# Patient Record
Sex: Female | Born: 1945 | Race: White | Hispanic: No | Smoking: Never smoker
Health system: Southern US, Community
[De-identification: ages and names within clinical notes are randomized; demographics above are authoritative.]

## PROBLEM LIST (undated history)

## (undated) DIAGNOSIS — N200 Calculus of kidney: Secondary | ICD-10-CM

## (undated) DIAGNOSIS — Z87442 Personal history of urinary calculi: Secondary | ICD-10-CM

## (undated) DIAGNOSIS — I471 Supraventricular tachycardia: Principal | ICD-10-CM

## (undated) DIAGNOSIS — I499 Cardiac arrhythmia, unspecified: Secondary | ICD-10-CM

## (undated) DIAGNOSIS — R413 Other amnesia: Secondary | ICD-10-CM

## (undated) DIAGNOSIS — M1990 Unspecified osteoarthritis, unspecified site: Secondary | ICD-10-CM

## (undated) DIAGNOSIS — R87610 Atypical squamous cells of undetermined significance on cytologic smear of cervix (ASC-US): Secondary | ICD-10-CM

## (undated) DIAGNOSIS — Z860100 Personal history of colon polyps, unspecified: Secondary | ICD-10-CM

## (undated) DIAGNOSIS — E039 Hypothyroidism, unspecified: Secondary | ICD-10-CM

## (undated) DIAGNOSIS — K7689 Other specified diseases of liver: Secondary | ICD-10-CM

## (undated) HISTORY — DX: Other amnesia: R41.3

## (undated) HISTORY — DX: Personal history of colonic polyps: Z86.010

## (undated) HISTORY — PX: SMALL INTESTINE SURGERY: SHX150

## (undated) HISTORY — DX: Hypothyroidism, unspecified: E03.9

## (undated) HISTORY — DX: Personal history of colon polyps, unspecified: Z86.0100

## (undated) HISTORY — PX: MM BREAST STEREO BIOPSY LEFT (ARMC HX): HXRAD1824

## (undated) HISTORY — PX: BACK SURGERY: SHX140

## (undated) HISTORY — DX: Supraventricular tachycardia: I47.1

## (undated) HISTORY — DX: Calculus of kidney: N20.0

## (undated) HISTORY — DX: Atypical squamous cells of undetermined significance on cytologic smear of cervix (ASC-US): R87.610

## (undated) HISTORY — DX: Other specified diseases of liver: K76.89

## (undated) HISTORY — DX: Unspecified osteoarthritis, unspecified site: M19.90

## (undated) HISTORY — PX: ABLATION: SHX5711

---

## 1998-06-20 ENCOUNTER — Other Ambulatory Visit: Admission: RE | Admit: 1998-06-20 | Discharge: 1998-06-20 | Payer: Self-pay | Admitting: *Deleted

## 1998-12-31 ENCOUNTER — Other Ambulatory Visit: Admission: RE | Admit: 1998-12-31 | Discharge: 1998-12-31 | Payer: Self-pay | Admitting: *Deleted

## 1999-01-02 ENCOUNTER — Other Ambulatory Visit: Admission: RE | Admit: 1999-01-02 | Discharge: 1999-01-02 | Payer: Self-pay | Admitting: Obstetrics and Gynecology

## 1999-06-26 ENCOUNTER — Other Ambulatory Visit: Admission: RE | Admit: 1999-06-26 | Discharge: 1999-06-26 | Payer: Self-pay | Admitting: *Deleted

## 2000-01-29 ENCOUNTER — Other Ambulatory Visit: Admission: RE | Admit: 2000-01-29 | Discharge: 2000-01-29 | Payer: Self-pay | Admitting: *Deleted

## 2000-09-09 ENCOUNTER — Other Ambulatory Visit: Admission: RE | Admit: 2000-09-09 | Discharge: 2000-09-09 | Payer: Self-pay | Admitting: *Deleted

## 2000-12-29 ENCOUNTER — Other Ambulatory Visit: Admission: RE | Admit: 2000-12-29 | Discharge: 2000-12-29 | Payer: Self-pay | Admitting: Radiology

## 2000-12-29 ENCOUNTER — Encounter (INDEPENDENT_AMBULATORY_CARE_PROVIDER_SITE_OTHER): Payer: Self-pay

## 2001-04-01 ENCOUNTER — Other Ambulatory Visit: Admission: RE | Admit: 2001-04-01 | Discharge: 2001-04-01 | Payer: Self-pay | Admitting: *Deleted

## 2001-10-03 ENCOUNTER — Other Ambulatory Visit: Admission: RE | Admit: 2001-10-03 | Discharge: 2001-10-03 | Payer: Self-pay | Admitting: *Deleted

## 2001-12-06 ENCOUNTER — Encounter: Payer: Self-pay | Admitting: Neurosurgery

## 2001-12-08 ENCOUNTER — Inpatient Hospital Stay (HOSPITAL_COMMUNITY): Admission: RE | Admit: 2001-12-08 | Discharge: 2001-12-11 | Payer: Self-pay | Admitting: Neurosurgery

## 2001-12-08 ENCOUNTER — Encounter: Payer: Self-pay | Admitting: Neurosurgery

## 2002-03-01 ENCOUNTER — Other Ambulatory Visit: Admission: RE | Admit: 2002-03-01 | Discharge: 2002-03-01 | Payer: Self-pay | Admitting: *Deleted

## 2002-03-21 ENCOUNTER — Encounter: Admission: RE | Admit: 2002-03-21 | Discharge: 2002-04-06 | Payer: Self-pay | Admitting: Neurosurgery

## 2002-10-06 ENCOUNTER — Ambulatory Visit (HOSPITAL_COMMUNITY): Admission: RE | Admit: 2002-10-06 | Discharge: 2002-10-06 | Payer: Self-pay | Admitting: Gastroenterology

## 2002-10-06 ENCOUNTER — Encounter (INDEPENDENT_AMBULATORY_CARE_PROVIDER_SITE_OTHER): Payer: Self-pay | Admitting: Specialist

## 2002-11-07 ENCOUNTER — Inpatient Hospital Stay (HOSPITAL_COMMUNITY): Admission: AD | Admit: 2002-11-07 | Discharge: 2002-11-11 | Payer: Self-pay | Admitting: Internal Medicine

## 2002-11-07 ENCOUNTER — Encounter: Payer: Self-pay | Admitting: Internal Medicine

## 2003-06-25 ENCOUNTER — Encounter: Admission: RE | Admit: 2003-06-25 | Discharge: 2003-06-25 | Payer: Self-pay | Admitting: Gastroenterology

## 2003-07-14 HISTORY — PX: PARTIAL COLECTOMY: SHX5273

## 2003-07-23 ENCOUNTER — Encounter (INDEPENDENT_AMBULATORY_CARE_PROVIDER_SITE_OTHER): Payer: Self-pay | Admitting: *Deleted

## 2003-07-23 ENCOUNTER — Inpatient Hospital Stay (HOSPITAL_COMMUNITY): Admission: RE | Admit: 2003-07-23 | Discharge: 2003-07-27 | Payer: Self-pay | Admitting: General Surgery

## 2004-06-10 ENCOUNTER — Emergency Department (HOSPITAL_COMMUNITY): Admission: EM | Admit: 2004-06-10 | Discharge: 2004-06-10 | Payer: Self-pay | Admitting: Family Medicine

## 2006-03-01 ENCOUNTER — Encounter: Admission: RE | Admit: 2006-03-01 | Discharge: 2006-03-01 | Payer: Self-pay | Admitting: Internal Medicine

## 2006-05-17 ENCOUNTER — Encounter: Admission: RE | Admit: 2006-05-17 | Discharge: 2006-05-17 | Payer: Self-pay | Admitting: Internal Medicine

## 2006-05-31 ENCOUNTER — Encounter (INDEPENDENT_AMBULATORY_CARE_PROVIDER_SITE_OTHER): Payer: Self-pay | Admitting: *Deleted

## 2006-05-31 ENCOUNTER — Other Ambulatory Visit: Admission: RE | Admit: 2006-05-31 | Discharge: 2006-05-31 | Payer: Self-pay | Admitting: Interventional Radiology

## 2006-05-31 ENCOUNTER — Encounter: Admission: RE | Admit: 2006-05-31 | Discharge: 2006-05-31 | Payer: Self-pay | Admitting: Internal Medicine

## 2006-06-21 ENCOUNTER — Encounter: Admission: RE | Admit: 2006-06-21 | Discharge: 2006-06-21 | Payer: Self-pay | Admitting: Internal Medicine

## 2006-07-08 ENCOUNTER — Encounter (INDEPENDENT_AMBULATORY_CARE_PROVIDER_SITE_OTHER): Payer: Self-pay | Admitting: *Deleted

## 2006-07-08 ENCOUNTER — Ambulatory Visit (HOSPITAL_COMMUNITY): Admission: RE | Admit: 2006-07-08 | Discharge: 2006-07-08 | Payer: Self-pay | Admitting: *Deleted

## 2006-07-13 HISTORY — PX: THYROIDECTOMY: SHX17

## 2006-08-26 ENCOUNTER — Ambulatory Visit (HOSPITAL_COMMUNITY): Admission: RE | Admit: 2006-08-26 | Discharge: 2006-08-27 | Payer: Self-pay | Admitting: Surgery

## 2006-08-26 ENCOUNTER — Encounter (INDEPENDENT_AMBULATORY_CARE_PROVIDER_SITE_OTHER): Payer: Self-pay | Admitting: Specialist

## 2007-09-11 LAB — CONVERTED CEMR LAB: Pap Smear: NORMAL

## 2008-06-25 ENCOUNTER — Encounter (INDEPENDENT_AMBULATORY_CARE_PROVIDER_SITE_OTHER): Payer: Self-pay | Admitting: *Deleted

## 2008-07-02 ENCOUNTER — Ambulatory Visit: Payer: Self-pay | Admitting: Family Medicine

## 2008-07-02 DIAGNOSIS — E039 Hypothyroidism, unspecified: Secondary | ICD-10-CM | POA: Insufficient documentation

## 2008-07-02 DIAGNOSIS — M1990 Unspecified osteoarthritis, unspecified site: Secondary | ICD-10-CM | POA: Insufficient documentation

## 2008-07-02 DIAGNOSIS — Z87442 Personal history of urinary calculi: Secondary | ICD-10-CM | POA: Insufficient documentation

## 2008-07-05 ENCOUNTER — Ambulatory Visit: Payer: Self-pay | Admitting: Family Medicine

## 2008-07-05 LAB — CONVERTED CEMR LAB: LDL Cholesterol: 148 mg/dL

## 2008-07-09 LAB — CONVERTED CEMR LAB
ALT: 22 units/L (ref 0–35)
AST: 19 units/L (ref 0–37)
Albumin: 4 g/dL (ref 3.5–5.2)
Alkaline Phosphatase: 56 units/L (ref 39–117)
BUN: 22 mg/dL (ref 6–23)
Bilirubin, Direct: 0.1 mg/dL (ref 0.0–0.3)
CO2: 26 meq/L (ref 19–32)
Calcium: 9.4 mg/dL (ref 8.4–10.5)
Chloride: 106 meq/L (ref 96–112)
Cholesterol, target level: 200 mg/dL
Cholesterol: 227 mg/dL (ref 0–200)
Creatinine, Ser: 0.8 mg/dL (ref 0.4–1.2)
Direct LDL: 148 mg/dL
Free T4: 1.3 ng/dL (ref 0.6–1.6)
GFR calc Af Amer: 93 mL/min
GFR calc non Af Amer: 77 mL/min
Glucose, Bld: 110 mg/dL — ABNORMAL HIGH (ref 70–99)
HDL goal, serum: 40 mg/dL
HDL: 54.2 mg/dL (ref >39.0)
LDL Goal: 160 mg/dL
Potassium: 4.5 meq/L (ref 3.5–5.1)
Sodium: 141 meq/L (ref 135–145)
T4, Total: 8.6 ug/dL (ref 5.0–12.5)
TSH: 0.63 microintl units/mL (ref 0.35–5.50)
Total Bilirubin: 0.8 mg/dL (ref 0.3–1.2)
Total CHOL/HDL Ratio: 4.2
Total Protein: 6.4 g/dL (ref 6.0–8.3)
Triglycerides: 70 mg/dL (ref 0–149)
VLDL: 14 mg/dL (ref 0–40)

## 2008-09-10 LAB — CONVERTED CEMR LAB: Pap Smear: NORMAL

## 2008-12-19 ENCOUNTER — Ambulatory Visit: Payer: Self-pay | Admitting: Family Medicine

## 2008-12-19 DIAGNOSIS — E785 Hyperlipidemia, unspecified: Secondary | ICD-10-CM | POA: Insufficient documentation

## 2008-12-20 LAB — CONVERTED CEMR LAB
Cholesterol: 200 mg/dL (ref 0–200)
HDL: 55.1 mg/dL (ref >39.00)
LDL Cholesterol: 131 mg/dL — ABNORMAL HIGH (ref 0–99)
Total CHOL/HDL Ratio: 4
Triglycerides: 71 mg/dL (ref 0.0–149.0)
VLDL: 14.2 mg/dL (ref 0.0–40.0)

## 2009-06-13 ENCOUNTER — Ambulatory Visit: Payer: Self-pay | Admitting: Family Medicine

## 2009-06-14 LAB — CONVERTED CEMR LAB
BUN: 21 mg/dL (ref 6–23)
CO2: 29 meq/L (ref 19–32)
Calcium: 9.1 mg/dL (ref 8.4–10.5)
Chloride: 107 meq/L (ref 96–112)
Cholesterol: 217 mg/dL — ABNORMAL HIGH (ref 0–200)
Creatinine, Ser: 0.9 mg/dL (ref 0.4–1.2)
Direct LDL: 151.7 mg/dL
Free T4: 1.1 ng/dL (ref 0.6–1.6)
GFR calc non Af Amer: 66.68 mL/min (ref >60)
Glucose, Bld: 110 mg/dL — ABNORMAL HIGH (ref 70–99)
HDL: 53.6 mg/dL (ref >39.00)
Potassium: 4.6 meq/L (ref 3.5–5.1)
Sodium: 142 meq/L (ref 135–145)
T4, Total: 7.2 ug/dL (ref 5.0–12.5)
TSH: 0.17 microintl units/mL — ABNORMAL LOW (ref 0.35–5.50)
Total CHOL/HDL Ratio: 4
Triglycerides: 84 mg/dL (ref 0.0–149.0)
VLDL: 16.8 mg/dL (ref 0.0–40.0)

## 2009-06-19 ENCOUNTER — Ambulatory Visit: Payer: Self-pay | Admitting: Family Medicine

## 2009-08-16 ENCOUNTER — Telehealth (INDEPENDENT_AMBULATORY_CARE_PROVIDER_SITE_OTHER): Payer: Self-pay

## 2009-08-23 ENCOUNTER — Ambulatory Visit: Payer: Self-pay | Admitting: Family Medicine

## 2009-08-26 LAB — CONVERTED CEMR LAB
Free T4: 1.1 ng/dL (ref 0.6–1.6)
TSH: 0.61 microintl units/mL (ref 0.35–5.50)

## 2009-08-29 ENCOUNTER — Ambulatory Visit: Payer: Self-pay | Admitting: Family Medicine

## 2009-08-29 DIAGNOSIS — G4700 Insomnia, unspecified: Secondary | ICD-10-CM | POA: Insufficient documentation

## 2009-09-05 ENCOUNTER — Ambulatory Visit: Payer: Self-pay | Admitting: Family Medicine

## 2009-11-08 ENCOUNTER — Ambulatory Visit: Payer: Self-pay | Admitting: Family Medicine

## 2010-04-18 ENCOUNTER — Ambulatory Visit: Payer: Self-pay | Admitting: Internal Medicine

## 2010-04-21 ENCOUNTER — Ambulatory Visit: Payer: Self-pay | Admitting: Family Medicine

## 2010-04-25 ENCOUNTER — Ambulatory Visit: Payer: Self-pay | Admitting: Family Medicine

## 2010-05-13 LAB — CONVERTED CEMR LAB: Pap Smear: NORMAL

## 2010-05-13 LAB — HM PAP SMEAR

## 2010-05-19 ENCOUNTER — Ambulatory Visit: Payer: Self-pay | Admitting: Family Medicine

## 2010-05-19 LAB — CONVERTED CEMR LAB
ALT: 26 units/L (ref 0–35)
AST: 21 units/L (ref 0–37)
Albumin: 4 g/dL (ref 3.5–5.2)
Alkaline Phosphatase: 59 units/L (ref 39–117)
BUN: 21 mg/dL (ref 6–23)
Basophils Absolute: 0 10*3/uL (ref 0.0–0.1)
Basophils Relative: 0.6 % (ref 0.0–3.0)
Bilirubin, Direct: 0.1 mg/dL (ref 0.0–0.3)
CO2: 29 meq/L (ref 19–32)
Calcium: 9.6 mg/dL (ref 8.4–10.5)
Chloride: 107 meq/L (ref 96–112)
Cholesterol: 224 mg/dL — ABNORMAL HIGH (ref 0–200)
Creatinine, Ser: 0.9 mg/dL (ref 0.4–1.2)
Direct LDL: 161.5 mg/dL
Eosinophils Absolute: 0.1 10*3/uL (ref 0.0–0.7)
Eosinophils Relative: 2.3 % (ref 0.0–5.0)
Free T4: 1.02 ng/dL (ref 0.60–1.60)
GFR calc non Af Amer: 68.23 mL/min (ref >60)
Glucose, Bld: 115 mg/dL — ABNORMAL HIGH (ref 70–99)
HCT: 44.2 % (ref 36.0–46.0)
HDL: 54.7 mg/dL (ref >39.00)
Hemoglobin: 15.2 g/dL — ABNORMAL HIGH (ref 12.0–15.0)
Lymphocytes Relative: 18.7 % (ref 12.0–46.0)
Lymphs Abs: 0.9 10*3/uL (ref 0.7–4.0)
MCHC: 34.5 g/dL (ref 30.0–36.0)
MCV: 95.8 fL (ref 78.0–100.0)
Monocytes Absolute: 0.4 10*3/uL (ref 0.1–1.0)
Monocytes Relative: 8.1 % (ref 3.0–12.0)
Neutro Abs: 3.5 10*3/uL (ref 1.4–7.7)
Neutrophils Relative %: 70.3 % (ref 43.0–77.0)
Platelets: 210 10*3/uL (ref 150.0–400.0)
Potassium: 5.2 meq/L — ABNORMAL HIGH (ref 3.5–5.1)
RBC: 4.61 M/uL (ref 3.87–5.11)
RDW: 12.7 % (ref 11.5–14.6)
Sodium: 143 meq/L (ref 135–145)
TSH: 0.95 microintl units/mL (ref 0.35–5.50)
Total Bilirubin: 0.6 mg/dL (ref 0.3–1.2)
Total CHOL/HDL Ratio: 4
Total Protein: 6.2 g/dL (ref 6.0–8.3)
Triglycerides: 75 mg/dL (ref 0.0–149.0)
VLDL: 15 mg/dL (ref 0.0–40.0)
WBC: 4.9 10*3/uL (ref 4.5–10.5)

## 2010-05-20 LAB — CONVERTED CEMR LAB: T4, Total: 7.4 ug/dL (ref 5.0–12.5)

## 2010-05-22 ENCOUNTER — Ambulatory Visit: Payer: Self-pay | Admitting: Family Medicine

## 2010-07-24 ENCOUNTER — Encounter: Payer: Self-pay | Admitting: Family Medicine

## 2010-07-24 ENCOUNTER — Ambulatory Visit
Admission: RE | Admit: 2010-07-24 | Discharge: 2010-07-24 | Payer: Self-pay | Source: Home / Self Care | Attending: Family Medicine | Admitting: Family Medicine

## 2010-07-24 DIAGNOSIS — F3342 Major depressive disorder, recurrent, in full remission: Secondary | ICD-10-CM

## 2010-07-24 DIAGNOSIS — M810 Age-related osteoporosis without current pathological fracture: Secondary | ICD-10-CM | POA: Insufficient documentation

## 2010-07-24 HISTORY — DX: Major depressive disorder, recurrent, in full remission: F33.42

## 2010-08-14 NOTE — Assessment & Plan Note (Signed)
Summary: Trouble sleeping/hmw   Vital Signs:  Patient profile:   65 year old female Height:      65 inches Weight:      164.2 pounds BMI:     27.42 Temp:     98.2 degrees F oral Pulse rate:   72 / minute Pulse rhythm:   regular BP sitting:   110 / 68  (left arm) Cuff size:   regular  Vitals Entered By: Benny Lennert CMA Duncan Dull) (August 29, 2009 10:25 AM)  History of Present Illness: Chief complaint trouble sleeping  65 year old female:  L 3rd finger with some rash - put some Vicks vaporub on it. this is been present for approximately 3 weeks. She has no known new exposures. This is not itching. It is approximately the size of a dime. There is some scale present.  Good night will sleep 6 hours, a and a bad night will be four Tried some Tylenol PM but will wake up drwosy in the AM. patient does watch TV in bed. She has taken some Benadryl or Tylenol PM approximately 1/2 tablets at nighttime, this does leave her drowsy in the morning.  Allergies (verified): No Known Drug Allergies  Past History:  Past medical, surgical, family and social histories (including risk factors) reviewed, and no changes noted (except as noted below).  Past Medical History: Reviewed history from 07/02/2008 and no changes required. Hypothyroidism Nephrolithiasis, hx of Osteoarthritis h/o Colon polyps  OB/GYN - Noland Fordyce, [facility] OB Urology - Larey Dresser, [facility]  Past Surgical History: Reviewed history from 07/02/2008 and no changes required. 3 back surgeries, 1982, 1987, 2003 Partial colectomy, 2005 Thyroidectomy, 2008  Family History: Reviewed history from 07/02/2008 and no changes required. M, d/c Alz, CVA F, d/c 89 natural causes  OA CAD, others Colon CA  Social History: Reviewed history from 07/02/2008 and no changes required. Divorced 40 years 1 daughter, no grandchildren Retired [employer]. Never  Smoked Alcohol use-no Drug use-no Regular exercise-yes  Review of Systems       ROS: GEN: No acute illnesses, no fevers, chills, sweats, fatigue, weight loss, or URI sx. GI: No n/v/d Pulm: No SOB, cough, wheezing Interactive and getting along well at home.  Otherwise, ROS is as per the HPI.   Physical Exam  General:  GEN: Well-developed,well-nourished,in no acute distress; alert,appropriate and cooperative throughout examination HEENT: Normocephalic and atraumatic without obvious abnormalities. No apparent alopecia or balding. Ears, externally no deformities PULM: Breathing comfortably in no respiratory distress EXT: No clubbing, cyanosis, or edema PSYCH: Normally interactive. Cooperative during the interview. Pleasant. Friendly and conversant. Not anxious or depressed appearing. Normal, full affect.  Skin:  left third finger, dorsum, with an area approximately the size of a dime with some scaling present. There is no evidence for significant excoriation. Cervical Nodes:  No lymphadenopathy noted   Impression & Recommendations:  Problem # 1:  INSOMNIA (ICD-780.52) Assessment New  Discussed sleep hygiene.   trazodone p.o. p.r.n.  Problem # 2:  RASH-NONVESICULAR (ICD-782.1) Assessment: New scaly rash, nonpruritic. Looks most consistent with a fungal rash, topical clotrimazole, and if no improvement three out of four weeks, it is reasonable to try topical steroids  Complete Medication List: 1)  Levothyroxine Sodium 88 Mcg Tabs (Levothyroxine sodium) .[name].. 1 by mouth daily 2)  Trazodone Hcl 100 Mg Tabs (Trazodone hcl) .[name].. 1 by mouth at bedtime as needed insomnia  Patient Instructions: 1)  Melatonin 3-5 mg by mouth before  bed, 1 hour before 2)  Over the counter, CLOTRIMAZOLE twice a day Prescriptions: TRAZODONE HCL 100 MG TABS (TRAZODONE HCL) 1 by mouth at bedtime as needed insomnia  #30 x 3   Entered and Authorized by:   Hannah Beat MD   Signed by:   Hannah Beat  MD on 08/29/2009   Method used:   Electronically to        [facility]* (retail)       [address]       [place], [place]  [address]       Ph: [phone]       Fax: [phone]   RxID:   [id number]   Current Allergies (reviewed today): No known allergies

## 2010-08-14 NOTE — Assessment & Plan Note (Signed)
Summary: FOLLOWUP  STITCHES... CYD   Allergies: 1)  ! Asa   Impression & Recommendations:  Problem # 1:  WOUND, OPEN, ARM, WITHOUT COMPLICATION (ICD-884.0) Assessment Improved  Suture repair, some opening proximally with good granulation tissue. no evidence of warmth, redness or infection. reassured pt. f/u Friday with Dr. Reece Agar for suture removal (Dr. Salena Saner on vacation)  discussed indications for eval over weekend (infection), o/w f/u Monday.  Last tetanus 2003 - she declines Tdap for pertussis  Orders: No Charge Patient Arrived (NCPA0) (NCPA0)  Complete Medication List: 1)  Levothyroxine Sodium 88 Mcg Tabs (Levothyroxine sodium) .[name].. 1 by mouth daily 2)  Glucosamine-chondroitin 500-400 Mg Caps (Glucosamine-chondroitin) .... One daily 3)  Calcium + D 600-200 Mg-unit Tabs (Calcium carbonate-vitamin d) .... Two a day 4)  Fish Oil 1000 Mg Caps (Omega-3 fatty acids) .... Two a day 5)  Keflex 500 Mg Caps (Cephalexin) .... Take one two times a day x 10 days

## 2010-08-14 NOTE — Assessment & Plan Note (Signed)
Summary: CHECK KNOT ON BACK OF NECK/CLE   Vital Signs:  Patient profile:   65 year old female Height:      65 inches Weight:      164.2 pounds BMI:     27.42 Temp:     97.9 degrees F oral Pulse rate:   72 / minute Pulse rhythm:   regular BP sitting:   130 / 80  (left arm) Cuff size:   regular  Vitals Entered By: Benny Lennert CMA Duncan Dull) (September 05, 2009 3:36 PM) 2  History of Present Illness: Chief complaint check know on back of neck  Boil, posterior neck in hairline  the last few days, the patient has had a very painful area in the posterior  neck, just caudal to the hairline. This is tender, is approximately the size of a dime, it is red. There's not been any pus that is come from this lesion.  She has not had any fever, chills, sweats or any systemic  symptoms.   ros: otherwise, the patient generally feels okay. She has no cough, rhinorrhea,  URI symptoms, flu symptoms, nausea, vomiting, diarrhea.  She feels generally a cane is breathing normally.     Allergies (verified): No Known Drug Allergies  Past History:  Past medical, surgical, family and social histories (including risk factors) reviewed, and no changes noted (except as noted below).  Past Medical History: Reviewed history from 07/02/2008 and no changes required. Hypothyroidism Nephrolithiasis, hx of Osteoarthritis h/o Colon polyps  OB/GYN - Noland Fordyce, [facility] OB Urology - Larey Dresser, [facility]  Past Surgical History: Reviewed history from 07/02/2008 and no changes required. 3 back surgeries, 1982, 1987, 2003 Partial colectomy, 2005 Thyroidectomy, 2008  Family History: Reviewed history from 07/02/2008 and no changes required. M, d/c Alz, CVA F, d/c 89 natural causes  OA CAD, others Colon CA  Social History: Reviewed history from 07/02/2008 and no changes required. Divorced 40 years 1 daughter, no grandchildren Retired [employer]. Never Smoked Alcohol use-no Drug use-no Regular exercise-yes  Physical Exam  General:  Well-developed,well-nourished,in no acute distress; alert,appropriate and cooperative throughout examination Head:  normocephalic and atraumatic.   Eyes:  vision grossly intact.   Ears:  no external deformities.   Nose:  no external deformity.   Lungs:  normal respiratory effort.   Extremities:  no edema Neurologic:  alert & oriented X3 and gait normal.   Skin:  posterior, medial aspect of the neck, approximately 1 cm above the hairline in the back there is an elevated  indurated lesion  that is red.. No fluctuance is appreciated. Cervical Nodes:  No lymphadenopathy noted Psych:  Cognition and judgment appear intact. Alert and cooperative with normal attention span and concentration. No apparent delusions, illusions, hallucinations   Impression & Recommendations:  Problem # 1:  CELLULITIS AND ABSCESS OF NECK (ICD-682.1) Assessment New  High risk location in nape of neck I and D and ABX with MRSA coverage  Procedure, Incision and drainage: the patient was prepped with  alcohol x5, subsequently to  2 cc of  lidocaine 2% was used to anesthetize the area in a wheal  fashion. good anesthesia was achieved.  Subsequently the area was reprepped, and an 18-gauge needle and a 3 cc syringe was used to aspirate the area..  Some small degree of purulent material was aspirated.  Subsequently, a #11 blade was used to enter the  abscess, open abcess, and express slightly more purulent material. Overall, less  than 1 cc was expressed.  Good hemostasis with pressure.  f/u if symptoms return or worsen.  Her updated medication list for this problem includes:    Septra Ds 800-160 Mg Tabs (Sulfamethoxazole-trimethoprim) .[name]... 2 by mouth two times a day  Orders: I&D Abscess, Simple / Single (10060)  Complete Medication List: 1)  Levothyroxine Sodium 88 Mcg Tabs (Levothyroxine sodium) .[name].. 1 by mouth  daily 2)  Trazodone Hcl 100 Mg Tabs (Trazodone hcl) .[name].. 1 by mouth at bedtime as needed insomnia 3)  Septra Ds 800-160 Mg Tabs (Sulfamethoxazole-trimethoprim) .... 2 by mouth two times a day Prescriptions: SEPTRA DS 800-160 MG TABS (SULFAMETHOXAZOLE-TRIMETHOPRIM) 2 by mouth two times a day  #28 x 0   Entered and Authorized by:   Hannah Beat MD   Signed by:   Hannah Beat MD on 09/05/2009   Method used:   Electronically to        [facility]* (retail)       [address]       [place], [place]  [address]       Ph: [phone]       Fax: [phone]   RxID:   [id number]   Current Allergies (reviewed today): No known allergies

## 2010-08-14 NOTE — Assessment & Plan Note (Signed)
Summary: REMOVE STICHES PER DR.COPLAND/JRR   Vital Signs:  Patient profile:   65 year old female Weight:      166.75 pounds Temp:     98.2 degrees F oral Pulse rate:   60 / minute Pulse rhythm:   regular BP sitting:   132 / 80  (left arm) Cuff size:   large  Vitals Entered By: Selena Batten Dance CMA Duncan Dull) (April 25, 2010 11:49 AM) CC: Remove stiches   Allergies: 1)  ! Asa  Physical Exam  General:  Well-developed,well-nourished,in no acute distress; alert,appropriate and cooperative throughout examination   Impression & Recommendations:  Problem # 1:  WOUND, OPEN, ARM, WITHOUT COMPLICATION (ICD-884.0) Sutures removed x 5.  pt tolerated well.  red flags for infection discussed.  good granulation tissue laterally where abrasion was.  dog ear medial flap proximally looking intact, distally dark, may not take.  return as needed.  advised to keep covered, clean and dry.  Orders: No Charge Patient Arrived (NCPA0) (NCPA0)  Complete Medication List: 1)  Levothyroxine Sodium 88 Mcg Tabs (Levothyroxine sodium) .[name].. 1 by mouth daily 2)  Glucosamine-chondroitin 500-400 Mg Caps (Glucosamine-chondroitin) .... One daily 3)  Calcium + D 600-200 Mg-unit Tabs (Calcium carbonate-vitamin d) .... Two a day 4)  Fish Oil 1000 Mg Caps (Omega-3 fatty acids) .... Two a day 5)  Keflex 500 Mg Caps (Cephalexin) .... Take one two times a day x 10 days  Patient Instructions: 1)  stitches removed.  keep area clean and dry.  good to see you today. 2)  call us if redness worsening or if starts draining pus.  Current Allergies (reviewed today): ! ASA

## 2010-08-14 NOTE — Assessment & Plan Note (Signed)
Summary: DEPRESSION,CONGESTION/CLE   Vital Signs:  Patient profile:   65 year old female Height:      65 inches Weight:      165.50 pounds BMI:     27.64 Temp:     98.2 degrees F oral Pulse rate:   64 / minute Pulse rhythm:   regular BP sitting:   120 / 78  (left arm) Cuff size:   regular  Vitals Entered By: Benny Lennert CMA Duncan Dull) (July 24, 2010 4:00 PM)  History of Present Illness: Chief complaint depression and congestion  65 year old female:  Feels depressed and anxious.  family problems will drop off to sleep, on and off, not sleeping good at all, some days feels exhausted.  normally very active person, decreaseed interest and energy right now compared to baseline.  Yesterday did not want to  do anything.   50/50 - wanting to do things No guilt  no hosp in the past  antidepressants, took some prozac, took for about six months. then happened for about three years later.  About four months. Took something. not sure what.    Acute Visit History:      The patient complains of cough, headache, nasal discharge, and sinus problems.  These symptoms began 1 week ago.  She complains of sinus pressure, teeth aching, ears being blocked, nasal congestion, and frontal headache.        Urine output has been normal.  She is tolerating clear liquids.        Allergies: 1)  ! Asa  Past History:  Past medical, surgical, family and social histories (including risk factors) reviewed, and no changes noted (except as noted below).  Past Medical History: Hypothyroidism Nephrolithiasis, hx of Osteoarthritis h/o Colon polyps Osteoporosis  Past Surgical History: Reviewed history from 07/02/2008 and no changes required. 3 back surgeries, 1982, 1987, 2003 Partial colectomy, 2005 Thyroidectomy, 2008  Past History:  Care Management: Gynecology: Dr. Ernestina Penna, [facility] Urology: Dr. Vonita Moss  Family History: Reviewed history from 07/02/2008 and  no changes required. M, d/c Alz, CVA F, d/c 89 natural causes  OA CAD, others Colon CA  Social History: Reviewed history from 07/02/2008 and no changes required. Divorced 40 years 1 daughter, no grandchildren Retired [employer]. Never Smoked Alcohol use-no Drug use-no Regular exercise-yes  Review of Systems       REVIEW OF SYSTEMS GEN: Acute illness details above. CV: No chest pain or SOB GI: No noted N or V Otherwise, pertinent positives and negatives are noted in the HPI.   Physical Exam  General:  Well-developed,well-nourished,in no acute distress; alert,appropriate and cooperative throughout examination Head:  Normocephalic and atraumatic without obvious abnormalities. No apparent alopecia or balding. mild ttp frontal sinuses. Ears:  External ear exam shows no significant lesions or deformities.  Otoscopic examination reveals clear canals, tympanic membranes are intact bilaterally without bulging, retraction, inflammation or discharge. Hearing is grossly normal bilaterally. Mouth:  Oral mucosa and oropharynx without lesions or exudates.  Teeth in good repair. Neck:  No deformities, masses, or tenderness noted. Lungs:  Normal respiratory effort, chest expands symmetrically. Lungs are clear to auscultation, no crackles or wheezes. Heart:  Normal rate and regular rhythm. S1 and S2 normal without gallop, murmur, click, rub or other extra sounds. Extremities:  No clubbing, cyanosis, edema, or deformity noted with normal full range of motion of all joints.   Neurologic:  alert & oriented X3 and gait normal.   Psych:  Cognition  and judgment appear intact. Alert and cooperative with normal attention span and concentration. No apparent delusions, illusions, hallucinations   Impression & Recommendations:  Problem # 1:  SINUSITIS - ACUTE-NOS (ICD-461.9) Assessment New acute sinusitis with ongoing chronic allergies  The following medications were removed from  the medication list:    Amoxicillin 500 Mg Tabs (Amoxicillin) .Marland KitchenMarland KitchenMarland KitchenMarland Kitchen 3 tabs by mouth two times a day (high dose sinusitis) Her updated medication list for this problem includes:    Fluticasone Propionate 50 Mcg/act Susp (Fluticasone propionate) .[name]... 2 sprays each nostril once daily    Amoxicillin 875 Mg Tabs (Amoxicillin) .[name]... 1 by mouth two times a day  Problem # 2:  DEPRESSIVE DISORDER (ICD-311) Assessment: New patient clearly different from her baseline interactions with me. flat - not open for much discussion but no SI or HI.  not open to counselling at this point.  start ssri with good f/u  Her updated medication list for this problem includes:    Fluoxetine Hcl 20 Mg Caps (Fluoxetine hcl) .[name]... 1 by mouth daily  Complete Medication List: 1)  Levothyroxine Sodium 88 Mcg Tabs (Levothyroxine sodium) .[name].. 1 by mouth daily 2)  Glucosamine-chondroitin 500-400 Mg Caps (Glucosamine-chondroitin) .... One daily 3)  Calcium + D 600-200 Mg-unit Tabs (Calcium carbonate-vitamin d) .... Two a day 4)  Fish Oil 1000 Mg Caps (Omega-3 fatty acids) .... Two a day 5)  Fluoxetine Hcl 20 Mg Caps (Fluoxetine hcl) .[name].. 1 by mouth daily 6)  Fluticasone Propionate 50 Mcg/act Susp (Fluticasone propionate) .... 2 sprays each nostril once daily 7)  Amoxicillin 875 Mg Tabs (Amoxicillin) .[name].. 1 by mouth two times a day  Patient Instructions: 1)  ANTIHISTAMINES: 2)  CLARITIN, ZYRTEC (GENERIC) 3)  ALLEGRA 4)  AT NIGHT TIME - TAKE A BENADRYL TABLET (WILL MAKE YOU SLEEPY) 5)  recheck in 1 month Prescriptions: AMOXICILLIN 875 MG TABS (AMOXICILLIN) 1 by mouth two times a day  #20 x 0   Entered and Authorized by:   Hannah Beat MD   Signed by:   Hannah Beat MD on 07/24/2010   Method used:   Electronically to        [facility]* (retail)       [address]       [place], [place]  [address]       Ph: [phone]       Fax: [phone]   RxID:   [id number] FLUTICASONE PROPIONATE 50 MCG/ACT   SUSP (FLUTICASONE PROPIONATE) 2 sprays each nostril once daily  #1 vial x 11   Entered and Authorized by:   Hannah Beat MD   Signed by:   Hannah Beat MD on 07/24/2010   Method used:   Electronically to        [facility]* (retail)       [address]       [place], [place]  [address]       Ph: [phone]       Fax: [phone]   RxID:   [id number] FLUOXETINE HCL 20 MG CAPS (FLUOXETINE HCL) 1 by mouth daily  #30 x 2   Entered and Authorized by:   Hannah Beat MD   Signed by:   Hannah Beat MD on 07/24/2010   Method used:   Electronically to        [facility]* (retail)       [address]       [place], [place]  [address]       Ph: [phone]  Fax: [phone]   RxID:   [id number]    Orders Added: 1)  Est. Patient Level IV [401[address]]    Current Allergies (reviewed today): ! ASA

## 2010-08-14 NOTE — Assessment & Plan Note (Signed)
Summary: LACERATIION ON RIGHT ARM   Vital Signs:  Patient profile:   65 year old female Weight:      167 pounds Temp:     98.3 degrees F oral Pulse rate:   74 / minute Pulse rhythm:   regular BP sitting:   110 / 70  (left arm) Cuff size:   large  Vitals Entered By: Selena Batten Dance CMA (AAMA) (April 18, 2010 10:40 AM) CC: Abrasion to left arm   History of Present Illness: CC: cut left arm  sawing piece of plywood around 10:15am, piece of plywood came backwards and hit arm.  + bleeding previously but seems stopped with pressure.  Doesn't think any pieces got stuck in arm.  not currently on blood thinners.  no h/o DM, immunocompromise.  Current Medications (verified): 1)  Levothyroxine Sodium 88 Mcg Tabs (Levothyroxine Sodium) .[name].. 1 By Mouth Daily 2)  Glucosamine-Chondroitin 500-400 Mg Caps (Glucosamine-Chondroitin) .... One Daily 3)  Calcium + D 600-200 Mg-Unit Tabs (Calcium Carbonate-Vitamin D) .... Two A Day 4)  Fish Oil 1000 Mg Caps (Omega-3 Fatty Acids) .... Two A Day  Allergies: 1)  ! Jonne Ply  Past History:  Past Medical History: Last updated: 07/02/2008 Hypothyroidism Nephrolithiasis, hx of Osteoarthritis h/o Colon polyps  OB/GYN - Noland Fordyce, [facility] OB Urology - Larey Dresser, [facility]  Social History: Last updated: 07/02/2008 Divorced 40 years 1 daughter, no grandchildren Retired [employer]. Never Smoked Alcohol use-no Drug use-no Regular exercise-yes PMH-FH-[id number] reviewed for relevance  Review of Systems       per HPI  Physical Exam  General:  Well-developed,well-nourished,in no acute distress; alert,appropriate and cooperative throughout examination Msk:  FROM at wrist and digits, strength of flexion intact Pulses:  2+ rad pulses Neurologic:  sensation intact Skin:  R anterior forearm with 6cm lesion - 3cm lateral abrasion turning into 3cm laceration medially with dog ear posteriorly,  subcutaneous tissue exposed, skin peeled off.   Additional Exam:  area cleaned initially with H2O2 then with betadine.  anesthesia achieved with  ~ 5 cc 1% lido w/ epi buffered with NaHCO3.  lesion explored deep into sq tissue, no tendons visualized.  irrigated with betadine then normal saline.  one subcutaneous stitch placed - 3.0 vicryl, then 5 3.0 ethilon stitches used to approximate medial dog ear lac edges.  irrigated again with NS.  antibiotic ointment and gauze used to dress wound.  minmial blood loss.  pt tolerated procedure well   Impression & Recommendations:  Problem # 1:  WOUND, OPEN, ARM, WITHOUT COMPLICATION (ICD-884.0) given deep lac, put on keflex.  RTC 3days for f/u, 7 days for suture removal.  discussed indications for eval over weekend (infection), o/w f/u Monday.  Last tetanus 2003, so could give Tdap given depth of lac.   Orders: Lacerat Intermd STE 2.6 - 7.5 cm ([address])  Complete Medication List: 1)  Levothyroxine Sodium 88 Mcg Tabs (Levothyroxine sodium) .[name].. 1 by mouth daily 2)  Glucosamine-chondroitin 500-400 Mg Caps (Glucosamine-chondroitin) .... One daily 3)  Calcium + D 600-200 Mg-unit Tabs (Calcium carbonate-vitamin d) .... Two a day 4)  Fish Oil 1000 Mg Caps (Omega-3 fatty acids) .... Two a day 5)  Keflex 500 Mg Caps (Cephalexin) .... Take one two times a day x 10 days  Patient Instructions: 1)  5 stitches placed today. 2)  Return monday for recheck 3)  Start antibiotic - Keflex twice daily for 10 days 4)  Reasons to go seek urgent medical  care - spreading redness or swelling worse, pain not controlled with ibuprofen, fevers or draining pus. 5)  Call us iwth questions.  pleasure to see you today. Prescriptions: KEFLEX 500 MG CAPS (CEPHALEXIN) take one two times a day x 10 days  #20 x 0   Entered and Authorized by:   Eustaquio Boyden  MD   Signed by:   Eustaquio Boyden  MD on 04/18/2010   Method used:   Electronically to        [facility]* (retail)        [address]       [place], [place]  [address]       Ph: [phone]       Fax: [phone]   RxID:   [id number]   Current Allergies (reviewed today): ! ASA    Prevention & Chronic Care Immunizations   Influenza vaccine: Historical  (04/10/2009)    Tetanus booster: 07/13/2001: given   Tetanus booster due: 07/14/2011    Pneumococcal vaccine: Not documented    H. zoster vaccine: 07/13/2001: given  Colorectal Screening   Hemoccult: Not documented    Colonoscopy: normal  (07/13/2005)   Colonoscopy due: 07/13/2010  Other Screening   Pap smear: normal  (09/10/2008)   Pap smear due: 09/10/2009    Mammogram: normal  (04/12/2008)   Mammogram due: 04/12/2009    DXA bone density scan: Not documented   Smoking status: never  (07/02/2008)  Lipids   Total Cholesterol: 217  (06/13/2009)   LDL: 131  (12/19/2008)   LDL Direct: 151.7  (06/13/2009)   HDL: 53.60  (06/13/2009)   Triglycerides: 84.0  (06/13/2009)    SGOT (AST): 19  (07/05/2008)   SGPT (ALT): 22  (07/05/2008)   Alkaline phosphatase: 56  (07/05/2008)   Total bilirubin: 0.8  (07/05/2008)  Self-Management Support :    Lipid self-management support: Not documented

## 2010-08-14 NOTE — Progress Notes (Signed)
Summary: sinus  Phone Note Call from Patient Call back at [phone]   Caller: Patient Call For: Hannah Beat MD Summary of Call: Pt has had cold symptoms for 2 weeks. Today problem can't stop blowing nose and having alot of sinus drainage. No SOB, no cough and occasional 99 fever on and off. Pt has tried Advil congested relief OTC this morning and that seems to help. Pt also trying cool air humidifier. Pt advised if condition changes or worsens can call back. Also gave option if in AM need be seen can go to [facility]. Pt was assured and seemed comfortable wilth plan. Initial call taken by: Lewanda Rife LPN,  August 16, 2009 12:11 PM

## 2010-08-14 NOTE — Assessment & Plan Note (Signed)
Summary: MED REFILL/RBH   Vital Signs:  Patient profile:   65 year old female Height:      65 inches Weight:      165.0 pounds BMI:     27.56 Temp:     98.0 degrees F oral Pulse rate:   60 / minute Pulse rhythm:   regular BP sitting:   110 / 80  (left arm) Cuff size:   large  Vitals Entered By: Benny Lennert CMA Duncan Dull) (May 22, 2010 11:50 AM)  History of Present Illness: Chief complaint med refill and ? sinus infection  Sinus infection  Hypothyroid, stable, on current meds  all labs reviewed  hyperlipidemia, FHx of CAD, multiple brothers. LDL > 160, chol > 220, not ready to take medication.  Acute Visit History:      The patient complains of headache, nasal discharge, and sinus problems.  These symptoms began 3 weeks ago.  She complains of sinus pressure, teeth aching, ears being blocked, nasal congestion, purulent drainage, and frontal headache.  The patient has had a past history of sinusitis.        Urine output has been normal.  She is tolerating clear liquids.        Clinical Review Panels:  Prevention   Last Mammogram:  normal (05/13/2010)   Last Pap Smear:  normal (05/13/2010)   Last Colonoscopy:  normal (07/13/2005)  Immunizations   Last Tetanus Booster:  given (07/13/2001)   Last Flu Vaccine:  given (03/13/2010)   Last Pneumovax:  given (05/22/2010)   Last Zoster Vaccine:  given (07/13/2001)  Lipid Management   Cholesterol:  224 (05/19/2010)   LDL (bad choesterol):  131 (12/19/2008)   HDL (good cholesterol):  54.70 (05/19/2010)  Diabetes Management   Creatinine:  0.9 (05/19/2010)   Last Flu Vaccine:  given (03/13/2010)   Last Pneumovax:  given (05/22/2010)  CBC   WBC:  4.9 (05/19/2010)   RBC:  4.61 (05/19/2010)   Hgb:  15.2 (05/19/2010)   Hct:  44.2 (05/19/2010)   Platelets:  210.0 (05/19/2010)   MCV  95.8 (05/19/2010)   MCHC  34.5 (05/19/2010)   RDW  12.7 (05/19/2010)   PMN:  70.3 (05/19/2010)   Lymphs:  18.7 (05/19/2010)   Monos:   8.1 (05/19/2010)   Eosinophils:  2.3 (05/19/2010)   Basophil:  0.6 (05/19/2010)  Complete Metabolic Panel   Glucose:  115 (05/19/2010)   Sodium:  143 (05/19/2010)   Potassium:  5.2 (05/19/2010)   Chloride:  107 (05/19/2010)   CO2:  29 (05/19/2010)   BUN:  21 (05/19/2010)   Creatinine:  0.9 (05/19/2010)   Albumin:  4.0 (05/19/2010)   Total Protein:  6.2 (05/19/2010)   Calcium:  9.6 (05/19/2010)   Total Bili:  0.6 (05/19/2010)   Alk Phos:  59 (05/19/2010)   SGPT (ALT):  26 (05/19/2010)   SGOT (AST):  21 (05/19/2010)   Allergies: 1)  ! Asa  Past History:  Past medical, surgical, family and social histories (including risk factors) reviewed, and no changes noted (except as noted below).  Past Medical History: Reviewed history from 07/02/2008 and no changes required. Hypothyroidism Nephrolithiasis, hx of Osteoarthritis h/o Colon polyps  OB/GYN - Noland Fordyce, [facility] OB Urology - Larey Dresser, [facility]  Past Surgical History: Reviewed history from 07/02/2008 and no changes required. 3 back surgeries, 1982, 1987, 2003 Partial colectomy, 2005 Thyroidectomy, 2008   Family History: Reviewed history from 07/02/2008 and no changes required. M, d/c Alz, CVA F, d/c  89 natural causes  OA CAD, others Colon CA  Social History: Reviewed history from 07/02/2008 and no changes required. Divorced 40 years 1 daughter, no grandchildren Retired [employer]. Never Smoked Alcohol use-no Drug use-no Regular exercise-yes  Review of Systems       REVIEW OF SYSTEMS GEN: Acute illness details above. CV: No chest pain or SOB GI: No noted N or V Otherwise, pertinent positives and negatives are noted in the HPI.   Physical Exam  Additional Exam:  Gen: WDWN, NAD; alert,appropriate and cooperative throughout exam  HEENT: Normocephalic and atraumatic. Throat clear, w/o exudate, no LAD, R TM clear, L TM - good landmarks,  No fluid present. rhinnorhea.  Left frontal and maxillary sinuses: Tender Right frontal and maxillary sinuses: Tender  Neck: No ant or post LAD  CV: RRR, No M/G/R  Pulm: Breathing comfortably in no resp distress. no w/c/r  Abd: S,NT,ND,+BS  Extr: no c/c/e  Psych: full affect, pleasant    Impression & Recommendations:  Problem # 1:  SINUSITIS - ACUTE-NOS (ICD-461.9) Assessment New  The following medications were removed from the medication list:    Keflex 500 Mg Caps (Cephalexin) .[name]... Take one two times a day x 10 days Her updated medication list for this problem includes:    Amoxicillin 500 Mg Tabs (Amoxicillin) .Marland KitchenMarland KitchenMarland KitchenMarland Kitchen 3 tabs by mouth two times a day (high dose sinusitis)  Instructed on treatment. Call if symptoms persist or worsen.   Orders: Prescription Created Electronically [phone])  Problem # 2:  HYPOTHYROIDISM (ICD-244.9) Assessment: Unchanged  Her updated medication list for this problem includes:    Levothyroxine Sodium 88 Mcg Tabs (Levothyroxine sodium) .[name]... 1 by mouth daily  Labs Reviewed: TSH: 0.95 (05/19/2010)   Total T4: 7.4 (05/19/2010)    Chol: 224 (05/19/2010)   HDL: 54.70 (05/19/2010)   LDL: 131 (12/19/2008)   TG: 75.0 (05/19/2010)  Problem # 3:  HYPERLIPIDEMIA (ICD-272.4) handout given detailed discussion, rec. statin, but pt. declined.  Labs Reviewed: SGOT: 21 (05/19/2010)   SGPT: 26 (05/19/2010)  Lipid Goals: Chol Goal: 200 (07/09/2008)   HDL Goal: 40 (07/09/2008)   LDL Goal: 160 (07/09/2008)   TG Goal: 150 (07/09/2008)  Prior 10 Yr Risk Heart Disease: 8 % (06/19/2009)   HDL:54.70 (05/19/2010), 53.60 (06/13/2009)  LDL:131 (12/19/2008), 148 (07/05/2008)  Chol:224 (05/19/2010), 217 (06/13/2009)  Trig:75.0 (05/19/2010), 84.0 (06/13/2009)  Complete Medication List: 1)  Levothyroxine Sodium 88 Mcg Tabs (Levothyroxine sodium) .[name].. 1 by mouth daily 2)  Glucosamine-chondroitin 500-400 Mg Caps (Glucosamine-chondroitin) .... One daily 3)  Calcium +  D 600-200 Mg-unit Tabs (Calcium carbonate-vitamin d) .... Two a day 4)  Fish Oil 1000 Mg Caps (Omega-3 fatty acids) .... Two a day 5)  Amoxicillin 500 Mg Tabs (Amoxicillin) .... 3 tabs by mouth two times a day (high dose sinusitis)  Contraindications/Deferment of Procedures/Staging:    Test/Procedure: Statin Declined    Reason for deferment: patient declined   Patient Instructions: 1)  HAVE A GREAT TIME IN [place]. Prescriptions: LEVOTHYROXINE SODIUM 88 MCG TABS (LEVOTHYROXINE SODIUM) 1 by mouth daily  #30 x 11   Entered and Authorized by:   Hannah Beat MD   Signed by:   Hannah Beat MD on 05/22/2010   Method used:   Electronically to        [facility]* (retail)       [address]       [place], [place]  [address]       Ph: [phone]  Fax: [phone]   RxID:   [id number] AMOXICILLIN 500 MG TABS (AMOXICILLIN) 3 tabs by mouth two times a day (high dose sinusitis)  #60 x 0   Entered and Authorized by:   Hannah Beat MD   Signed by:   Hannah Beat MD on 05/22/2010   Method used:   Electronically to        [facility]* (retail)       [address]       [place], [place]  [address]       Ph: [phone]       Fax: [phone]   RxID:   [id number]    Orders Added: 1)  Est. Patient Level IV [56433] 2)  Prescription Created Electronically [phone]    Current Allergies (reviewed today): ! ASA  Last Flu Vaccine:  Historical (04/10/2009 8:20:45 AM) Flu Vaccine Result Date:  03/13/2010 Flu Vaccine Result:  given Pneumovax Result Date:  05/22/2010 Pneumovax Result:  given Pneumovax Next Due:  5 yr Last PAP:  normal (09/10/2008 8:18:40 AM) PAP Result Date:  05/13/2010 PAP Result:  normal Last Mammogram:  normal (04/12/2008 2:29:20 PM) Mammogram Result Date:  05/13/2010 Mammogram Result:  normal      Appended Document: MED REFILL/RBH    Clinical Lists Changes  Orders: Added new Service order of Pneumococcal Vaccine (84166) -  Signed Added new Service order of Admin 1st Vaccine (06301) - Signed Observations: Added new observation of PNEUMOVAXVIS: 06/17/09 version given May 22, 2010. (05/22/2010 12:35) Added new observation of PNEUMOVAXLOT: 1258aa (05/22/2010 12:35) Added new observation of PNEUMOVAXEXP: 11/04/2011 (05/22/2010 12:35) Added new observation of PNEUMOVAXBY: Heather Woodard CMA (AAMA) (05/22/2010 12:35) Added new observation of PNEUMOVAXRTE: IM (05/22/2010 12:35) Added new observation of PNEUMOVAXDOS: 0.5 ml (05/22/2010 12:35) Added new observation of PNEUMOVAXMFR: Merck (05/22/2010 12:35) Added new observation of PNEUMOVAXSIT: right deltoid (05/22/2010 12:35) Added new observation of PNEUMOVAX: Pneumovax (Medicare) (05/22/2010 12:35)       Immunizations Administered:  Pneumonia Vaccine:    Vaccine Type: Pneumovax (Medicare)    Site: right deltoid    Mfr: Merck    Dose: 0.5 ml    Route: IM    Given by: Benny Lennert CMA (AAMA)    Exp. Date: 11/04/2011    Lot #: [id number]    VIS given: 06/17/09 version given May 22, 2010.

## 2010-08-14 NOTE — Assessment & Plan Note (Signed)
Summary: RATTLE IN CHEST   Vital Signs:  Patient profile:   65 year old female Height:      65 inches Weight:      167 pounds BMI:     27.89 O2 Sat:      98 % on Room air Temp:     97.9 degrees F oral Pulse rate:   72 / minute Pulse rhythm:   regular Resp:     20 per minute BP sitting:   116 / 72  (left arm) Cuff size:   regular  Vitals Entered By: Lewanda Rife LPN (November 08, 2009 8:28 AM)  O2 Flow:  Room air CC: rattle in chest, productive cough with yellow mucus. No SOB    History of Present Illness: rattle in chest for 4-5 days  no hx of bronchitis or asthma - never smoked and no chem exposure  works outside a lot -- has some mild pollen allergies   a little cough -- brought up a little yellow phlegm  no cold symptoms  no nasal symptoms/ throat is fine and no hoarseness no fever     Allergies (verified): 1)  ! Jonne Ply  Past History:  Past Medical History: Last updated: 07/02/2008 Hypothyroidism Nephrolithiasis, hx of Osteoarthritis h/o Colon polyps  OB/GYN - Noland Fordyce, [facility] OB Urology - Larey Dresser, [facility]  Past Surgical History: Last updated: 07/02/2008 3 back surgeries, 1982, 1987, 2003 Partial colectomy, 2005 Thyroidectomy, 2008  Family History: Last updated: 07/02/2008 M, d/c Alz, CVA F, d/c 89 natural causes  OA CAD, others Colon CA  Social History: Last updated: 07/02/2008 Divorced 40 years 1 daughter, no grandchildren Retired [employer]. Never Smoked Alcohol use-no Drug use-no Regular exercise-yes  Risk Factors: Exercise: yes (07/02/2008)  Risk Factors: Smoking Status: never (07/02/2008)  Review of Systems General:  Denies chills, fatigue, fever, loss of appetite, and malaise. Eyes:  Denies discharge and eye irritation. ENT:  Denies nasal congestion, postnasal drainage, sinus pressure, and sore throat. CV:  Denies chest pain or discomfort and palpitations. Resp:   Complains of cough and sputum productive; denies pleuritic, shortness of breath, and wheezing. GI:  Denies diarrhea, nausea, and vomiting. Derm:  Denies rash.  Physical Exam  General:  Well-developed,well-nourished,in no acute distress; alert,appropriate and cooperative throughout examination Head:  normocephalic, atraumatic, and no abnormalities observed.  no sinus tenderness  Eyes:  vision grossly intact, pupils equal, pupils round, pupils reactive to light, and no injection.   Ears:  R ear normal and L ear normal.   Nose:  no nasal discharge.  - boggy Mouth:  pharynx pink and moist.   Neck:  No deformities, masses, or tenderness noted. Lungs:  CTa with no rales or rhonchi  scant wheeze on forced exp only harsh cough- not wet sounding  no exp phase  Heart:  Normal rate and regular rhythm. S1 and S2 normal without gallop, murmur, click, rub or other extra sounds. Skin:  Intact without suspicious lesions or rashes Cervical Nodes:  No lymphadenopathy noted Psych:  normal affect, talkative and pleasant    Impression & Recommendations:  Problem # 1:  BRONCHITIS- ACUTE (ICD-466.0) Assessment New very mild and viral with wheeze only on forced exp and mildly prod cough adv use of expectorant with lots of water given proventil as needed if wheeze or tight chest recommend sympt care- see pt instructions   pt advised to update me if symptoms worsen or do not improve - esp if sob  or fever  The following medications were removed from the medication list:    Septra Ds 800-160 Mg Tabs (Sulfamethoxazole-trimethoprim) .[name]... 2 by mouth two times a day Her updated medication list for this problem includes:    Proventil Hfa 108 (90 Base) Mcg/act Aers (Albuterol sulfate) .[name]... 2 puffs up to every 4 hours as needed wheeze/ tight chest  Complete Medication List: 1)  Levothyroxine Sodium 88 Mcg Tabs (Levothyroxine sodium) .[name].. 1 by mouth daily 2)  Trazodone Hcl 100 Mg Tabs (Trazodone hcl) .[name].. 1 by  mouth at bedtime as needed insomnia 3)  Proventil Hfa 108 (90 Base) Mcg/act Aers (Albuterol sulfate) .... 2 puffs up to every 4 hours as needed wheeze/ tight chest  Patient Instructions: 1)  I think you have a viral bronchitis/ chest cold 2)  use mucinex over the counter to loosen phelgm  3)  drink lots of water 4)  use proventil inhaler if you feel wheezy or tight  5)  update me if worse or fever or other symptoms  6)  update me if not improving in a week  Prescriptions: PROVENTIL HFA 108 (90 BASE) MCG/ACT AERS (ALBUTEROL SULFATE) 2 puffs up to every 4 hours as needed wheeze/ tight chest  #1 mdi x 0   Entered and Authorized by:   Judith Part MD   Signed by:   Judith Part MD on 11/08/2009   Method used:   Print then Give to Patient   RxID:   [id number]   Current Allergies (reviewed today): ! ASA

## 2010-08-20 ENCOUNTER — Encounter: Payer: Self-pay | Admitting: Family Medicine

## 2010-08-20 ENCOUNTER — Ambulatory Visit (INDEPENDENT_AMBULATORY_CARE_PROVIDER_SITE_OTHER): Payer: Medicare Other | Admitting: Family Medicine

## 2010-08-20 DIAGNOSIS — F329 Major depressive disorder, single episode, unspecified: Secondary | ICD-10-CM

## 2010-08-20 DIAGNOSIS — F3289 Other specified depressive episodes: Secondary | ICD-10-CM

## 2010-08-28 ENCOUNTER — Telehealth: Payer: Self-pay | Admitting: Family Medicine

## 2010-08-28 NOTE — Assessment & Plan Note (Signed)
Summary: F/U/CLE   Vital Signs:  Patient profile:   65 year old female Height:      65 inches Weight:      167.50 pounds BMI:     27.97 Temp:     98.1 degrees F oral Pulse rate:   64 / minute Pulse rhythm:   regular BP sitting:   120 / 60  (left arm) Cuff size:   regular  Vitals Entered By: Benny Lennert CMA Duncan Dull) (August 20, 2010 11:23 AM)  History of Present Illness: Chief complaint follow up mood  65 year old female:  f/u dep:  still having some mood irritability and anxiety. Can tell a difference that she is better. Sleeping a little better. Normally up at 5:30, doing a lot better.    osteoporosis:   Allergies: 1)  ! Asa   Impression & Recommendations:  Problem # 1:  DEPRESSIVE DISORDER (ICD-311) >15 minutes spent in face to face time with patient, >50% spent in counselling or coordination of care: doing mostly better, decreased dep, still there, less mood variability, working out problems with family. Sleeping better, no si or hi. overall better, but not there yet.  mild st, l ear with serous fluid. sudafed and afrin for now.  Her updated medication list for this problem includes:    Fluoxetine Hcl 40 Mg Caps (Fluoxetine hcl) .[name]... 1 by mouth daily  Complete Medication List: 1)  Levothyroxine Sodium 88 Mcg Tabs (Levothyroxine sodium) .[name].. 1 by mouth daily 2)  Glucosamine-chondroitin 500-400 Mg Caps (Glucosamine-chondroitin) .... One daily 3)  Calcium + D 600-200 Mg-unit Tabs (Calcium carbonate-vitamin d) .... Two a day 4)  Fish Oil 1000 Mg Caps (Omega-3 fatty acids) .... Two a day 5)  Fluoxetine Hcl 40 Mg Caps (Fluoxetine hcl) .[name].. 1 by mouth daily 6)  Fluticasone Propionate 50 Mcg/act Susp (Fluticasone propionate) .... 2 sprays each nostril once daily  Patient Instructions: 1)  f/u 4-6 weeks.  Prescriptions: FLUOXETINE HCL 40 MG CAPS (FLUOXETINE HCL) 1 by mouth daily  #30 x 5   Entered and Authorized by:   Hannah Beat MD   Signed by:   Hannah Beat MD on 08/20/2010   Method used:   Electronically to        [facility]* (retail)       [address]       [place], [place]  [address]       Ph: [phone]       Fax: [phone]   RxID:   [id number]    Orders Added: 1)  Est. Patient Level III [84132]    Current Allergies (reviewed today): ! ASA

## 2010-09-03 NOTE — Progress Notes (Signed)
Summary: diarrhea with prozac  Phone Note Call from Patient Call back at [phone]   Caller: Patient Summary of Call: Pt has had diarrhea since prozac dose was increased.  She says she cant continue to take this dose.  She doesnt know what to do at this point.  She said she could go back to the lower dose, but that was not working well for her so dose was increased.  Please advise, [place]. Initial call taken by: Lowella Petties CMA, AAMA,  August 28, 2010 8:33 AM  Follow-up for Phone Call        called and discussed with her. symptoms have improved over the last 2 days. initially was having BM's 5-6 times a day, yest only twice. feels better with less nausea, stomache rumbling.  plan: cont with same prozac dose, if SE, expect stabilization within next 7-10 days. Possibly resolving GI illness. If continues may have to go to 20 mg prozac and add another agent. Follow-up by: Hannah Beat MD,  August 29, 2010 9:54 AM

## 2010-11-24 ENCOUNTER — Other Ambulatory Visit: Payer: Self-pay | Admitting: Gastroenterology

## 2010-11-24 LAB — HM COLONOSCOPY

## 2010-11-28 NOTE — Discharge Summary (Signed)
NAME:  Monica Irwin, Monica Irwin                       ACCOUNT NO.:  [id number]   MEDICAL RECORD NO.:  [id number]                   PATIENT TYPE:  INP   LOCATION:  5705                                 FACILITY:  [facility]   PHYSICIAN:  Darius Bump, M.D.             DATE OF BIRTH:  02-24-1946   DATE OF ADMISSION:  11/07/2002  DATE OF DISCHARGE:  11/11/2002                                 DISCHARGE SUMMARY   ADMISSION DIAGNOSES:  1. Severe left lower quadrant pain, diverticulitis versus ?appendicitis.  2. Depression.  3. Symptomatic gastroesophageal reflux disease with remote history of GU.  4. Degenerative disk disease.   DISCHARGE DIAGNOSES:  1. Diverticulitis.  2. History of gastroesophageal reflux disease with GU in the past.  3. Depression.  4. Degenerative disk disease in her back.  5. Hypoglycemia probably due to stress, resolved.   CONDITION ON DISCHARGE:  Improved.   CONSULTATIONS:  Gabrielle Dare. Janee Morn, M.D.   HOSPITAL COURSE:  The patient is a pleasant 65 year old woman who was  admitted through the office with 12 hours of worsening left lower quadrant  pain.  On admission she was afebrile.  Vital signs were stable.  Heart and  lung examination was unremarkable.  Abdomen had significant tenderness in  the left lower quadrant with evidence of guarding.  Pelvic and rectal  examination were unremarkable.  She was admitted for a CT and she was  empirically placed on IV Cipro and Flagyl for possible diverticulitis.  A  surgery consultation was obtained.  CT showed sigmoid diverticulitis with  phlegmon and ?extraluminal air.  Surgery recommended bowel rest, IV  antibiotics, and follow closely.  She did improve with above therapy and by  Nov 11, 2002 was able to take p.o.'s and go home.   Problem 2 - She developed mild hypoglycemia over hospitalization with CBGs  of over 130-156.  She was placed on sliding scale insulin but with treatment  of her underlying problems this presumed  stress hypoglycemia, resolved.   FOLLOW UP:  She is to follow up with Darius Bump, M.D. one to two  weeks following discharge.  In addition, per Gabrielle Dare. Janee Morn, M.D. she was  to follow up with him one to two months following discharge for discussion  of possible resection of this part of her colon.   LABORATORIES:  On admission her white count was 14.9, 6.6 at time of  discharge.  Hemoglobin remained stable in the  13-14 range.  She had mild  hypokalemia on admission which was normal at the time of discharge.  Transaminases remained normal throughout hospitalization.  She had blood  cultures done at time of admission which remained negative.  Darius Bump, M.D.   MJM/MEDQ  D:  12/14/2002  T:  12/14/2002  Job:  [id number]

## 2010-11-28 NOTE — Discharge Summary (Signed)
NAMEMarland Kitchen  Monica Irwin, Monica Irwin                       ACCOUNT NO.:  [id number]   MEDICAL RECORD NO.:  [id number]                   PATIENT TYPE:  INP   LOCATION:  6705                                 FACILITY:  [facility]   PHYSICIAN:  Gabrielle Dare. Janee Morn, M.D.             DATE OF BIRTH:  1945-08-21   DATE OF ADMISSION:  07/23/2003  DATE OF DISCHARGE:  07/27/2003                                 DISCHARGE SUMMARY   DISCHARGE DIAGNOSES:  1. Sigmoid colon diverticulitis.  2. Status post sigmoid colectomy.   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  She now presents for  elective sigmoid resection.   HOSPITAL COURSE:  The patient was admitted and brought to the operating room  where she underwent an uneventful sigmoid colectomy.  There was noted to be  a good amount of residual inflammation in her sigmoid colon.  Primary  anastomosis was accomplished.  Postoperatively, she remained hemodynamically  stable.  The nasogastric tube was removed on postoperative day one.  PCA was  used for pain control and her postoperative ileus gradually resolved.  Foley  catheter was removed on postoperative day two.  Postoperative day three she  was started on clear liquids.  Pathology report demonstrated diverticulitis  with a small abscess and diverticulosis.  Hypokalemia was treated with  supplementation.  On postoperative day four she was passing large amounts of  flatus and tolerating her diet.  She was discharged to home.  She had  remained afebrile and hemodynamically stable.   DISCHARGE DIET:  Low residue.   DISCHARGE ACTIVITIES:  No lifting.   DISCHARGE MEDICATIONS:  Percocet 5/325 one to two p.o. q. 6 hours p.r.n.  pain.   FOLLOW UP:  Follow-up is with myself in three weeks.                                                Gabrielle Dare Janee Morn,  M.D.    BET/MEDQ  D:  08/16/2003  T:  08/17/2003  Job:  [id number]

## 2010-11-28 NOTE — Op Note (Signed)
NAME:  Monica Irwin, Monica Irwin                       ACCOUNT NO.:  [id number]   MEDICAL RECORD NO.:  [id number]                   PATIENT TYPE:  INP   LOCATION:  NA                                   FACILITY:  [facility]   PHYSICIAN:  Gabrielle Dare. Janee Morn, M.D.             DATE OF BIRTH:  05-Apr-1946   DATE OF PROCEDURE:  07/23/2003  DATE OF DISCHARGE:                                 OPERATIVE REPORT   PREOPERATIVE DIAGNOSIS:  Sigmoid diverticulosis.   POSTOPERATIVE DIAGNOSIS:  Sigmoid diverticulosis.   OPERATION PERFORMED:  Sigmoid colon resection.   SURGEON:  Gabrielle Dare. Janee Morn, M.D.   ASSISTANT:  Rose Phi. Maple Hudson, M.D.   ANESTHESIA:  General.   INDICATIONS FOR PROCEDURE:  The patient is a 65 year old female who had an  episode of sigmoid diverticulitis the end of April of 2004.  There was a  very small localized perforation and she was treated conservatively with  intravenous antibiotics and discharged home from the hospital.  She has been  feeling fairly well and has only had occasional very mild left lower  quadrant abdominal pain.  She went to see Petra Kuba, M.D. as she wanted  to change gastroenterologists and spoke with him in regard to having  surgery.  I evaluated her twice in the hospital and she was eager to proceed  with elective sigmoid resection to prevent further complications from her  sigmoid diverticulosis.   DESCRIPTION OF PROCEDURE:  Informed consent was obtained.  The patient  received intravenous antibiotics.  She was taken to the operating room and  general anesthesia was administered.  Her abdomen was prepped and draped in  sterile fashion.  A midline incision was made from above the umbilicus down  toward the pubis.  Subcutaneous tissue was dissected down.  The fascia was  divided anteriorly and the abdominal cavity was entered under direct vision  without difficulty.  The abdomen was then locally explored.  She had a short  segment of sigmoid with numerous  diverticula in addition to a pretty  hardened thickened area which was I am certain corresponds to her area of  localized perforation.  There was not an enormous amount of diverticulosis  of the remainder of her rectosigmoid and her left colon, so we proceeded  with resection of her sigmoid colon.  First the small bowel was packed out  of the way.  The sigmoid colon was freed up from its retroperitoneal  attachments by using Bovie cautery and blunt dissection.  During this we  were sure to stay well above the ureter and other deep pelvic vascular  structures.  The colon was divided proximally at a point around the proximal  sigmoid area where there were no significant diverticula.  This was divided  with a GIA-75 stapler.  Subsequently the mesentery was taken down  sequentially with clamps.  The mesentery was divided and then securely  ligated with 2-0 silk ties  or suture ligated with 2-0 silk sutures as  needed.  This mesenteric division continued down to the area we selected  previously which was below the affected portion of the sigmoid colon.  Once  we reached that area, the distal margin of resection was again accomplished  with the GIA-75 stapler and the specimen was sent to pathology.  At this  time one small area of bleeding near the proximal stump of colon was  controlled with suture ligatures.  The colon was significantly mobilized and  laid very nicely for a side-to-side stapled anastomosis.  We secured it in  place with two 2-0 silk sutures to keep things lined up nicely and the  anastomosis was created in side-to-side fashion with a GIA-75 stapler.  The  staple line was inspected on the inside of the colon while protecting the  surrounding area from colonic contamination.  No significant hemorrhage was  noted at this time.  The resulting enterotomy was closed with a TA-60  stapler.  Hemostasis was ensured at all staple lines.  The resulting  mesenteric rents were closed with a  series of interrupted 2-0 silk sutures.  We changed our gloves and the abdomen was copiously irrigated.  The  anastomosis was palpable and felt nicely patent.  One additional supportive  crotch stitch was placed at the crotch of the anastomosis.  This was a 2-0  silk suture as well.  Once that was accomplished, the anastomosis was  rechecked and it appeared viable with no active bleeding.  The abdomen was  then irrigated, again irrigant returned clear.  The omentum and remainder of  the abdominal contents were returned back to anatomic position.  Closure was  accomplished in the following fashion. After ensuring sponge, needle and  instrument counts were correct, the abdominal fascia was closed with running  #1 PDS suture with two lengths, one starting from each of the incision and  tied in the middle.  The subcutaneous tissue were copiously irrigated and  the skin was closed with staples.  The sponge, needle and instrument counts  were again correct.  Sterile dressing was applied.  The patient tolerated  the procedure without apparent complications and was taken to recovery room  in stable condition.                                               Gabrielle Dare Janee Morn, M.D.    BET/MEDQ  D:  07/23/2003  T:  07/23/2003  Job:  [id number]

## 2010-11-28 NOTE — Discharge Summary (Signed)
. [facility]  Patient:    Monica Irwin, Monica Irwin Visit Number: [id number] MRN: [id number]          Service Type: SUR Location: 3000 3014 [id number] Attending Physician:  Colon Branch Dictated by:   Cristi Loron, M.D. Admit Date:  12/08/2001 Discharge Date: 12/11/2001                             Discharge Summary  For full details of this admission please refer to typed history and physical.  BRIEF HISTORY:  The patient is a 65 year old woman who has had a few months history of right hip pain radiating down her leg toward her ankle.  She has had no left sided symptoms and minimal back pain worsened when she is sitting or too active.  Sometimes it continued to actually worsen despite anti-infammatory and steroid Dosepaks.  The exam showed some weakness in her right extensor hallices longus at 4+/5 strength, sensation decreased to pinprick in the right L5 distribution.  The patient is now brought in now for discectomy.  For further details of this admission, please refer to the typed history and physical.  HOSPITAL COURSE: The patient was admitted on Dec 08, 2001 with a diagnosis of recurrent herniated nucleus pulposus at L4-5 on the right.  On the day of admission, Dr. Phoebe Perch performed a right L4-5 redo semi hemilaminectomy and discectomy using microdissection.  During the surgery there was a small dural rent which was repaired primarily and then coated with fibrin glue (for further details of this operation please refer to typed operative note).  POSTOPERATIVE COURSE:  The patients postoperative course was more or less unremarkable.  She remained flat until postoperative day number two and then the head of her bed was elevated.  She was allowed to get up out of bed on Dec 10, 2001 and by December 11, 2001 the patient was afebrile, vital signs were stable and she was eating well.  She was ambulating well. Her wound was healing well without signs of  infection.  There was no discharge. She had no headache and she was requesting discharge to home.  She was therefore discharge home on December 11, 2001.  DISCHARGE INSTRUCTIONS:  The patient was given written discharge instructions and instructed to follow-up with Dr. Phoebe Perch.  DISCHARGE MEDICATIONS:  Percocet 5, #60, one to two p.o. q.4h p.r.n. for pain, limit eight per day and no refills.  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  PROCEDURE PERFORMED:  Redo right L4-5 microdiskectomy using microdissection. Dictated by:   Cristi Loron, M.D. Attending Physician:  Colon Branch DD:  12/11/01 TD:  12/12/01 Job: [id number]

## 2010-11-28 NOTE — H&P (Signed)
[place]. [facility]  Patient:    Monica Irwin, Monica Irwin Visit Number: [id number] MRN: [id number]          Service Type: SUR Location: 3000 3014 01 Attending Physician:  Colon Branch Dictated by:   Clydene Fake, M.D. Admit Date:  12/08/2001                           History and Physical  CHIEF COMPLAINT: Right hip and leg pain.  HISTORY OF PRESENT ILLNESS: The patient is a 65 year old right-handed woman, who for the last few months has had pain radiating down her right hip and middle leg toward the ankle.  No bowel or bladder complaints.  Denies any left-sided symptoms.  Minimal backache.  The pain is worse when she is sitting or is too active.  She has been on steroid dospaks and nonsteroidal anti-inflammatories which have not helped and symptoms have progressed.  No other changes.  PAST MEDICAL HISTORY: Otherwise negative.  MEDICATIONS: None.  ALLERGIES: No known drug allergies.  PAST SURGICAL HISTORY: Two lumbar laminectomies in 1982 and 1987.  SOCIAL HISTORY: She is single.  Employed as a Midwife.  Does not smoke or drink alcohol.  REVIEW OF SYSTEMS: Otherwise negative.  FAMILY HISTORY: Noncontributory.  PHYSICAL EXAMINATION:  HEENT: Unremarkable.  NECK: Supple.  LUNGS: Clear.  HEART: Regular rhythm.  ABDOMEN: Soft, nontender.  EXTREMITIES: Intact.  No edema.  BACK/NEUROLOGIC: Range of motion of the back full in all directions.  Right lateral bending brings on some right hip pain, negative on the left. Straight-leg-raise is negative on the left, positive on the right.  Motor strength shows 5/5 strength in all muscle groups except for the right extensor hallucis longus which is 4+/5.  Sensation is decreased to pinprick in the right L5 distribution.  Deep tendon reflexes are 1/4 and equal in knees and ankles.  Gait is normal.  LABORATORY DATA: MRI was done showing some mild degeneration at 4-5 and 5-1 with a large  recurrent disk herniation, free fragment at 4-5 on the right side.  ASSESSMENT: The patient has a large recurrent disk herniation.  PLAN: The patient is brought in for diskectomy. Dictated by:   Clydene Fake, M.D. Attending Physician:  Colon Branch DD:  12/08/01 TD:  12/09/01 Job: [id number]

## 2010-11-28 NOTE — H&P (Signed)
NAME:  Monica Irwin, RICKLEFS                       ACCOUNT NO.:  [id number]   MEDICAL RECORD NO.:  [id number]                   PATIENT TYPE:  EMS   LOCATION:  MAJO                                 FACILITY:  [facility]   PHYSICIAN:  Darius Bump, M.D.             DATE OF BIRTH:  [birth year]   DATE OF ADMISSION:  11/07/2002  DATE OF DISCHARGE:                                HISTORY & PHYSICAL   IDENTIFICATION:  A 65 year old woman with severe left lower quadrant pain.   HISTORY OF PRESENT ILLNESS:  The patient is a very pleasant 65 year old  woman who last evening started having acute onset of abdominal pain.  She  described initially that it was diffuse across her lower abdomen but then  was localized to her left lower quadrant.  It has become progressively worse  over the last 12 hours.  She has had mild nausea but not vomited.  She had a  bowel movement this morning that was slightly loose, no diarrhea, has had  anorexia for the last 18 hours, has had some questionable urinary frequency  without burning.  She does not feel that she has had fever, may have had  chills.  Describes pain as dull and throbbing and the worse pain she has  experienced.   PAST MEDICAL HISTORY:  1. Depression, recently started on fluoxetine.  2. Symptomatic GERD with a history of GU in the past.  3. Menopausal.  4. 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   PAST SURGICAL HISTORY:  Back surgery in 1981, 1987, and 2003 (lumbar  laminectomy in 2003).   CURRENT MEDICATIONS:  1. Calcium.  2. Fluoxetine 20.  3. Vitamin E.   ALLERGIES:  She has no known drug allergies.   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  No tobacco, no  alcohol, no IV drug use.   REVIEW OF SYSTEMS:  No shortness of breath or chest pain.  No dizziness, no  ill contacts.  Otherwise per HPI.  On review of systems, she did have a  colonoscopy early in 2004.  I do not have a record but apparently it did  show diverticulosis done by  Dr. Loreta Ave.   PHYSICAL EXAMINATION:  GENERAL:  An uncomfortable-appearing woman in no  acute distress.  VITAL SIGNS:  Blood pressure 122/70, temperature 97.1, weight 159 pounds,  heart rate 68.  HEENT:  Pupils equal, round, and reactive to light.  Extraocular muscles  intact.  Oropharynx is unremarkable.  TM's are unremarkable.  NECK:  Supple without lymphadenopathy.  LUNGS:  Clear.  HEART:  Regular rhythm and rate.  ABDOMEN:  Soft in the upper quadrants and in the left lower quadrant there  is significant tenderness with minimal palpation.  There is some evidence of  guarding, not able to assess rebound.  PELVIC:  Exam was without adnexal tenderness or mass.  RECTAL:  Unremarkable.  EXTREMITIES:  No edema,  2+ pulses.   LABORATORY DATA:  White count is 9.2, hemoglobin 14.9, platelet count 210  with 89% polys.  UA shows trace leukocyte and 2+ ketones.   ASSESSMENT:  A 65 year old woman significant onset severe left lower  quadrant pain, uncertain etiology.  Differential would include  diverticulitis versus an unusual location of her appendix.   PLAN:  The plan is to admit her and to send her to the emergency room for  observation.  I am going to start her on intravenous ciprofloxacin and  Flagyl for possible diverticulitis.  We are going to get a stat CT and put  her on Dilaudid for pain.  She is agreeable to this plan.                                               Darius Bump, M.D.    MJM/MEDQ  D:  11/07/2002  T:  11/07/2002  Job:  [id number]

## 2010-11-28 NOTE — Op Note (Signed)
NAMENAYA, ILAGAN             ACCOUNT NO.:  [id number]   MEDICAL RECORD NO.:  [id number]          PATIENT TYPE:  AMB   LOCATION:  DAY                          FACILITY:  [facility]   PHYSICIAN:  Velora Heckler, MD      DATE OF BIRTH:  [birth year]   DATE OF PROCEDURE:  08/26/2006  DATE OF DISCHARGE:                               OPERATIVE REPORT   PREOPERATIVE DIAGNOSIS:  Bilateral thyroid nodules, Hurthle cell change,  thyroid goiter.   POSTOPERATIVE DIAGNOSIS:  Bilateral thyroid nodules, Hurthle cell  change, thyroid goiter.   PROCEDURE:  Total thyroidectomy.   SURGEON:  Velora Heckler, MD, FACS   ASSISTANT:  Karie Soda, MD   ANESTHESIA:  General.   ESTIMATED BLOOD LOSS:  Minimal.   PREPARATION:  Betadine.   COMPLICATIONS:  None.   INDICATIONS:  The patient is a 65 year old white female from [place],  [place].  She is referred by Dr. Madison Hickman for thyroid  goiter with bilateral thyroid nodules.  The patient has a dominant  nodule in the left thyroid lobe measuring 4.4 cm in size.  Fine-needle  aspiration biopsy demonstrated Hurthle cell change and further tissue  studies were recommended for definitive diagnosis.  The patient now  comes to surgery for thyroidectomy.   BODY OF REPORT:  Procedure was done in OR #11 at the [facility].  The patient is brought to the operating room,  placed in supine position on the operating room table.  Following  administration of general anesthesia, the patient is positioned and then  prepped and draped in the usual strict aseptic fashion.  After  ascertaining that an adequate level of anesthesia been obtained, a  Kocher incision was made with a #15 blade.  Dissection was carried  through subcutaneous tissues and platysma.  Hemostasis was obtained with  the electrocautery.  Skin flaps were elevated cephalad and caudad from  the thyroid notch to the sternal notch.  A Mahorner self-retaining  retractor  is placed for exposure.  Strap muscles are incised in the  midline.  Dissection is begun on the left side.  Strap muscles are  reflected laterally and the left thyroid lobe was exposed using a Scientist, forensic.  There is a dominant nodule occupying large portion of the  left lobe.  Lobe is gently dissected out.  Venous tributaries were  divided between medium Ligaclips with Harmonic scalpel.  Superior pole  vessels were dissected out.  Superior pole vessels are ligated in  continuity with 2-0 silk ties and divided with the Harmonic scalpel.  Gland is rolled anteriorly.  Parathyroid tissue was identified and  preserved.  Branches of the inferior thyroid artery are divided with the  Harmonic scalpel.  Inferior venous tributaries were divided with the  Harmonic scalpel.  Recurrent nerve was identified and preserved.  Ligament of Allyson Sabal is transected with electrocautery.  Small branches of  the inferior thyroid artery are divided between small Ligaclips.  Gland  is mobilized up and onto the anterior trachea.  A small pyramidal lobe  was dissected off the thyroid cartilage with  electrocautery used for  hemostasis.   Next we turned our attention to the right thyroid lobe.  Right thyroid  lobe contained multiple small nodules.  Strap muscles were reflected  laterally.  Middle vein is divided between medium Ligaclips with the  Harmonic scalpel.  Superior pole was dissected out.  Superior pole  vessels are ligated in continuity with 2-0 silk ties and divided with  the Harmonic scalpel.  Gland is rolled anteriorly.  Parathyroid tissue  was identified and preserved.  Branches of the inferior thyroid artery  are divided between small Ligaclips.  Recurrent nerve was identified and  preserved along its course.  Inferior venous tributaries were divided  with the Harmonic scalpel.  Ligament of Allyson Sabal is transected with  electrocautery.  The gland is rolled up and onto the anterior trachea  from which it  is completely excised using the Harmonic scalpel.  Sutures  used to mark the left superior pole.  Entire gland was submitted to  pathology for review.  The neck is irrigated with warm saline.  This was  evacuated.  Good hemostasis was noted bilaterally.  Surgicel was placed  over the area of the recurrent nerve and parathyroid glands bilaterally.  Strap muscles were reapproximated midline with interrupted 3-0 Vicryl  sutures.  Platysma was closed with interrupted 3-0 Vicryl sutures.  Skin  was closed with running 4-0 Monocryl subcuticular suture.  Wound is  washed and dried and Benzoin Steri-Strips were applied.  Sterile  dressings were applied.  The patient is awakened from anesthesia and  brought to the recovery room in stable condition.  The patient tolerated  the procedure well.      Velora Heckler, MD  Electronically Signed     TMG/MEDQ  D:  08/26/2006  T:  08/26/2006  Job:  [id number]   cc:   Darius Bump, M.D.  Fax: [phone]

## 2010-11-28 NOTE — Op Note (Signed)
[place]. [facility]  Patient:    Monica Irwin, Monica Irwin Visit Number: [id number] MRN: [id number]          Service Type: SUR Location: 3000 3014 01 Attending Physician:  Colon Branch Dictated by:   Clydene Fake, M.D. Proc. Date: 12/08/01 Admit Date:  12/08/2001                             Operative Report  PREOPERATIVE DIAGNOSIS:  Recurrent herniated nucleus pulposus, right L4-5.  POSTOPERATIVE DIAGNOSIS:  Recurrent herniated nucleus pulposus, right L4-5.  PROCEDURE:  Right L4-5 redo semihemilaminectomy and diskectomy, microdissection with microscope.  SURGEON:  Clydene Fake, M.D.  ASSISTANT:  Stefani Dama, M.D.  ANESTHESIA:  General endotracheal tube anesthesia.  ESTIMATED BLOOD LOSS:  Minimal.  BLOOD GIVEN:  None.  DRAINS:  None.  COMPLICATIONS:  A small dural rent occurred dissecting through scar, was repaired primarily and then coated with fibrin glue.  REASON FOR PROCEDURE:  The patient is a 65 year old woman who has had a few months history of right hip pain radiating down her leg toward her ankle.  No left-sided symptoms.  Minimal back pain, worse when she is sitting or too active.  Sometimes it continued to actually worsen despite anti-inflammatories and steroid dosepaks.  Exam showed some weakness in the right extensor hallucis longus at 4+/5 strength, sensation decreased to pinprick in the right L5 distribution.  The patient brought in now for a diskectomy.  DESCRIPTION OF PROCEDURE:  The patient was brought in the operating room, and general anesthesia was induced.  The patient was placed in the prone position on the Wilson frame with all pressure points padded.  The patient was prepped and draped in a sterile fashion and the site of incision was injected with 10 cc of 1% lidocaine with epinephrine.  Incision was then made at the superior aspect of the previous scar in the midline of the lumbar spine. Incision taken down to  the fascia and the fascia was incised on the right side and a marker was placed, and this showed the 3-4 interspace.  We moved down one interspace and did a subperiosteal dissection over the spinous processes and laminae of L4 and L5 out to the facet.  We could see a scar from the prior surgery there.  A marker was placed and another x-ray obtained confirming our positioning.  The scar peeled off the 4 lamina nicely and we were dissecting that off the dura and as we were doing this, a dural rent occurred.  We performed a laminectomy over the superior aspect of 5 and on the edges of this.  The microscope was brought in during this dissection for the microdissection for the rest of the case.  The dural tear was then sutured with 6-0 Prolene in a running suture.  Valsalva after that showed we had good closure of the dural tear, and it was covered with Gelfoam for the rest of the case.  Dissection was then taken carefully down the medial facet, which was partly removed with the drill and Kerrison punches.  We dissected the dura from the scar laterally from the pedicle and worked up to the disk space and dissected the dura out away from the disk space.  We could feel the fragment up under the root but were unable to get that out.  The disk space was incised with a 15 blade and diskectomy performed with pituitary  rongeurs.  As we made room by performing the diskectomy, we were able to get a hook around the fragment and remove this large fragment.  This greatly decompressed the thecal sac and the nerve root, and we continued the diskectomy.  When we were finished, there was good decompression of the thecal sac and the 4 and 5 nerve roots.  The wound was irrigated with antibiotic solution and hemostasis obtained with bipolar cauterization and Gelfoam and thrombon.  The Gelfoam was irrigated out.  Surgicel was placed over the dural repair, and Tisseel tissue glue was placed over the dura.  Retractors  were removed, and the fascia was closed with 0 Vicryl interrupted sutures, the subcutaneous tissue was closed with 0, 2-0, and 3-0 Vicryl interrupted sutures.  We closed the skin with benzoin and Steri-Strips.  Dressing was placed.  The patient was placed back in the supine position, awoken from anesthesia, and transferred to the recovery room in stable condition. Dictated by:   Clydene Fake, M.D. Attending Physician:  Colon Branch DD:  12/08/01 TD:  12/09/01 Job: [id number]

## 2010-11-28 NOTE — Consult Note (Signed)
NAME:  Monica Irwin, ELSAYED                       ACCOUNT NO.:  [id number]   MEDICAL RECORD NO.:  [id number]                   PATIENT TYPE:  INP   LOCATION:  5705                                 FACILITY:  [facility]   PHYSICIAN:  Gabrielle Dare. Janee Morn, M.D.             DATE OF BIRTH:  [birth year]   DATE OF CONSULTATION:  DATE OF DISCHARGE:                                   CONSULTATION   REASON FOR CONSULTATION:  Left lower quadrant abdominal pain.   HISTORY OF PRESENT ILLNESS:  I was asked to see this very pleasant patient  by Dr. Darius Bump to evaluate her left lower quadrant abdominal pain.  She is also a patient known to [employer], who performed a colonoscopy  recently.  The patient complains of increasing left lower quadrant abdominal  pain since yesterday evening.  The pain worsened and she had some mild  nausea but did not throw up and has been moving her bowels.  She had some  subjective fevers at home but otherwise had been feeling pretty well.  She  denies any past history of this but reports that Dr. Loreta Ave had diagnosed her  with diverticulosis on this recent colonoscopy.  She has no other current  complaints at this time and claims that her pain is significantly better  since admission.   PAST MEDICAL HISTORY:  This includes gastroesophageal reflux disease,  depression, and diverticulosis.   PAST SURGICAL HISTORY:  This includes three lumbar laminectomies in the  past, two in the 1980s and one in 2003.   MEDICATIONS AT HOME:  1. Paxil for her depression.  2. Calcium supplementation.  3. Fluoxetine.  4. Vitamin E.   HABITS:  She does not smoke or use alcohol.   REVIEW OF SYMPTOMS:  GENERAL:  She feels mildly weak but definitely better  since admission.  CARDIOVASCULAR:  No chest pain.  RESPIRATORY:  No  complaints.  GI:  Please see the history of present illness.  MUSCULOSKELETAL:  No current complaints.   PHYSICAL EXAMINATION:  GENERAL APPEARANCE:  She is  resting comfortably in  no acute distress.  VITAL SIGNS:  Temperature 97.4, pulse 76, respiratory rate 20, blood  pressure 106/51.  HEENT:  Pupils are equal, round and reactive to light.  NECK:  Supple with no palpable masses.  CHEST:  Clear to auscultation bilaterally.  CARDIOVASCULAR:  Regular rate and rhythm.  ABDOMEN:  Her abdomen is minimally distended with some moderate tenderness  in her left lower quadrant.  She has occasional voluntary guarding and there  is some suggestion of fullness in her left lower quadrant.  EXTREMITIES:  Warm with no edema.  Pulses are 2+ bilaterally.  RECTAL:  Examination is deferred as this was done previously by Dr. Daphine Deutscher.   LABORATORY DATA:  Laboratory values were reviewed including white blood cell  count of 9.1, hemoglobin 14.9.   CAT scan of the abdomen and  pelvis demonstrates a 5 cm liver cyst, a very  small left renal calculus and acute sigmoid diverticulitis with a phlegmon.  There is one very small bubble of extraluminal air.  There is clearly no  abscess present.  She has some minimal free fluid in this area.   ASSESSMENT:  1. Acute sigmoid diverticulitis with phlegmon.  2. No need for emergent operative intervention at this time.   RECOMMENDATIONS:  1. Continue intravenous antibiotics.  2. Bowel rest.  3. I will follow her closely with you and discuss her with Dr. Loreta Ave in the     morning.  Hopefully this can be medically treated and I would recommend     and plan elective outpatient sigmoid colectomy in three to four months     but if she worsens, of course, we will evaluate for sooner operative     intervention if needed during this hospitalization.  This plan was     discussed in detail with the patient.  Questions were answered.   Thank you very much for this consultation.                                               Gabrielle Dare Janee Morn, M.D.    BET/MEDQ  D:  11/07/2002  T:  11/08/2002  Job:  [id number]

## 2010-11-28 NOTE — Op Note (Signed)
NAME:  Monica Irwin, MCDOUGALL                       ACCOUNT NO.:  [id number]   MEDICAL RECORD NO.:  [id number]                   PATIENT TYPE:  AMB   LOCATION:  ENDO                                 FACILITY:  [facility]   PHYSICIAN:  Anselmo Rod, M.D.               DATE OF BIRTH:  [birth year]   DATE OF PROCEDURE:  10/06/2002  DATE OF DISCHARGE:  10/06/2002                                 OPERATIVE REPORT   PROCEDURE:  Colonoscopy with snare polypectomy x1 and cold biopsies x2.   ENDOSCOPIST:  Charna Elizabeth, M.D.   INSTRUMENT USED:  Olympus video colonoscope.   INDICATIONS FOR PROCEDURE:  [age]-year-old white female with a family  history of colon cancer in her sister who has had a partial colectomy under  screening colonoscopy to rule out colonic polyps, masses, etc.   PROCEDURE PERFORMED:  Informed consent was procured from the patient.  The  patient was fasted for eight hours prior to the procedure and prepped with a  bottle of magnesium citrate and a gallon of GoLYTELY the night prior to the  procedure.   PREPROCEDURE PHYSICAL EXAMINATION:  VITAL SIGNS:  The patient had stable  vital signs.  NECK:  Supple.  CHEST:  Clear to auscultation.  HEART:  S1 and S2 regular.  ABDOMEN:  Soft with normal bowel sounds.   DESCRIPTION OF PROCEDURE:  The patient was placed in the left lateral  decubitus position, sedated with 70 mg of Demerol and 7.5 mg of Versed  intravenously.  Once the patient was adequately sedated and maintained on  low flow oxygen, continuous cardiac monitoring, the Olympus video  colonoscope was advanced from the rectum to the cecum with difficulty.  There was some residual stool in the colon.  Multiple washings were done.  There was evidence of pan diverticular disease with no prominent changes in  the left colon.  Three small sessile polyps were biopsied from the proximal  right colon.  One of these was in the cecum.  A 4-5 cm sessile polyp was  snared from the  proximal right colon close to the IC valve.  There were  small internal hemorrhoids seen on retroflexion and small external  hemorrhoids seen on withdrawal of the scope. No large masses or polyps were  seen.  The patient tolerated the procedure well without complications.   IMPRESSION:  1. Small external hemorrhoid.  2. Small nonbleeding internal hemorrhoid.  3. Severe pandiverticular disease with more prominent changes in the left     colon.  4. Three small sessile polyps removed by cold biopsy, two from the proximal     right colon and one from the cecum.  5. A 4-5 mm polyp was snared from the proximal right colon close to the     ileocecal valve.   RECOMMENDATIONS:  1. Await pathology results.  2.     __________ for now.  3. High fiber diet with  liberal fluid intake.  4. Outpatient follow up in the next two weeks for further recommendations.                                               Anselmo Rod, M.D.    JNM/MEDQ  D:  10/06/2002  T:  10/08/2002  Job:  [id number]   cc:   Pershing Cox, M.D.  [address]  [address]  [place]  [place] [address]  Fax: [phone]

## 2010-12-30 ENCOUNTER — Encounter: Payer: Self-pay | Admitting: Family Medicine

## 2011-01-05 ENCOUNTER — Encounter: Payer: Self-pay | Admitting: Family Medicine

## 2011-03-03 ENCOUNTER — Encounter: Payer: Self-pay | Admitting: Family Medicine

## 2011-03-04 ENCOUNTER — Ambulatory Visit (INDEPENDENT_AMBULATORY_CARE_PROVIDER_SITE_OTHER): Payer: Medicare Other | Admitting: Family Medicine

## 2011-03-04 ENCOUNTER — Encounter: Payer: Self-pay | Admitting: Family Medicine

## 2011-03-04 VITALS — BP 100/64 | HR 67 | Temp 98.7°F | Ht 64.0 in | Wt 158.4 lb

## 2011-03-04 DIAGNOSIS — M542 Cervicalgia: Secondary | ICD-10-CM

## 2011-03-04 DIAGNOSIS — M25579 Pain in unspecified ankle and joints of unspecified foot: Secondary | ICD-10-CM

## 2011-03-04 MED ORDER — CYCLOBENZAPRINE HCL 10 MG PO TABS: 10.0000 mg | ORAL_TABLET | Freq: Three times a day (TID) | ORAL | Status: AC | PRN

## 2011-03-04 MED ORDER — DICLOFENAC SODIUM 75 MG PO TBEC: 75.0000 mg | DELAYED_RELEASE_TABLET | Freq: Two times a day (BID) | ORAL | Status: DC

## 2011-03-04 NOTE — Progress Notes (Signed)
  Subjective:    Patient ID: Monica Irwin, female    DOB: 01/17/1946, 65 y.o.   MRN: [id number]  HPI  Monica Irwin, a 65 y.o. female presents today in the office for the following:    Larey Seat and foot slipped on some wet steps one morning, has been hurting her for at least a month and a half. Sometime will hurt a fair amount. Sometimes it will not. Top of ankle. Point of maximal tenderness is not in the bone, but in the anterior musculature adjacent skin between the tibia and fibula. She also has some pain just distal to this. She is able to walk without any difficulty and with no limp. There is no bruising or swelling. This is the 1st medical evaluation.  Left neck, worke up with a crick on her neck. Also a spot tat is tender to the touch anteriorly. [place] in the paracervical musculature, and  Some slight restriction of motion. No trauma or injury inciting this. Diffuse osteoarthritic changes are noted in the past and from past encounters with the patient. Some mild shoulder blade pain. No other radicular symptoms. No weakness or numbness.    The PMH, PSH, Social History, Family History, Medications, and allergies have been reviewed in [facility], and have been updated if relevant.  Review of Systems REVIEW OF SYSTEMS  GEN: No fevers, chills. Nontoxic. Primarily MSK c/o today. MSK: Detailed in the HPI GI: tolerating PO intake without difficulty Neuro: No numbness, parasthesias, or tingling associated. Otherwise the pertinent positives of the ROS are noted above.      Objective:   Physical Exam   Physical Exam  Blood pressure 100/64, pulse 67, temperature 98.7 F (37.1 C), temperature source Oral, height 5\' 4"  (1.626 m), weight 158 lb 6.4 oz (71.85 kg), SpO2 99.00%.  GEN: Well-developed,well-nourished,in no acute distress; alert,appropriate and cooperative throughout examination HEENT: Normocephalic and atraumatic without obvious abnormalities. Ears, externally no  deformities PULM: Breathing comfortably in no respiratory distress EXT: No clubbing, cyanosis, or edema PSYCH: Normally interactive. Cooperative during the interview. Pleasant. Friendly and conversant. Not anxious or depressed appearing. Normal, full affect.  CERVICAL SPINE EXAM Range of motion: Flexion, extension, lateral bending, and rotation: loss of about 15% of motion compared to normal global weight Pain with terminal motion: mostly with rotation on the stress of the LEFT side of her neck Spinous Processes: NT SCM: NT Upper paracervical muscles: tender to palpation LEFT Upper traps: mildly tender to palpation on the LEFT C5-T1 intact, sensation and motor  nontender throughout bilateral ankles throughout the malleoli, navicular, cuboid, metatarsals, Tillis. There is diminished proprioceptive function and ability to stand on 1 foot with eyes open is markedly decreased on injured foot compared to contralateral side. An immediate step down.       Assessment & Plan:   1. Neck pain  diclofenac (VOLTAREN) 75 MG EC tablet, cyclobenzaprine (FLEXERIL) 10 MG tablet  2. Ankle pain     Suspect multifactorial neck, degenerative disc disease, osteoarthritic changes. Mechanical neck pain. Reviewed McKenzie rehabilitation chin tucks and reviewed other neck rehabilitation.  Ankle, suspect proprioceptive deficit status post injury with repetitive irritation of tibialis anterior  Refer to the patient instructions sections for details of plan shared with patient.

## 2011-03-04 NOTE — Patient Instructions (Signed)
Read neck handouts and do every day  Balance  MOST IMPORTANT: WHILE BRUSHING TEETH, STAND ON YOUR FEET AS LONG AS POSSIBLE ON 1 LEG WHEN YOU CAN DO FOR MORE THAN 30 SECONDS STAND WITH EYES CLOSED RIGHT FOOT  LEFT FOOT AT LEAST A MINUTE IN TOTAL EACH

## 2011-05-13 ENCOUNTER — Other Ambulatory Visit: Payer: Self-pay | Admitting: *Deleted

## 2011-05-13 MED ORDER — LEVOTHYROXINE SODIUM 88 MCG PO TABS: 88.0000 ug | ORAL_TABLET | Freq: Every day | ORAL | Status: DC

## 2011-08-10 ENCOUNTER — Other Ambulatory Visit: Payer: Self-pay | Admitting: *Deleted

## 2011-08-10 MED ORDER — LEVOTHYROXINE SODIUM 88 MCG PO TABS: 88.0000 ug | ORAL_TABLET | Freq: Every day | ORAL | Status: DC

## 2011-09-03 ENCOUNTER — Encounter: Payer: Self-pay | Admitting: Family Medicine

## 2011-09-03 ENCOUNTER — Ambulatory Visit (INDEPENDENT_AMBULATORY_CARE_PROVIDER_SITE_OTHER): Payer: Medicare Other | Admitting: Family Medicine

## 2011-09-03 DIAGNOSIS — E785 Hyperlipidemia, unspecified: Secondary | ICD-10-CM

## 2011-09-03 DIAGNOSIS — E039 Hypothyroidism, unspecified: Secondary | ICD-10-CM

## 2011-09-03 DIAGNOSIS — Z79899 Other long term (current) drug therapy: Secondary | ICD-10-CM

## 2011-09-03 LAB — BASIC METABOLIC PANEL
CO2: 27 mEq/L (ref 19–32)
Chloride: 107 mEq/L (ref 96–112)
Glucose, Bld: 92 mg/dL (ref 70–99)
Sodium: 140 mEq/L (ref 135–145)

## 2011-09-03 LAB — LIPID PANEL
HDL: 58 mg/dL (ref >39.00)
Total CHOL/HDL Ratio: 4
Triglycerides: 88 mg/dL (ref 0.0–149.0)

## 2011-09-03 NOTE — Progress Notes (Signed)
  Patient Name: Monica Irwin Date of Birth: 11-Sep-1945 Age: 66 y.o. Medical Record Number: [id number] Gender: female Date of Encounter: 09/03/2011  History of Present Illness:  Monica Irwin is a 66 y.o. very pleasant female patient who presents with the following:  Thyroid: No symptoms. Labs reviewed. Denies cold / heat intolerance, dry skin, hair loss. No goiter.  Lab Results  Component Value Date   TSH 0.95 05/19/2010   Lipids: Doing well, stable. No meds right now Lipids:    Component Value Date/Time   CHOL 224* 05/19/2010 1047   TRIG 75.0 05/19/2010 1047   HDL 54.70 05/19/2010 1047   LDLDIRECT 161.5 05/19/2010 1047   VLDL 15.0 05/19/2010 1047   CHOLHDL 4 05/19/2010 1047    Lab Results  Component Value Date   ALT 26 05/19/2010   AST 21 05/19/2010   ALKPHOS 59 05/19/2010   BILITOT 0.6 05/19/2010     Mammogram - 2012, November  Past Medical History, Surgical History, Social History, Family History, Problem List, Medications, and Allergies have been reviewed and updated if relevant.  Review of Systems:  GEN: No acute illnesses, no fevers, chills. GI: No n/v/d, eating normally Pulm: No SOB Interactive and getting along well at home.  Otherwise, ROS is as per the HPI.   Physical Examination: Filed Vitals:   09/03/11 0756  BP: 110/60  Pulse: 65  Temp: 97.7 F (36.5 C)  TempSrc: Oral  Height: 5' 4.5" (1.638 m)  Weight: 161 lb 6.4 oz (73.211 kg)  SpO2: 100%    Body mass index is 27.28 kg/(m^2).   GEN: WDWN, NAD, Non-toxic, A & O x 3 HEENT: Atraumatic, Normocephalic. Neck supple. No masses, No LAD. Ears and Nose: No external deformity. CV: RRR, No M/G/R. No JVD. No thrill. No extra heart sounds. PULM: CTA B, no wheezes, crackles, rhonchi. No retractions. No resp. distress. No accessory muscle use. EXTR: No c/c/e NEURO Normal gait.  PSYCH: Normally interactive. Conversant. Not depressed or anxious appearing.  Calm demeanor.    Assessment and Plan: 1.  HYPOTHYROIDISM  TSH  2. HYPERLIPIDEMIA  Lipid panel  3. Encounter for long-term (current) use of other medications  Basic metabolic panel

## 2011-09-04 ENCOUNTER — Encounter: Payer: Self-pay | Admitting: *Deleted

## 2011-09-08 ENCOUNTER — Other Ambulatory Visit: Payer: Self-pay | Admitting: *Deleted

## 2011-09-08 MED ORDER — LEVOTHYROXINE SODIUM 88 MCG PO TABS: 88.0000 ug | ORAL_TABLET | Freq: Every day | ORAL | Status: DC

## 2011-09-14 ENCOUNTER — Telehealth: Payer: Self-pay | Admitting: Family Medicine

## 2011-09-14 MED ORDER — FLUOXETINE HCL 20 MG PO TABS: 20.0000 mg | ORAL_TABLET | Freq: Every day | ORAL | Status: DC

## 2011-09-14 NOTE — Telephone Encounter (Signed)
Monica Irwin wants Dr. Patsy Lager to put her back on the medicine for depression  that he had her on last year. She does not remember the name of it. She get her meds at [facility].

## 2011-09-14 NOTE — Telephone Encounter (Signed)
Patient advised and medication sent to pharmacy. Patient will call back to schedule appt

## 2011-09-14 NOTE — Telephone Encounter (Signed)
Send in prozac 20 mg, 1 po daily, #30,1 refill  Have her f/u in the next 2-3 weeks

## 2011-09-28 ENCOUNTER — Ambulatory Visit: Payer: Medicare Other | Admitting: Family Medicine

## 2011-10-05 ENCOUNTER — Encounter: Payer: Self-pay | Admitting: Family Medicine

## 2011-10-05 ENCOUNTER — Ambulatory Visit (INDEPENDENT_AMBULATORY_CARE_PROVIDER_SITE_OTHER): Payer: Medicare Other | Admitting: Family Medicine

## 2011-10-05 VITALS — BP 120/72 | HR 67 | Temp 97.8°F | Ht 64.0 in | Wt 160.8 lb

## 2011-10-05 DIAGNOSIS — F329 Major depressive disorder, single episode, unspecified: Secondary | ICD-10-CM

## 2011-10-05 DIAGNOSIS — F3289 Other specified depressive episodes: Secondary | ICD-10-CM

## 2011-10-05 MED ORDER — FLUOXETINE HCL 40 MG PO CAPS: 40.0000 mg | ORAL_CAPSULE | Freq: Every day | ORAL | Status: DC

## 2011-10-05 NOTE — Progress Notes (Signed)
  Patient Name: Monica Irwin Date of Birth: [birth year] Age: 66 y.o. Medical Record Number: [id number] Gender: female Date of Encounter: 10/05/2011  History of Present Illness:  Monica Irwin is a 66 y.o. very pleasant female patient who presents with the following:  Not getting out to much. Edgy and has not been on her medication Having a lot of acid reflux  Tat is also bothering.   Sleeping not that great. Toss and turn some. Not Doing some things and will go out a little.   Past Medical History, Surgical History, Social History, Family History, Problem List, Medications, and Allergies have been reviewed and updated if relevant.  Review of Systems: above  Physical Examination: Filed Vitals:   10/05/11 0803  BP: 120/72  Pulse: 67  Temp: 97.8 F (36.6 C)  TempSrc: Oral  Height: 5\' 4"  (1.626 m)  Weight: 160 lb 12.8 oz (72.938 kg)  SpO2: 100%    Body mass index is 27.60 kg/(m^2).   GEN: WDWN, NAD, Non-toxic, Alert & Oriented x 3 HEENT: Atraumatic, Normocephalic.  Ears and Nose: No external deformity. EXTR: No clubbing/cyanosis/edema NEURO: Normal gait.  PSYCH: Normally interactive. Conversant. Not depressed or anxious appearing.  Calm demeanor.    Assessment and Plan: 1. DEPRESSIVE DISORDER  FLUoxetine (PROZAC) 40 MG capsule    >15 minutes spent in face to face time with patient, >50% spent in counselling or coordination of care: not improved with dep with history of 2 prior occurrences. No si or hi. No guilt. No anxiety. Titrate up prozac.

## 2011-10-05 NOTE — Patient Instructions (Signed)
F/u 4-6 weeks for CPX (30 min)

## 2012-01-19 ENCOUNTER — Ambulatory Visit (INDEPENDENT_AMBULATORY_CARE_PROVIDER_SITE_OTHER): Payer: Medicare Other | Admitting: Family Medicine

## 2012-01-19 ENCOUNTER — Encounter: Payer: Self-pay | Admitting: Family Medicine

## 2012-01-19 VITALS — BP 130/68 | HR 68 | Temp 98.2°F | Wt 161.0 lb

## 2012-01-19 DIAGNOSIS — H6980 Other specified disorders of Eustachian tube, unspecified ear: Secondary | ICD-10-CM

## 2012-01-19 MED ORDER — FLUTICASONE PROPIONATE 50 MCG/ACT NA SUSP: 2.0000 | Freq: Every day | NASAL | Status: DC

## 2012-01-19 NOTE — Progress Notes (Signed)
L ear pain.  Started last week, felt full/pressure.  Now throbbing.  No R sided sx.  No FCNAVD.  No hearing change noted by patient.  Moderate, not severe, L sided ST, not getting better.  No cough.  Doesn't wear a hearing aid.    Meds, vitals, and allergies reviewed.   ROS: See HPI.  Otherwise, noncontributory.  GEN: nad, alert and oriented HEENT: mucous membranes moist, tm w/o erythema, nasal exam w/o erythema, clear discharge noted,  OP with cobblestoning and typical viral appearing small ulcer near the soft/hard palate junction NECK: supple w/o LA CV: rrr.   PULM: ctab, no inc wob EXT: no edema SKIN: no acute rash Poor TM movement on valsalva

## 2012-01-19 NOTE — Patient Instructions (Addendum)
Use the flonase once a day and gargle with salt water.

## 2012-01-20 DIAGNOSIS — H6980 Other specified disorders of Eustachian tube, unspecified ear: Secondary | ICD-10-CM | POA: Insufficient documentation

## 2012-01-20 NOTE — Assessment & Plan Note (Addendum)
Likely viral, should resolve, f/u prn.  Anatomy and path/phys d/w pt.  Use flonase in meantime- she had good effect with nasal steroids prev.

## 2012-03-02 ENCOUNTER — Encounter: Payer: Self-pay | Admitting: Family Medicine

## 2012-03-02 ENCOUNTER — Ambulatory Visit (INDEPENDENT_AMBULATORY_CARE_PROVIDER_SITE_OTHER): Payer: Medicare Other | Admitting: Family Medicine

## 2012-03-02 ENCOUNTER — Ambulatory Visit (INDEPENDENT_AMBULATORY_CARE_PROVIDER_SITE_OTHER)
Admission: RE | Admit: 2012-03-02 | Discharge: 2012-03-02 | Disposition: A | Discharge status: Home / Self Care | Payer: Medicare Other | Source: Ambulatory Visit | Attending: Family Medicine | Admitting: Family Medicine

## 2012-03-02 VITALS — BP 120/78 | HR 59 | Temp 97.9°F | Ht 64.0 in | Wt 161.2 lb

## 2012-03-02 DIAGNOSIS — Z0000 Encounter for general adult medical examination without abnormal findings: Secondary | ICD-10-CM

## 2012-03-02 DIAGNOSIS — E039 Hypothyroidism, unspecified: Secondary | ICD-10-CM

## 2012-03-02 DIAGNOSIS — E785 Hyperlipidemia, unspecified: Secondary | ICD-10-CM

## 2012-03-02 DIAGNOSIS — M899 Disorder of bone, unspecified: Secondary | ICD-10-CM

## 2012-03-02 DIAGNOSIS — M898X1 Other specified disorders of bone, shoulder: Secondary | ICD-10-CM

## 2012-03-02 DIAGNOSIS — Z79899 Other long term (current) drug therapy: Secondary | ICD-10-CM

## 2012-03-02 LAB — CBC WITH DIFFERENTIAL/PLATELET
Eosinophils Relative: 2.1 % (ref 0.0–5.0)
HCT: 44.4 % (ref 36.0–46.0)
Hemoglobin: 14.6 g/dL (ref 12.0–15.0)
Lymphs Abs: 1.2 10*3/uL (ref 0.7–4.0)
MCV: 95.6 fl (ref 78.0–100.0)
Monocytes Absolute: 0.5 10*3/uL (ref 0.1–1.0)
Neutro Abs: 2.8 10*3/uL (ref 1.4–7.7)
Platelets: 192 10*3/uL (ref 150.0–400.0)
RDW: 12.8 % (ref 11.5–14.6)
WBC: 4.6 10*3/uL (ref 4.5–10.5)

## 2012-03-02 LAB — HEPATIC FUNCTION PANEL
AST: 22 U/L (ref 0–37)
Albumin: 4 g/dL (ref 3.5–5.2)
Alkaline Phosphatase: 55 U/L (ref 39–117)
Total Protein: 6.6 g/dL (ref 6.0–8.3)

## 2012-03-02 LAB — LIPID PANEL
Cholesterol: 217 mg/dL — ABNORMAL HIGH (ref 0–200)
Total CHOL/HDL Ratio: 4

## 2012-03-02 LAB — BASIC METABOLIC PANEL
BUN: 29 mg/dL — ABNORMAL HIGH (ref 6–23)
Chloride: 104 mEq/L (ref 96–112)
Glucose, Bld: 97 mg/dL (ref 70–99)
Potassium: 4.9 mEq/L (ref 3.5–5.1)
Sodium: 139 mEq/L (ref 135–145)

## 2012-03-02 MED ORDER — DICLOFENAC SODIUM 75 MG PO TBEC: 75.0000 mg | DELAYED_RELEASE_TABLET | Freq: Two times a day (BID) | ORAL | Status: DC

## 2012-03-02 NOTE — Progress Notes (Signed)
Nature conservation officer at [facility] [address] [place] [address] Phone: [phone] Fax: [phone]  Date:  03/02/2012   Name:  Monica Irwin   DOB:  [birth year]   MRN:  [id number] Gender: female  Age: 66 y.o.  PCP:  Monica Beat, MD    Chief Complaint: Annual Exam   History of Present Illness:  Monica Irwin is a 66 y.o. pleasant patient who presents with the following:  Check thyroid Paps are done by Dr. Noland Irwin 07/2010 DEXA scahn  Left lateral posterior shoulder, like a sore spot. Will seem to run into her neck. Mostly in the paracervical musculature, but she also has some pain at her sternoclavicular joint and in the medial clavicle.  Otherwise she is doing pretty well. She is tolerating her thyroid medication fine without any difficulties and does nothing that she has any symptoms with this.  She does have some continued arthritic discomfort and does take her diclofenac on an as-needed basis but otherwise she is doing quite well.  Health Maintenance Summary Reviewed and updated, unless pt declines services.  Tobacco History Reviewed. Non-smoker Alcohol: No concerns, no excessive use Exercise Habits: Some activity, rec at least 30 mins 5 times a week Paps, Breast exams are done by Dr. Ernestina Irwin  Health Maintenance  Topic Date Due  . Zostavax  12/02/2003  . Tetanus/tdap  07/14/2011  . Influenza Vaccine  04/12/2012  . Mammogram  05/13/2012  . Colonoscopy  11/24/2015  . Pneumococcal Polysaccharide Vaccine Age 25 And Over  Completed    Labs reviewed with the patient.  Results for orders placed in visit on 03/02/12  BASIC METABOLIC PANEL      Component Value Range   Sodium 139  135 - 145 mEq/L   Potassium 4.9  3.5 - 5.1 mEq/L   Chloride 104  96 - 112 mEq/L   CO2 28  19 - 32 mEq/L   Glucose, Bld 97  70 - 99 mg/dL   BUN 29 (*) 6 - 23 mg/dL   Creatinine, Ser 0.9  0.4 - 1.2 mg/dL   Calcium 9.4  8.4 - 86.5 mg/dL   GFR 78.46  >96.29 mL/min    CBC WITH DIFFERENTIAL      Component Value Range   WBC 4.6  4.5 - 10.5 K/uL   RBC 4.65  3.87 - 5.11 Mil/uL   Hemoglobin 14.6  12.0 - 15.0 g/dL   HCT 52.8  41.3 - 24.4 %   MCV 95.6  78.0 - 100.0 fl   MCHC 33.0  30.0 - 36.0 g/dL   RDW 01.0  27.2 - 53.6 %   Platelets 192.0  150.0 - 400.0 K/uL   Neutrophils Relative 61.2  43.0 - 77.0 %   Lymphocytes Relative 25.0  12.0 - 46.0 %   Monocytes Relative 11.0  3.0 - 12.0 %   Eosinophils Relative 2.1  0.0 - 5.0 %   Basophils Relative 0.7  0.0 - 3.0 %   Neutro Abs 2.8  1.4 - 7.7 K/uL   Lymphs Abs 1.2  0.7 - 4.0 K/uL   Monocytes Absolute 0.5  0.1 - 1.0 K/uL   Eosinophils Absolute 0.1  0.0 - 0.7 K/uL   Basophils Absolute 0.0  0.0 - 0.1 K/uL  LIPID PANEL      Component Value Range   Cholesterol 217 (*) 0 - 200 mg/dL   Triglycerides 644.0  0.0 - 149.0 mg/dL   HDL 34.74  >25.95 mg/dL  VLDL 21.6  0.0 - 40.0 mg/dL   Total CHOL/HDL Ratio 4    TSH      Component Value Range   TSH 0.54  0.35 - 5.50 uIU/mL  HEPATIC FUNCTION PANEL      Component Value Range   Total Bilirubin 0.6  0.3 - 1.2 mg/dL   Bilirubin, Direct 0.0  0.0 - 0.3 mg/dL   Alkaline Phosphatase 55  39 - 117 U/L   AST 22  0 - 37 U/L   ALT 25  0 - 35 U/L   Total Protein 6.6  6.0 - 8.3 g/dL   Albumin 4.0  3.5 - 5.2 g/dL  LDL CHOLESTEROL, DIRECT      Component Value Range   Direct LDL 147.5       Patient Active Problem List  Diagnosis  . HYPOTHYROIDISM  . HYPERLIPIDEMIA  . OSTEOARTHRITIS  . INSOMNIA  . NEPHROLITHIASIS, HX OF  . DEPRESSIVE DISORDER  . OSTEOPOROSIS    Past Medical History  Diagnosis Date  . Hypothyroidism   . Nephrolithiasis   . Osteoarthritis   . History of colonic polyps   . Osteoporosis     Past Surgical History  Procedure Date  . Back surgery     [phone]  . Partial colectomy 2005  . Thyroidectomy 2008    History  Substance Use Topics  . Smoking status: Never Smoker   . Smokeless tobacco: Not on file  . Alcohol Use: No     Family History  Problem Relation Age of Onset  . Stroke Mother   . Alzheimer's disease Mother     Allergies  Allergen Reactions  . Aspirin     REACTION: nausea and acid reflux    Medication list has been reviewed and updated.  Current Outpatient Prescriptions on File Prior to Visit  Medication Sig Dispense Refill  . Calcium Carbonate-Vitamin D (CALCIUM + D) 600-200 MG-UNIT TABS Take 2 tablets by mouth daily.        . fluticasone (FLONASE) 50 MCG/ACT nasal spray Place 2 sprays into the nose daily.  16 g  1  . glucosamine-chondroitin 500-400 MG tablet Take 1 tablet by mouth daily.        [name] levothyroxine (SYNTHROID, LEVOTHROID) 88 MCG tablet Take 1 tablet (88 mcg total) by mouth daily.  30 tablet  12    Review of Systems:   General: Denies fever, chills, sweats. No significant weight loss. Eyes: Denies blurring,significant itching ENT: Denies earache, sore throat, and hoarseness.  Cardiovascular: Denies chest pains, palpitations, dyspnea on exertion,  Respiratory: Denies cough, dyspnea at rest,wheeezing Breast: no concerns about lumps GI: Denies nausea, vomiting, diarrhea, constipation, change in bowel habits, abdominal pain, melena, hematochezia GU: Denies dysuria, hematuria, urinary hesitancy, nocturia, denies STD risk, no concerns about discharge Musculoskeletal: as above Derm: Denies rash, itching Neuro: Denies  paresthesias, frequent falls, frequent headaches Psych: Denies depression, anxiety Endocrine: Denies cold intolerance, heat intolerance, polydipsia Heme: Denies enlarged lymph nodes Allergy: No hayfever   Physical Examination: Filed Vitals:   03/02/12 0825  BP: 120/78  Pulse: 59  Temp: 97.9 F (36.6 C)   Filed Vitals:   03/02/12 0825  Height: 5\' 4"  (1.626 m)  Weight: 161 lb 4 oz (73.143 kg)   Body mass index is 27.68 kg/(m^2). Ideal Body Weight: Weight in (lb) to have BMI = 25: 145.3    GEN: well developed, well nourished, no acute  distress Eyes: conjunctiva and lids normal, PERRLA, EOMI ENT: TM clear, nares clear,  oral exam WNL Neck: supple, no lymphadenopathy, no thyromegaly, no JVD Pulm: clear to auscultation and percussion, respiratory effort normal CV: regular rate and rhythm, S1-S2, no murmur, rub or gallop, no bruits Chest: no scars, masses, no lumps BREAST: breast exam declined GI: soft, non-tender; no hepatosplenomegaly, masses; active bowel sounds all quadrants GU: GU exam declined Lymph: no cervical, axillary or inguinal adenopathy MSK: gait normal, muscle tone and strength WNL. The pain in the posterior neck and pain with lateral motions. She also has some pain along the medial aspect of the clavicle and in the sternoclavicular joint. SKIN: clear, good turgor, color WNL, no rashes, lesions, or ulcerations Neuro: normal mental status, normal strength, sensation, and motion Psych: alert; oriented to person, place and time, normally interactive and not anxious or depressed in appearance.   Assessment and Plan:  1. Routine general medical examination at a health care facility    2. Clavicle pain : Reassured regarding clavicular and neck pain. Continue with diclofenac as needed, heat, and range of motion activities.  DG Clavicle Left  3. HYPOTHYROIDISM  Lab Results  Component Value Date   TSH 0.54 03/02/2012   stable TSH  4. HYPERLIPIDEMIA  Lipids:    Component Value Date/Time   CHOL 217* 03/02/2012 0912   TRIG 108.0 03/02/2012 0912   HDL 54.30 03/02/2012 0912   LDLDIRECT 147.5 03/02/2012 0912   VLDL 21.6 03/02/2012 0912   CHOLHDL 4 03/02/2012 0912   Stable, cont non pharm treatment Lipid panel  5. Encounter for long-term (current) use of other medications  Basic metabolic panel, CBC with Differential, Hepatic function panel   I have personally reviewed the Medicare Annual Wellness questionnaire and have noted 1. The patient's medical and social history 2. Their use of alcohol, tobacco or illicit  drugs 3. Their current medications and supplements 4. The patient's functional ability including ADL's, fall risks, home safety risks and hearing or visual             impairment. 5. Diet and physical activities 6. Evidence for depression or mood disorders  The patients weight, height, BMI and visual acuity have been recorded in the chart I have made referrals, counseling and provided education to the patient based review of the above and I have provided the pt with a written personalized care plan for preventive services.  I have provided the patient with a copy of your personalized plan for preventive services. Instructed to take the time to review along with their updated medication list.   Dg Clavicle Left  03/02/2012  *RADIOLOGY REPORT*  Clinical Data: Clavicle pain.  LEFT CLAVICLE - 2+ VIEWS  Comparison: None.  Findings: No acute bony abnormality.  Specifically, no fracture, subluxation, or dislocation.  Soft tissues are intact.  Joint spaces are maintained.  IMPRESSION: No acute bony abnormality.   Original Report Authenticated By: Cyndie Chime, M.D.     Orders Today:  Orders Placed This Encounter  Procedures  . DG Clavicle Left    Standing Status: Future     Number of Occurrences: 1     Standing Expiration Date: 05/02/2013    Order Specific Question:  Reason for exam:    Answer:  clavicle pain, no trauma    Order Specific Question:  Preferred imaging location?    Answer:  [facility]  . Basic metabolic panel  . CBC with Differential  . Lipid panel  . TSH  . Hepatic function panel  . LDL cholesterol, direct    Medications  Today: (Includes new updates added during medication reconciliation) Meds ordered this encounter  Medications  . diclofenac (VOLTAREN) 75 MG EC tablet    Sig: Take 1 tablet (75 mg total) by mouth 2 (two) times daily.    Dispense:  60 tablet    Refill:  3    Medications Discontinued: Medications Discontinued During This Encounter   Medication Reason  . FLUoxetine (PROZAC) 40 MG capsule Error     Monica Beat, MD,

## 2012-05-10 ENCOUNTER — Telehealth: Payer: Self-pay

## 2012-05-10 ENCOUNTER — Ambulatory Visit (INDEPENDENT_AMBULATORY_CARE_PROVIDER_SITE_OTHER): Payer: Medicare Other | Admitting: Family Medicine

## 2012-05-10 ENCOUNTER — Encounter: Payer: Self-pay | Admitting: Family Medicine

## 2012-05-10 VITALS — BP 110/70 | HR 64 | Temp 98.1°F | Wt 167.0 lb

## 2012-05-10 DIAGNOSIS — H938X9 Other specified disorders of ear, unspecified ear: Secondary | ICD-10-CM

## 2012-05-10 MED ORDER — PREDNISONE 10 MG PO TABS: ORAL_TABLET | ORAL | Status: DC

## 2012-05-10 NOTE — Progress Notes (Signed)
  Subjective:    Patient ID: Monica Irwin, female    DOB: Nov 26, 1945, 66 y.o.   MRN: [id number]  HPI 66 year old female presents with symptoms of fluttering/popping in her left ear, ongoing x 2 weeks, constant and louder. She is not aware of any triggers.  She reports similar issues 2 years ago.. Occurred after high sound exposure. Saw ENT ( Dr. Jana Hakim on [facility] in [place])  then.. Told not sure what the issue was. Given prednisone 10 mg for  No hearing loss. She hears issue only when there is ambient sound. She wears ear plug and this helps some. No heartbeat in ears. No  ear pain. Slight sinus drainage but none until last few days. Some ear fullness. No fever.      Review of Systems     Objective:   Physical Exam        Assessment & Plan:

## 2012-05-10 NOTE — Assessment & Plan Note (Signed)
Slight evidence of eustacian tube dysfunction, not improved with nasal steroids.  This is likley want ENT 2 years ago was treating with prednisone x 12 day taper.  We will try to repeat this, but if not improving.. Call for referral back to ENT.

## 2012-05-10 NOTE — Telephone Encounter (Signed)
2 years ago pt saw ear specialist in [facility] for lt ear fluttering; was given prednisone resolved problem. Lt ear starting to flutter again; pt walked in requested rx for Prednisone 10 mg. No h/a or dizziness. Pt to see Dr Ermalene Searing this AM at 11:00.

## 2012-05-10 NOTE — Telephone Encounter (Signed)
Please locate records if we have them.  Will see the pt today as scheduled.

## 2012-05-10 NOTE — Patient Instructions (Addendum)
Prednisone taper. If symptoms not improving call for referral back to ENT.

## 2012-05-10 NOTE — Telephone Encounter (Signed)
Checked for records and unable to locate any, Dr. Ermalene Searing aware. Patient seen by Dr. Ermalene Searing.

## 2012-06-08 ENCOUNTER — Other Ambulatory Visit: Payer: Self-pay | Admitting: Otolaryngology

## 2012-06-08 DIAGNOSIS — H9319 Tinnitus, unspecified ear: Secondary | ICD-10-CM

## 2012-06-16 ENCOUNTER — Ambulatory Visit
Admission: RE | Admit: 2012-06-16 | Discharge: 2012-06-16 | Disposition: A | Discharge status: Home / Self Care | Payer: Medicare Other | Source: Ambulatory Visit | Attending: Otolaryngology | Admitting: Otolaryngology

## 2012-06-16 DIAGNOSIS — H9319 Tinnitus, unspecified ear: Secondary | ICD-10-CM

## 2012-06-16 MED ORDER — GADOBENATE DIMEGLUMINE 529 MG/ML IV SOLN
15.0000 mL | Freq: Once | INTRAVENOUS | Status: AC | PRN
  Administered 2012-06-16: 15 mL via INTRAVENOUS

## 2012-06-23 ENCOUNTER — Encounter: Payer: Self-pay | Admitting: Family Medicine

## 2012-10-04 ENCOUNTER — Other Ambulatory Visit: Payer: Self-pay | Admitting: *Deleted

## 2012-10-04 MED ORDER — LEVOTHYROXINE SODIUM 88 MCG PO TABS: 88.0000 ug | ORAL_TABLET | Freq: Every day | ORAL | Status: DC

## 2012-10-06 ENCOUNTER — Other Ambulatory Visit: Payer: Self-pay

## 2012-11-14 ENCOUNTER — Encounter: Payer: Self-pay | Admitting: Family Medicine

## 2012-11-14 ENCOUNTER — Ambulatory Visit (INDEPENDENT_AMBULATORY_CARE_PROVIDER_SITE_OTHER): Payer: Medicare Other | Admitting: Family Medicine

## 2012-11-14 VITALS — BP 120/64 | HR 72 | Temp 98.1°F | Ht 64.0 in | Wt 166.5 lb

## 2012-11-14 DIAGNOSIS — R200 Anesthesia of skin: Secondary | ICD-10-CM

## 2012-11-14 DIAGNOSIS — R209 Unspecified disturbances of skin sensation: Secondary | ICD-10-CM

## 2012-11-14 DIAGNOSIS — I83893 Varicose veins of bilateral lower extremities with other complications: Secondary | ICD-10-CM

## 2012-11-14 NOTE — Progress Notes (Signed)
Nature conservation officer at [facility] [address] [place] [address] Phone: [phone] Fax: [phone]  Date:  11/14/2012   Name:  Monica Irwin   DOB:  [birth year]   MRN:  [id number] Gender: female Age: 67 y.o.  Primary Physician:  Hannah Beat, MD  Evaluating MD: Hannah Beat, MD   Chief Complaint: blood vessel   History of Present Illness:  Monica Irwin is a 67 y.o. pleasant patient who presents with the following:  R leg varicosity. No pain.  She also has some numbness on her RIGHT great toe. She notices mostly when she is walking. No numbness in the foot or lower extremity. No back pain.  Patient Active Problem List   Diagnosis Date Noted  . Ear popping 05/10/2012  . DEPRESSIVE DISORDER 07/24/2010  . OSTEOPOROSIS 07/24/2010  . INSOMNIA 08/29/2009  . HYPERLIPIDEMIA 12/19/2008  . HYPOTHYROIDISM 07/02/2008  . OSTEOARTHRITIS 07/02/2008  . NEPHROLITHIASIS, HX OF 07/02/2008    Past Medical History  Diagnosis Date  . Hypothyroidism   . Nephrolithiasis   . Osteoarthritis   . History of colonic polyps   . Osteoporosis     Past Surgical History  Procedure Laterality Date  . Back surgery      [phone]  . Partial colectomy  2005  . Thyroidectomy  2008    History   Social History  . Marital Status: Married    Spouse Name: N/A    Number of Children: 1  . Years of Education: N/A   Occupational History  . retired [employer]    Social History Main Topics  . Smoking status: Never Smoker   . Smokeless tobacco: Never Used  . Alcohol Use: Yes     Comment: rarely  . Drug Use: No  . Sexually Active: Not on file   Other Topics Concern  . Not on file   Social History Narrative   Regular exercise--no             Family History  Problem Relation Age of Onset  . Stroke Mother   . Alzheimer's disease Mother     Allergies  Allergen Reactions  . Aspirin Nausea Only and Other (See Comments)   nausea and acid reflux    Medication list has been reviewed and updated.  Outpatient Prescriptions Prior to Visit  Medication Sig Dispense Refill  . Calcium Carbonate-Vitamin D (CALCIUM + D) 600-200 MG-UNIT TABS Take 2 tablets by mouth daily.        [name] glucosamine-chondroitin 500-400 MG tablet Take 1 tablet by mouth daily.        [name] levothyroxine (SYNTHROID, LEVOTHROID) 88 MCG tablet Take 1 tablet (88 mcg total) by mouth daily.  30 tablet  12  . fluticasone (FLONASE) 50 MCG/ACT nasal spray Place 2 sprays into the nose daily as needed.      . predniSONE (DELTASONE) 10 MG tablet As directed on instrcution sheet over twelve days  48 tablet  0   No facility-administered medications prior to visit.    Review of Systems:  As above, no fever, chills or sweats.   Physical Examination: BP 120/64  Pulse 72  Temp(Src) 98.1 F (36.7 C) (Oral)  Ht 5\' 4"  (1.626 m)  Wt 166 lb 8 oz (75.524 kg)  BMI 28.57 kg/m2  SpO2 97%  Ideal Body Weight: Weight in (lb) to have BMI = 25: 145.3   GEN: WDWN, NAD, Non-toxic, Alert & Oriented x 3 HEENT: Atraumatic, Normocephalic.  Ears and  Nose: No external deformity. EXTR: No clubbing/cyanosis/edema, Varcosity in question is on the posterior aspect of the RIGHT leg. There are others, as well. Pulses patent. DP and PT 2+ NEURO: Normal gait. R great toe with decreased sensation PSYCH: Normally interactive. Conversant. Not depressed or anxious appearing.  Calm demeanor.    Assessment and Plan: Varicose veins of lower extremities with other complications  Numbness of toes   Reassurance. Varicose vein Also reassured about her toe. Doubtful coming from spine. More likely nerve adjacent metatarsal.  Signed, Ersa Delaney T. Divon Krabill, MD 11/14/2012 10:02 AM

## 2013-03-05 ENCOUNTER — Ambulatory Visit (INDEPENDENT_AMBULATORY_CARE_PROVIDER_SITE_OTHER): Payer: Medicare Other | Admitting: Emergency Medicine

## 2013-03-05 VITALS — BP 130/84 | HR 76 | Temp 98.3°F | Resp 16 | Ht 64.0 in | Wt 163.0 lb

## 2013-03-05 DIAGNOSIS — H9202 Otalgia, left ear: Secondary | ICD-10-CM

## 2013-03-05 DIAGNOSIS — H9209 Otalgia, unspecified ear: Secondary | ICD-10-CM

## 2013-03-05 DIAGNOSIS — T169XXA Foreign body in ear, unspecified ear, initial encounter: Secondary | ICD-10-CM

## 2013-03-05 DIAGNOSIS — T162XXA Foreign body in left ear, initial encounter: Secondary | ICD-10-CM

## 2013-03-05 NOTE — Progress Notes (Signed)
[facility] [address], [place] [address] [phone]  Date:  03/05/2013   Name:  Monica Irwin   DOB:  [birth year]   MRN:  [id number]  PCP:  Hannah Beat, MD    Chief Complaint: Foreign Body in Ear   History of Present Illness:  Monica Irwin is a 67 y.o. very pleasant female patient who presents with the following:  Foreign body stuck in left ear.  Uses silicone plugs to prevent irritation from loud noises.  Now has pain in left ear following prolonged FB presence  No improvement with over the counter medications or other home remedies. Denies other complaint or health concern today.   Patient Active Problem List   Diagnosis Date Noted  . Ear popping 05/10/2012  . DEPRESSIVE DISORDER 07/24/2010  . OSTEOPOROSIS 07/24/2010  . INSOMNIA 08/29/2009  . HYPERLIPIDEMIA 12/19/2008  . HYPOTHYROIDISM 07/02/2008  . OSTEOARTHRITIS 07/02/2008  . NEPHROLITHIASIS, HX OF 07/02/2008    Past Medical History  Diagnosis Date  . Hypothyroidism   . Nephrolithiasis   . Osteoarthritis   . History of colonic polyps   . Osteoporosis     Past Surgical History  Procedure Laterality Date  . Back surgery      [phone]  . Partial colectomy  2005  . Thyroidectomy  2008  . Small intestine surgery      History  Substance Use Topics  . Smoking status: Never Smoker   . Smokeless tobacco: Never Used  . Alcohol Use: Yes     Comment: rarely    Family History  Problem Relation Age of Onset  . Stroke Mother   . Alzheimer's disease Mother     Allergies  Allergen Reactions  . Aspirin Nausea Only and Other (See Comments)    nausea and acid reflux    Medication list has been reviewed and updated.  Current Outpatient Prescriptions on File Prior to Visit  Medication Sig Dispense Refill  . Calcium Carbonate-Vitamin D (CALCIUM + D) 600-200 MG-UNIT TABS Take 2 tablets by mouth daily.        [name] glucosamine-chondroitin 500-400 MG tablet Take 1 tablet  by mouth daily.        [name] levothyroxine (SYNTHROID, LEVOTHROID) 88 MCG tablet Take 1 tablet (88 mcg total) by mouth daily.  30 tablet  12   No current facility-administered medications on file prior to visit.    Review of Systems:  As per HPI, otherwise negative.    Physical Examination: Filed Vitals:   03/05/13 1529  BP: 130/84  Pulse: 76  Temp: 98.3 F (36.8 C)  Resp: 16   Filed Vitals:   03/05/13 1529  Height: 5\' 4"  (1.626 m)  Weight: 163 lb (73.936 kg)   Body mass index is 27.97 kg/(m^2). Ideal Body Weight: Weight in (lb) to have BMI = 25: 145.3   GEN: WDWN, NAD, Non-toxic, Alert & Oriented x 3 HEENT: Atraumatic, Normocephalic.  Ears and Nose: No external deformity. EXTR: No clubbing/cyanosis/edema NEURO: Normal gait.  PSYCH: Normally interactive. Conversant. Not depressed or anxious appearing.  Calm demeanor.  LEFT EAR:  Obstructed by plug of soft silicone gel  Assessment and Plan: FB left ear Left ear pain Removed atraumatically under direct vision.   Signed,  Phillips Odor, MD

## 2013-05-18 ENCOUNTER — Other Ambulatory Visit: Payer: Self-pay | Admitting: Dermatology

## 2013-06-23 LAB — HM MAMMOGRAPHY: HM Mammogram: NORMAL

## 2013-06-26 ENCOUNTER — Encounter: Payer: Self-pay | Admitting: Family Medicine

## 2013-10-30 ENCOUNTER — Other Ambulatory Visit: Payer: Self-pay | Admitting: Family Medicine

## 2013-10-30 DIAGNOSIS — Z13220 Encounter for screening for lipoid disorders: Secondary | ICD-10-CM

## 2013-10-30 DIAGNOSIS — R5381 Other malaise: Secondary | ICD-10-CM

## 2013-10-30 DIAGNOSIS — M810 Age-related osteoporosis without current pathological fracture: Secondary | ICD-10-CM

## 2013-10-30 DIAGNOSIS — E039 Hypothyroidism, unspecified: Secondary | ICD-10-CM

## 2013-10-30 DIAGNOSIS — R5383 Other fatigue: Secondary | ICD-10-CM

## 2013-11-01 ENCOUNTER — Other Ambulatory Visit (INDEPENDENT_AMBULATORY_CARE_PROVIDER_SITE_OTHER): Payer: Medicare Other

## 2013-11-01 DIAGNOSIS — R5383 Other fatigue: Secondary | ICD-10-CM

## 2013-11-01 DIAGNOSIS — E039 Hypothyroidism, unspecified: Secondary | ICD-10-CM

## 2013-11-01 DIAGNOSIS — M810 Age-related osteoporosis without current pathological fracture: Secondary | ICD-10-CM

## 2013-11-01 DIAGNOSIS — E785 Hyperlipidemia, unspecified: Secondary | ICD-10-CM

## 2013-11-01 DIAGNOSIS — Z13220 Encounter for screening for lipoid disorders: Secondary | ICD-10-CM

## 2013-11-01 DIAGNOSIS — R5381 Other malaise: Secondary | ICD-10-CM

## 2013-11-01 LAB — CBC WITH DIFFERENTIAL/PLATELET
BASOS PCT: 0.8 % (ref 0.0–3.0)
Basophils Absolute: 0 10*3/uL (ref 0.0–0.1)
EOS ABS: 0.1 10*3/uL (ref 0.0–0.7)
EOS PCT: 2.7 % (ref 0.0–5.0)
HCT: 44.7 % (ref 36.0–46.0)
HEMOGLOBIN: 15 g/dL (ref 12.0–15.0)
LYMPHS PCT: 20.2 % (ref 12.0–46.0)
Lymphs Abs: 0.9 10*3/uL (ref 0.7–4.0)
MCHC: 33.6 g/dL (ref 30.0–36.0)
MCV: 94 fl (ref 78.0–100.0)
MONO ABS: 0.3 10*3/uL (ref 0.1–1.0)
Monocytes Relative: 7.5 % (ref 3.0–12.0)
NEUTROS PCT: 68.8 % (ref 43.0–77.0)
Neutro Abs: 3 10*3/uL (ref 1.4–7.7)
Platelets: 213 10*3/uL (ref 150.0–400.0)
RBC: 4.75 Mil/uL (ref 3.87–5.11)
RDW: 12.9 % (ref 11.5–14.6)
WBC: 4.4 10*3/uL — ABNORMAL LOW (ref 4.5–10.5)

## 2013-11-01 LAB — BASIC METABOLIC PANEL
BUN: 29 mg/dL — AB (ref 6–23)
CALCIUM: 9.5 mg/dL (ref 8.4–10.5)
CO2: 30 mEq/L (ref 19–32)
Chloride: 108 mEq/L (ref 96–112)
Creatinine, Ser: 0.7 mg/dL (ref 0.4–1.2)
GFR: 82.48 mL/min (ref >60.00)
GLUCOSE: 98 mg/dL (ref 70–99)
Potassium: 5.2 mEq/L — ABNORMAL HIGH (ref 3.5–5.1)
Sodium: 143 mEq/L (ref 135–145)

## 2013-11-01 LAB — HEPATIC FUNCTION PANEL
ALK PHOS: 65 U/L (ref 39–117)
ALT: 22 U/L (ref 0–35)
AST: 21 U/L (ref 0–37)
Albumin: 3.7 g/dL (ref 3.5–5.2)
BILIRUBIN DIRECT: 0.1 mg/dL (ref 0.0–0.3)
Total Bilirubin: 0.8 mg/dL (ref 0.3–1.2)
Total Protein: 6.4 g/dL (ref 6.0–8.3)

## 2013-11-01 LAB — LIPID PANEL
CHOLESTEROL: 215 mg/dL — AB (ref 0–200)
HDL: 53.5 mg/dL (ref >39.00)
LDL Cholesterol: 147 mg/dL — ABNORMAL HIGH (ref 0–99)
Total CHOL/HDL Ratio: 4
Triglycerides: 75 mg/dL (ref 0.0–149.0)
VLDL: 15 mg/dL (ref 0.0–40.0)

## 2013-11-01 LAB — TSH: TSH: 1.63 u[IU]/mL (ref 0.35–5.50)

## 2013-11-02 LAB — VITAMIN D 25 HYDROXY (VIT D DEFICIENCY, FRACTURES): VIT D 25 HYDROXY: 59 ng/mL (ref 30–89)

## 2013-11-08 ENCOUNTER — Ambulatory Visit (INDEPENDENT_AMBULATORY_CARE_PROVIDER_SITE_OTHER): Payer: Medicare Other | Admitting: Family Medicine

## 2013-11-08 ENCOUNTER — Encounter: Payer: Self-pay | Admitting: Family Medicine

## 2013-11-08 VITALS — BP 116/76 | HR 76 | Temp 98.1°F | Ht 64.25 in | Wt 161.5 lb

## 2013-11-08 DIAGNOSIS — E039 Hypothyroidism, unspecified: Secondary | ICD-10-CM

## 2013-11-08 DIAGNOSIS — R300 Dysuria: Secondary | ICD-10-CM

## 2013-11-08 DIAGNOSIS — N390 Urinary tract infection, site not specified: Secondary | ICD-10-CM

## 2013-11-08 DIAGNOSIS — Z23 Encounter for immunization: Secondary | ICD-10-CM

## 2013-11-08 DIAGNOSIS — Z0000 Encounter for general adult medical examination without abnormal findings: Secondary | ICD-10-CM

## 2013-11-08 LAB — POCT URINALYSIS DIPSTICK
Bilirubin, UA: NEGATIVE
Glucose, UA: NEGATIVE
Ketones, UA: NEGATIVE
NITRITE UA: NEGATIVE
PH UA: 7
Spec Grav, UA: 1.01
Urobilinogen, UA: 0.2

## 2013-11-08 MED ORDER — LEVOTHYROXINE SODIUM 88 MCG PO TABS: 88.0000 ug | ORAL_TABLET | Freq: Every day | ORAL | Status: DC

## 2013-11-08 MED ORDER — SULFAMETHOXAZOLE-TRIMETHOPRIM 400-80 MG PO TABS: 1.0000 | ORAL_TABLET | Freq: Two times a day (BID) | ORAL | Status: DC

## 2013-11-08 MED ORDER — FLUTICASONE PROPIONATE 50 MCG/ACT NA SUSP: 2.0000 | Freq: Every day | NASAL | Status: DC

## 2013-11-08 NOTE — Progress Notes (Signed)
Pre visit review using our clinic review tool, if applicable. No additional management support is needed unless otherwise documented below in the visit note. 

## 2013-11-08 NOTE — Progress Notes (Signed)
[place] [address] Phone: [phone] Fax: [phone]  Patient ID: Monica Irwin MRN: [id number], DOB: Mar 15, 1946, 68 y.o. Date of Encounter: 11/08/2013  Primary Physician:  Owens Loffler, MD   Chief Complaint: Annual Exam   Subjective:   History of Present Illness:  Monica Irwin is a 68 y.o. pleasant patient who presents for a medicare wellness examination:  Health Maintenance Summary Reviewed and updated, unless pt declines services.  Tobacco History Reviewed. Non-smoker Alcohol: No concerns, no excessive use Exercise Habits: Some activity, rec at least 30 mins 5 times a week STD concerns: none Drug Use: None Birth control method: n/a Menses regular: n/a Lumps or breast concerns: no Breast Cancer Family History: no  Urinating a lot.  Check UA. The patient has been having quite a bit of tissue area in frequency over the last few days. It is burning and hurting somewhat she urinates.   [id number]. Mammo 2014 Dr. Pamala Hurry, no further paps.  11/2010 - colon.   Health Maintenance  Topic Date Due  . Tetanus/tdap  07/14/2011  . Influenza Vaccine  02/10/2014  . Mammogram  06/24/2015  . Colonoscopy  11/24/2015  . Pneumococcal Polysaccharide Vaccine Age 56 And Over  Completed  . Zostavax  Completed    Immunization History  Administered Date(s) Administered  . Influenza Whole 04/10/2009, 03/13/2010  . Influenza-Unspecified 04/12/2013  . Pneumococcal Conjugate-13 11/08/2013  . Pneumococcal Polysaccharide-23 05/22/2010  . Td 07/13/2001  . Zoster 07/13/2001    Patient Active Problem List   Diagnosis Date Noted  . Major depressive disorder, recurrent episode, in full remission 07/24/2010  . OSTEOPOROSIS 07/24/2010  . INSOMNIA 08/29/2009  . HYPERLIPIDEMIA 12/19/2008  . HYPOTHYROIDISM 07/02/2008  . OSTEOARTHRITIS 07/02/2008  . NEPHROLITHIASIS, HX OF 07/02/2008    Past Medical History  Diagnosis Date  . Hypothyroidism   .  Nephrolithiasis   . Osteoarthritis   . History of colonic polyps   . Osteoporosis     Past Surgical History  Procedure Laterality Date  . Back surgery      [id number]  . Partial colectomy  2005  . Thyroidectomy  2008  . Small intestine surgery      History   Social History  . Marital Status: Single    Spouse Name: N/A    Number of Children: 1  . Years of Education: N/A   Occupational History  . retired [employer] History Main Topics  . Smoking status: Never Smoker   . Smokeless tobacco: Never Used  . Alcohol Use: Yes     Comment: rarely  . Drug Use: No  . Sexual Activity: Not on file   Other Topics Concern  . Not on file   Social History Narrative   Regular exercise--no             Family History  Problem Relation Age of Onset  . Stroke Mother   . Alzheimer's disease Mother     Allergies  Allergen Reactions  . Aspirin Nausea Only and Other (See Comments)    nausea and acid reflux   Medication list has been reviewed and updated.  Review of Systems:  General: Denies fever, chills, sweats. No significant weight loss. Eyes: Denies blurring,significant itching ENT: Denies earache, sore throat, and hoarseness.  Cardiovascular: Denies chest pains, palpitations, dyspnea on exertion,  Respiratory: Denies cough, dyspnea at rest,wheeezing Breast: no concerns about lumps GI: Denies nausea, vomiting, diarrhea, constipation, change in bowel habits, abdominal  pain, melena, hematochezia GU: Denies dysuria, hematuria, urinary hesitancy, nocturia, denies STD risk, no concerns about discharge Musculoskeletal: Denies back pain, joint pain Derm: Denies rash, itching Neuro: Denies  paresthesias, frequent falls, frequent headaches Psych: Denies depression, anxiety Endocrine: Denies cold intolerance, heat intolerance, polydipsia Heme: Denies enlarged lymph nodes Allergy: No hayfever  Objective:   Physical Examination: BP  116/76  Pulse 76  Temp(Src) 98.1 F (36.7 C) (Oral)  Ht 5' 4.25" (1.632 m)  Wt 161 lb 8 oz (73.256 kg)  BMI 27.50 kg/m2  SpO2 94%  GEN: well developed, well nourished, no acute distress Eyes: conjunctiva and lids normal, PERRLA, EOMI ENT: TM clear, nares clear, oral exam WNL Neck: supple, no lymphadenopathy, no thyromegaly, no JVD Pulm: clear to auscultation and percussion, respiratory effort normal CV: regular rate and rhythm, S1-S2, no murmur, rub or gallop, no bruits Chest: no scars, masses, no lumps BREAST: breast exam declined GI: soft, non-tender; no hepatosplenomegaly, masses; active bowel sounds all quadrants GU: GU exam declined Lymph: no cervical, axillary or inguinal adenopathy MSK: gait normal, muscle tone and strength WNL, no joint swelling, effusions, discoloration, crepitus  SKIN: clear, good turgor, color WNL, no rashes, lesions, or ulcerations Neuro: normal mental status, normal strength, sensation, and motion Psych: alert; oriented to person, place and time, normally interactive and not anxious or depressed in appearance.   All labs reviewed with patient. Lipids:    Component Value Date/Time   CHOL 215* 11/01/2013 0754   TRIG 75.0 11/01/2013 0754   HDL 53.50 11/01/2013 0754   LDLDIRECT 147.5 03/02/2012 0912   VLDL 15.0 11/01/2013 0754   CHOLHDL 4 11/01/2013 0754   CBC:    Component Value Date/Time   WBC 4.4* 11/01/2013 0754   HGB 15.0 11/01/2013 0754   HCT 44.7 11/01/2013 0754   PLT 213.0 11/01/2013 0754   MCV 94.0 11/01/2013 0754   NEUTROABS 3.0 11/01/2013 0754   LYMPHSABS 0.9 11/01/2013 0754   MONOABS 0.3 11/01/2013 0754   EOSABS 0.1 11/01/2013 0754   BASOSABS 0.0 11/01/2013 0973   Basic Metabolic Panel:    Component Value Date/Time   NA 143 11/01/2013 0754   K 5.2* 11/01/2013 0754   CL 108 11/01/2013 0754   CO2 30 11/01/2013 0754   BUN 29* 11/01/2013 0754   CREATININE 0.7 11/01/2013 0754   GLUCOSE 98 11/01/2013 0754   CALCIUM 9.5 11/01/2013 0754   Lab  Results  Component Value Date   ALT 22 11/01/2013   AST 21 11/01/2013   ALKPHOS 65 11/01/2013   BILITOT 0.8 11/01/2013   Lab Results  Component Value Date   TSH 1.63 11/01/2013   Results for orders placed in visit on 11/08/13  URINE CULTURE      Result Value Ref Range   Colony Count >=100,000 COLONIES/ML     Preliminary Report Gram Negative Rods    POCT URINALYSIS DIPSTICK      Result Value Ref Range   Color, UA yellow     Clarity, UA clear     Glucose, UA negative     Bilirubin, UA negative     Ketones, UA negative     Spec Grav, UA 1.010     Blood, UA MODERATE     pH, UA 7.0     Protein, UA TRACE     Urobilinogen, UA 0.2     Nitrite, UA negative     Leukocytes, UA moderate (2+)       Assessment & Plan:   Routine  general medical examination at a health care facility  Dysuria - Plan: POCT Urinalysis Dipstick, Urine culture  Need for prophylactic vaccination against Streptococcus pneumoniae (pneumococcus) - Plan: Pneumococcal conjugate vaccine 13-valent  UTI (urinary tract infection): appears to have gram negative UTI, place on septra and await culture.  HYPOTHYROIDISM: stable  Health Maintenance Exam: The patient's preventative maintenance and recommended screening tests for an annual wellness exam were reviewed in full today. Brought up to date unless services declined.  Counselled on the importance of diet, exercise, and its role in overall health and mortality. The patient's FH and SH was reviewed, including their home life, tobacco status, and drug and alcohol status.  I have personally reviewed the Medicare Annual Wellness questionnaire and have noted 1. The patient's medical and social history 2. Their use of alcohol, tobacco or illicit drugs 3. Their current medications and supplements 4. The patient's functional ability including ADL's, fall risks, home safety risks and hearing or visual             impairment. 5. Diet and physical activities 6. Evidence  for depression or mood disorders  The patients weight, height, BMI and visual acuity have been recorded in the chart I have made referrals, counseling and provided education to the patient based review of the above and I have provided the pt with a written personalized care plan for preventive services.  I have provided the patient with a copy of your personalized plan for preventive services. Instructed to take the time to review along with their updated medication list.  Additional information on Medicare Wellness paperwork is scanned into the chart.   Follow-up: No Follow-up on file. Or follow-up in 1 year for complete physical examination  New Prescriptions   FLUTICASONE (FLONASE) 50 MCG/ACT NASAL SPRAY    Place 2 sprays into both nostrils daily.   SULFAMETHOXAZOLE-TRIMETHOPRIM (BACTRIM,SEPTRA) 400-80 MG PER TABLET    Take 1 tablet by mouth 2 (two) times daily.   Orders Placed This Encounter  Procedures  . Urine culture  . Pneumococcal conjugate vaccine 13-valent  . POCT Urinalysis Dipstick    Signed,  Frederico Hamman T. Prospero Mahnke, MD, [facility]  Patient's Medications  New Prescriptions   FLUTICASONE (FLONASE) 50 MCG/ACT NASAL SPRAY    Place 2 sprays into both nostrils daily.   SULFAMETHOXAZOLE-TRIMETHOPRIM (BACTRIM,SEPTRA) 400-80 MG PER TABLET    Take 1 tablet by mouth 2 (two) times daily.  Previous Medications   CALCIUM CARBONATE-VITAMIN D (CALCIUM + D) 600-200 MG-UNIT TABS    Take 2 tablets by mouth daily.     GLUCOSAMINE-CHONDROITIN 500-400 MG TABLET    Take 1 tablet by mouth daily.    Modified Medications   Modified Medication Previous Medication   LEVOTHYROXINE (SYNTHROID, LEVOTHROID) 88 MCG TABLET levothyroxine (SYNTHROID, LEVOTHROID) 88 MCG tablet      Take 1 tablet (88 mcg total) by mouth daily before breakfast.    TAKE ONE (1) TABLET BY MOUTH EVERY DAY  Discontinued Medications   CLINDAMYCIN (CLEOCIN T) 1 % LOTION

## 2013-11-11 LAB — URINE CULTURE: Colony Count: >=100000

## 2014-04-17 ENCOUNTER — Other Ambulatory Visit: Payer: Self-pay

## 2014-08-15 ENCOUNTER — Ambulatory Visit (INDEPENDENT_AMBULATORY_CARE_PROVIDER_SITE_OTHER): Payer: 59 | Admitting: Family Medicine

## 2014-08-15 ENCOUNTER — Encounter: Payer: Self-pay | Admitting: Family Medicine

## 2014-08-15 VITALS — BP 112/65 | HR 73 | Temp 98.8°F | Ht 64.25 in | Wt 159.5 lb

## 2014-08-15 DIAGNOSIS — J0101 Acute recurrent maxillary sinusitis: Secondary | ICD-10-CM

## 2014-08-15 MED ORDER — AMOXICILLIN-POT CLAVULANATE 875-125 MG PO TABS: 1.0000 | ORAL_TABLET | Freq: Two times a day (BID) | ORAL | Status: DC

## 2014-08-15 NOTE — Progress Notes (Signed)
Pre visit review using our clinic review tool, if applicable. No additional management support is needed unless otherwise documented below in the visit note. 

## 2014-08-15 NOTE — Progress Notes (Signed)
   Dr. Frederico Hamman T. Damar Petit, MD, [facility] [place], [address] Phone: [phone] Fax: [phone]  08/15/2014  Patient: Monica Irwin, MRN: [id number], DOB: 08-15-1945, 69 y.o.  Primary Physician:  Owens Loffler, MD  Chief Complaint: Sinusitis  Subjective:   Monica Irwin is a 69 y.o. very pleasant female patient who presents with the following:  Blowing nose constantly, teeth hurt on the top. Had a bad headache. Started to feel bad the first of last week. She has been sick now for about 10-14 days. She basically has not had any kind of URI symptoms, but baseline she does have some significant allergy symptoms. She does have frequent sinusitis. Over the last week she has had worsening symptoms were she has pain in her for that, and additionally in the maxillary region and in her teeth. This is gotten progressively worse. She thought she had a fever last night  Past Medical History, Surgical History, Social History, Family History, Problem List, Medications, and Allergies have been reviewed and updated if relevant.  ROS: GEN: Acute illness details above GI: Tolerating PO intake GU: maintaining adequate hydration and urination Pulm: No SOB Interactive and getting along well at home.  Otherwise, ROS is as per the HPI.   Objective:   BP 112/65 mmHg  Pulse 73  Temp(Src) 98.8 F (37.1 C) (Oral)  Ht 5' 4.25" (1.632 m)  Wt 159 lb 8 oz (72.349 kg)  BMI 27.16 kg/m2   Gen: WDWN, NAD; alert,appropriate and cooperative throughout exam  HEENT: Normocephalic and atraumatic. Throat clear, w/o exudate, no LAD, R TM clear, L TM - good landmarks, No fluid present. rhinnorhea.  Left frontal and maxillary sinuses: Tender, max Right frontal and maxillary sinuses: Tender, max  Neck: No ant or post LAD CV: RRR, No M/G/R Pulm: Breathing comfortably in no resp distress. no w/c/r Abd: S,NT,ND,+BS Extr: no c/c/e Psych: full  affect, pleasant    Laboratory and Imaging Data:  Assessment and Plan:   Acute recurrent maxillary sinusitis  Acute sinusitis: ABX as below.   Reviewed symptomatic care as well as ABX in this case.    Follow-up: No Follow-up on file.  New Prescriptions   AMOXICILLIN-CLAVULANATE (AUGMENTIN) 875-125 MG PER TABLET    Take 1 tablet by mouth 2 (two) times daily.   No orders of the defined types were placed in this encounter.    Signed,  Maud Deed. Nabila Albarracin, MD   Patient's Medications  New Prescriptions   AMOXICILLIN-CLAVULANATE (AUGMENTIN) 875-125 MG PER TABLET    Take 1 tablet by mouth 2 (two) times daily.  Previous Medications   CALCIUM CARBONATE-VITAMIN D (CALCIUM + D) 600-200 MG-UNIT TABS    Take 2 tablets by mouth daily.     FLUTICASONE (FLONASE) 50 MCG/ACT NASAL SPRAY    Place 2 sprays into both nostrils daily.   GLUCOSAMINE-CHONDROITIN 500-400 MG TABLET    Take 1 tablet by mouth daily.     LEVOTHYROXINE (SYNTHROID, LEVOTHROID) 88 MCG TABLET    Take 1 tablet (88 mcg total) by mouth daily before breakfast.  Modified Medications   No medications on file  Discontinued Medications   SULFAMETHOXAZOLE-TRIMETHOPRIM (BACTRIM,SEPTRA) 400-80 MG PER TABLET    Take 1 tablet by mouth 2 (two) times daily.

## 2014-08-21 ENCOUNTER — Encounter: Payer: Self-pay | Admitting: Family Medicine

## 2014-09-03 ENCOUNTER — Telehealth: Payer: Self-pay | Admitting: Family Medicine

## 2014-09-03 NOTE — Telephone Encounter (Signed)
Pt called wanting to talk to donna Pt stated she is not getting any better from her visit on 08/16/14 And wanted to know if something else could be called in for her or does she need to make an appointment  [facility]

## 2014-09-03 NOTE — Telephone Encounter (Signed)
Spoke with Monica Irwin.  She was seen on 08/15/2014 for Sinusitis.  She is still having pain on the right side of her head.  She is concerned because she has cataract surgery on the right eye on 07/11/2014 which was a Friday.  On Saturday she states she got up to go to the bathroom and had 3 to 4 sharpe pains to run across the top of her head.  On Monday she went back in to see the eye doctor which diagnosed her with eye irritation and prescribed some drops for it and that cleared up. She is just concerned because she doesn't know if this is truly a sinus issue or something related to her cataract surgery.  Appointment scheduled with Dr. Lorelei Pont for 09/12/2014 at 8:00 am.  She ask to be called if Dr. Lorelei Pont has a cancellation for a sooner appointment.  I did offer her an appointment to see Dr. Diona Browner tomorrow but she wanted to wait and see Dr. Lorelei Pont.

## 2014-09-03 NOTE — Telephone Encounter (Signed)
i would rather reevaluate her. I am sorry I am so overbooked.   If she will not see one of my partners, then please call me if she develops any fever at all or headaches worsen.

## 2014-09-04 NOTE — Telephone Encounter (Signed)
Ms. Morriss notified as instructed by telephone.

## 2014-09-05 ENCOUNTER — Encounter: Payer: Self-pay | Admitting: Family Medicine

## 2014-09-05 ENCOUNTER — Ambulatory Visit (INDEPENDENT_AMBULATORY_CARE_PROVIDER_SITE_OTHER): Payer: Medicare Other | Admitting: Family Medicine

## 2014-09-05 VITALS — BP 110/66 | HR 77 | Temp 98.5°F | Ht 64.25 in | Wt 161.5 lb

## 2014-09-05 DIAGNOSIS — R519 Headache, unspecified: Secondary | ICD-10-CM

## 2014-09-05 DIAGNOSIS — J0101 Acute recurrent maxillary sinusitis: Secondary | ICD-10-CM

## 2014-09-05 DIAGNOSIS — R51 Headache: Secondary | ICD-10-CM

## 2014-09-05 MED ORDER — LEVOFLOXACIN 500 MG PO TABS: 500.0000 mg | ORAL_TABLET | Freq: Every day | ORAL | Status: DC

## 2014-09-05 MED ORDER — PREDNISONE 20 MG PO TABS: 20.0000 mg | ORAL_TABLET | Freq: Every day | ORAL | Status: DC

## 2014-09-05 NOTE — Progress Notes (Signed)
Dr. Frederico Hamman T. Luccas Towell, MD, [facility] [place], [address] Phone: [phone] Fax: [phone]  09/05/2014  Patient: Monica Irwin, MRN: [id number], DOB: [birth year], 69 y.o.  Primary Physician:  Owens Loffler, MD  Chief Complaint: Pain in Right Side of Head  Subjective:   Monica Irwin is a 69 y.o. very pleasant female patient who presents with the following:  R side has never opened up. Has ben hurting the whole time, woke up 3-4 times from R sided headaches.  Has some SAD. She has been having some intermittent right headaches since the end of December.  Notably she did have a right-sided cataract operation at that time.  She did follow up about a week after her operation, and her ophthalmologist thought that everything was going fine and nothing was abnormal on his examination.  She did get some drops.  I saw her a couple of weeks ago for presumed acute sinusitis and treated her with some Augmentin.  She has some persistent headache and pain, more on the frontal region and the right side.  She also has some pain generally on the right side in the back.  She has no neurological changes.  07/13/2014 or so, s/u with opthalmology  Past Medical History, Surgical History, Social History, Family History, Problem List, Medications, and Allergies have been reviewed and updated if relevant.  ROS: GEN: Acute illness details above GI: Tolerating PO intake GU: maintaining adequate hydration and urination Pulm: No SOB Interactive and getting along well at home.  Otherwise, ROS is as per the HPI.   Objective:   BP 110/66 mmHg  Pulse 77  Temp(Src) 98.5 F (36.9 C) (Oral)  Ht 5' 4.25" (1.632 m)  Wt 161 lb 8 oz (73.256 kg)  BMI 27.50 kg/m2   Gen: WDWN, NAD; alert,appropriate and cooperative throughout exam  HEENT: Normocephalic and atraumatic. Throat clear, w/o exudate, no LAD, R TM clear, L TM - good landmarks, No  fluid present. rhinnorhea.  Left frontal and maxillary sinuses: non-Tender Right frontal and maxillary sinuses: Tender, frontal  Neck: No ant or post LAD CV: RRR, No M/G/R Pulm: Breathing comfortably in no resp distress. no w/c/r Abd: S,NT,ND,+BS Extr: no c/c/e Psych: full affect, pleasant  Laboratory and Imaging Data:  Assessment and Plan:   Right-sided headache  Acute recurrent maxillary sinusitis  Not entirely sure, but my best assessment is that this is more likely a incompletely treated sinusitis.  I'm going to give her some Levaquin as well as prednisone.  If she is not improved in the completion of her antibiotics, I think that reassessment by her ophthalmologist would also be wise.  She also has been more stressed, she does have some seasonal affective disorder, and she has been a little bit more stressed and down recently.  This could contribute to some tension headaches.  Follow-up: No Follow-up on file.  New Prescriptions   LEVOFLOXACIN (LEVAQUIN) 500 MG TABLET    Take 1 tablet (500 mg total) by mouth daily.   PREDNISONE (DELTASONE) 20 MG TABLET    Take 1 tablet (20 mg total) by mouth daily with breakfast.   No orders of the defined types were placed in this encounter.    Signed,  Maud Deed. Rossana Molchan, MD   Patient's Medications  New Prescriptions   LEVOFLOXACIN (LEVAQUIN) 500 MG TABLET    Take 1 tablet (500 mg total) by mouth daily.   PREDNISONE (DELTASONE) 20 MG TABLET  Take 1 tablet (20 mg total) by mouth daily with breakfast.  Previous Medications   CALCIUM CARBONATE-VITAMIN D (CALCIUM + D) 600-200 MG-UNIT TABS    Take 2 tablets by mouth daily.     FLUTICASONE (FLONASE) 50 MCG/ACT NASAL SPRAY    Place 2 sprays into both nostrils daily.   GLUCOSAMINE-CHONDROITIN 500-400 MG TABLET    Take 1 tablet by mouth daily.     LEVOTHYROXINE (SYNTHROID, LEVOTHROID) 88 MCG TABLET    Take 1 tablet (88 mcg total) by mouth daily before breakfast.  Modified Medications    No medications on file  Discontinued Medications   AMOXICILLIN-CLAVULANATE (AUGMENTIN) 875-125 MG PER TABLET    Take 1 tablet by mouth 2 (two) times daily.

## 2014-09-05 NOTE — Progress Notes (Signed)
Pre visit review using our clinic review tool, if applicable. No additional management support is needed unless otherwise documented below in the visit note. 

## 2014-09-12 ENCOUNTER — Ambulatory Visit: Payer: 59 | Admitting: Family Medicine

## 2014-11-28 ENCOUNTER — Other Ambulatory Visit: Payer: Self-pay | Admitting: Family Medicine

## 2014-11-28 NOTE — Telephone Encounter (Signed)
Please call and schedule Medicare Wellness with fasting labs prior with Dr. Lorelei Pont sometime in the next three months.

## 2014-11-28 NOTE — Telephone Encounter (Signed)
Appointment 7/29 cpx 8/3 Pt aware

## 2015-02-04 ENCOUNTER — Other Ambulatory Visit: Payer: Self-pay | Admitting: Family Medicine

## 2015-02-04 DIAGNOSIS — R5383 Other fatigue: Secondary | ICD-10-CM

## 2015-02-04 DIAGNOSIS — E785 Hyperlipidemia, unspecified: Secondary | ICD-10-CM

## 2015-02-04 DIAGNOSIS — E039 Hypothyroidism, unspecified: Secondary | ICD-10-CM

## 2015-02-08 ENCOUNTER — Other Ambulatory Visit (INDEPENDENT_AMBULATORY_CARE_PROVIDER_SITE_OTHER): Payer: Medicare Other

## 2015-02-08 DIAGNOSIS — R5383 Other fatigue: Secondary | ICD-10-CM | POA: Diagnosis not present

## 2015-02-08 DIAGNOSIS — E785 Hyperlipidemia, unspecified: Secondary | ICD-10-CM

## 2015-02-08 DIAGNOSIS — E039 Hypothyroidism, unspecified: Secondary | ICD-10-CM | POA: Diagnosis not present

## 2015-02-08 LAB — CBC WITH DIFFERENTIAL/PLATELET
Basophils Absolute: 0 10*3/uL (ref 0.0–0.1)
Basophils Relative: 0.7 % (ref 0.0–3.0)
EOS ABS: 0.1 10*3/uL (ref 0.0–0.7)
Eosinophils Relative: 2.7 % (ref 0.0–5.0)
HEMATOCRIT: 44.5 % (ref 36.0–46.0)
Hemoglobin: 15 g/dL (ref 12.0–15.0)
Lymphocytes Relative: 21.5 % (ref 12.0–46.0)
Lymphs Abs: 0.8 10*3/uL (ref 0.7–4.0)
MCHC: 33.8 g/dL (ref 30.0–36.0)
MCV: 93.6 fl (ref 78.0–100.0)
Monocytes Absolute: 0.4 10*3/uL (ref 0.1–1.0)
Monocytes Relative: 9.5 % (ref 3.0–12.0)
Neutro Abs: 2.6 10*3/uL (ref 1.4–7.7)
Neutrophils Relative %: 65.6 % (ref 43.0–77.0)
Platelets: 216 10*3/uL (ref 150.0–400.0)
RBC: 4.75 Mil/uL (ref 3.87–5.11)
RDW: 13.1 % (ref 11.5–15.5)
WBC: 3.9 10*3/uL — ABNORMAL LOW (ref 4.0–10.5)

## 2015-02-08 LAB — BASIC METABOLIC PANEL
BUN: 24 mg/dL — AB (ref 6–23)
CALCIUM: 9.3 mg/dL (ref 8.4–10.5)
CHLORIDE: 106 meq/L (ref 96–112)
CO2: 29 mEq/L (ref 19–32)
CREATININE: 0.82 mg/dL (ref 0.40–1.20)
GFR: 73 mL/min (ref >60.00)
Glucose, Bld: 93 mg/dL (ref 70–99)
POTASSIUM: 4.3 meq/L (ref 3.5–5.1)
Sodium: 141 mEq/L (ref 135–145)

## 2015-02-08 LAB — HEPATIC FUNCTION PANEL
ALBUMIN: 4.1 g/dL (ref 3.5–5.2)
ALK PHOS: 68 U/L (ref 39–117)
ALT: 19 U/L (ref 0–35)
AST: 18 U/L (ref 0–37)
BILIRUBIN TOTAL: 0.5 mg/dL (ref 0.2–1.2)
Bilirubin, Direct: 0.1 mg/dL (ref 0.0–0.3)
TOTAL PROTEIN: 6.6 g/dL (ref 6.0–8.3)

## 2015-02-08 LAB — LIPID PANEL
Cholesterol: 206 mg/dL — ABNORMAL HIGH (ref 0–200)
HDL: 53.5 mg/dL (ref >39.00)
LDL CALC: 136 mg/dL — AB (ref 0–99)
NonHDL: 152.23
TRIGLYCERIDES: 82 mg/dL (ref 0.0–149.0)
Total CHOL/HDL Ratio: 4
VLDL: 16.4 mg/dL (ref 0.0–40.0)

## 2015-02-08 LAB — TSH: TSH: 0.96 u[IU]/mL (ref 0.35–4.50)

## 2015-02-13 ENCOUNTER — Encounter: Payer: Self-pay | Admitting: Family Medicine

## 2015-02-13 ENCOUNTER — Ambulatory Visit (INDEPENDENT_AMBULATORY_CARE_PROVIDER_SITE_OTHER): Payer: Medicare Other | Admitting: Family Medicine

## 2015-02-13 VITALS — BP 104/62 | HR 68 | Temp 98.4°F | Ht 64.0 in | Wt 156.5 lb

## 2015-02-13 DIAGNOSIS — M8580 Other specified disorders of bone density and structure, unspecified site: Secondary | ICD-10-CM

## 2015-02-13 DIAGNOSIS — J3489 Other specified disorders of nose and nasal sinuses: Secondary | ICD-10-CM

## 2015-02-13 DIAGNOSIS — Z0000 Encounter for general adult medical examination without abnormal findings: Secondary | ICD-10-CM | POA: Diagnosis not present

## 2015-02-13 NOTE — Progress Notes (Signed)
Pre visit review using our clinic review tool, if applicable. No additional management support is needed unless otherwise documented below in the visit note. 

## 2015-02-13 NOTE — Progress Notes (Signed)
Dr. Frederico Hamman T. , MD, [facility] [place], [address] Phone: [phone] Fax: [phone]  02/13/2015  Patient: Monica Irwin, MRN: [id number], DOB: 07-18-1945, 69 y.o.  Primary Physician:  Owens Loffler, MD  Chief Complaint: Annual Exam  Subjective:   Monica Irwin is a 69 y.o. pleasant patient who presents for a medicare wellness examination:  Health Maintenance Summary Reviewed and updated, unless pt declines services.  Tobacco History Reviewed. Non-smoker Alcohol: No concerns, no excessive use Exercise Habits: Some activity, rec at least 30 mins 5 times a week STD concerns: none Drug Use: None Birth control method: n/a Menses regular: n/a Lumps or breast concerns: no Breast Cancer Family History: no  DEXA - f/u at [facility] breast center  Ever since cataract surgery, has had some pain in her eyes / sinus region - optho found absolutely nothing. Will have it basically all the time. Ongoing for months - since December.  Ongoing since December.    Health Maintenance  Topic Date Due  . DEXA SCAN  12/01/2008  . TETANUS/TDAP  07/14/2011  . INFLUENZA VACCINE  02/11/2015  . COLONOSCOPY  11/24/2015  . MAMMOGRAM  08/18/2016  . ZOSTAVAX  Completed  . PNA vac Low Risk Adult  Completed   Immunization History  Administered Date(s) Administered  . Influenza Whole 04/10/2009, 03/13/2010  . Influenza-Unspecified 04/12/2013  . Pneumococcal Conjugate-13 11/08/2013  . Pneumococcal Polysaccharide-23 05/22/2010  . Td 07/13/2001  . Zoster 07/13/2001    Patient Active Problem List   Diagnosis Date Noted  . Major depressive disorder, recurrent episode, in full remission 07/24/2010  . OSTEOPOROSIS 07/24/2010  . INSOMNIA 08/29/2009  . HYPERLIPIDEMIA 12/19/2008  . HYPOTHYROIDISM 07/02/2008  . OSTEOARTHRITIS 07/02/2008  . NEPHROLITHIASIS, HX OF 07/02/2008   Past Medical History  Diagnosis Date  .  Hypothyroidism   . Nephrolithiasis   . Osteoarthritis   . History of colonic polyps   . Osteoporosis    Past Surgical History  Procedure Laterality Date  . Back surgery      [phone]  . Partial colectomy  2005  . Thyroidectomy  2008  . Small intestine surgery     History   Social History  . Marital Status: Single    Spouse Name: N/A  . Number of Children: 1  . Years of Education: N/A   Occupational History  . retired [employer] History Main Topics  . Smoking status: Never Smoker   . Smokeless tobacco: Never Used  . Alcohol Use: Yes     Comment: rarely  . Drug Use: No  . Sexual Activity: Not on file   Other Topics Concern  . Not on file   Social History Narrative   Regular exercise--no            Family History  Problem Relation Age of Onset  . Stroke Mother   . Alzheimer's disease Mother    Allergies  Allergen Reactions  . Aspirin Nausea Only and Other (See Comments)    nausea and acid reflux    Medication list has been reviewed and updated.   General: Denies fever, chills, sweats. No significant weight loss. HEADACHES Eyes: Denies blurring,significant itching ENT: Denies earache, sore throat, and hoarseness.  Cardiovascular: Denies chest pains, palpitations, dyspnea on exertion,  Respiratory: Denies cough, dyspnea at rest,wheeezing Breast: no concerns about lumps GI: Denies nausea, vomiting, diarrhea, constipation, change in bowel habits, abdominal pain, melena,  hematochezia GU: Denies dysuria, hematuria, urinary hesitancy, nocturia, denies STD risk, no concerns about discharge Musculoskeletal: Denies back pain, joint pain Derm: Denies rash, itching Neuro: Denies  paresthesias, frequent falls, + Frequent headaches Psych: Denies depression, anxiety Endocrine: Denies cold intolerance, heat intolerance, polydipsia Heme: Denies enlarged lymph nodes Allergy: No hayfever  Objective:   BP 104/62 mmHg  Pulse  68  Temp(Src) 98.4 F (36.9 C) (Oral)  Ht 5' 4" (1.626 m)  Wt 156 lb 8 oz (70.988 kg)  BMI 26.85 kg/m2  The patient completed a fall screen and PHQ-2 and PHQ-9 if necessary, which is documented in the EHR. The CMA/LPN/RN who assisted the patient verbally completed with them and documented results in [facility] EHR.   Hearing Screening   Method: Audiometry   125Hz 250Hz 500Hz 1000Hz 2000Hz 4000Hz 8000Hz  Right ear:   0 40 20 0   Left ear:   _0 0   Vision Screening Comments: Eye Exam with Dr. Marygrace Drought 01/15/2015 Bilateral Cataract Surgery  GEN: well developed, well nourished, no acute distress Eyes: conjunctiva and lids normal, PERRLA, EOMI ENT: TM clear, nares clear, oral exam WNL. SINUSES TTP FRONTAL AND MAX Neck: supple, no lymphadenopathy, no thyromegaly, no JVD Pulm: clear to auscultation and percussion, respiratory effort normal CV: regular rate and rhythm, S1-S2, no murmur, rub or gallop, no bruits Chest: no scars, masses, no lumps BREAST: breast exam declined GI: soft, non-tender; no hepatosplenomegaly, masses; active bowel sounds all quadrants GU: GU exam declined Lymph: no cervical, axillary or inguinal adenopathy MSK: gait normal, muscle tone and strength WNL, no joint swelling, effusions, discoloration, crepitus  SKIN: clear, good turgor, color WNL, no rashes, lesions, or ulcerations Neuro: normal mental status, normal strength, sensation, and motion Psych: alert; oriented to person, place and time, normally interactive and not anxious or depressed in appearance.   All labs reviewed with patient. Lipids:    Component Value Date/Time   CHOL 206* 02/08/2015 0821   TRIG 82.0 02/08/2015 0821   HDL 53.50 02/08/2015 0821   LDLDIRECT 147.5 03/02/2012 0912   VLDL 16.4 02/08/2015 0821   CHOLHDL 4 02/08/2015 0821   CBC: CBC Latest Ref Rng 02/08/2015 11/01/2013 03/02/2012  WBC 4.0 - 10.5 K/uL 3.9(L) 4.4(L) 4.6  Hemoglobin 12.0 - 15.0 g/dL 15.0 15.0 14.6    Hematocrit 36.0 - 46.0 % 44.5 44.7 44.4  Platelets 150.0 - 400.0 K/uL 216.0 213.0 220.2    Basic Metabolic Panel:    Component Value Date/Time   NA 141 02/08/2015 0821   K 4.3 02/08/2015 0821   CL 106 02/08/2015 0821   CO2 29 02/08/2015 0821   BUN 24* 02/08/2015 0821   CREATININE 0.82 02/08/2015 0821   GLUCOSE 93 02/08/2015 0821   CALCIUM 9.3 02/08/2015 0821   Hepatic Function Latest Ref Rng 02/08/2015 11/01/2013 03/02/2012  Total Protein 6.0 - 8.3 g/dL 6.6 6.4 6.6  Albumin 3.5 - 5.2 g/dL 4.1 3.7 4.0  AST 0 - 37 U/L _1 ALT 0 - 35 U/L _2 Alk Phosphatase 39 - 117 U/L 68 65 55  Total Bilirubin 0.2 - 1.2 mg/dL 0.5 0.8 0.6  Bilirubin, Direct 0.0 - 0.3 mg/dL 0.1 0.1 0.0    Lab Results  Component Value Date   TSH 0.96 02/08/2015    No results found.  Assessment and Plan:   Healthcare maintenance  Sinus pain - Plan: CT MAXILLOFACIAL LTD WO CM, CANCELED: CT Maxillofacial WO CM  Osteopenia -  Plan: DG Bone Density   With 9 months of frontal headache and pain with palpation, obtain a CT of the sinuses to evaluate for chronic sinusitis.   Health Maintenance Exam: The patient's preventative maintenance and recommended screening tests for an annual wellness exam were reviewed in full today. Brought up to date unless services declined.  Counselled on the importance of diet, exercise, and its role in overall health and mortality. The patient's FH and SH was reviewed, including their home life, tobacco status, and drug and alcohol status.  I have personally reviewed the Medicare Annual Wellness questionnaire and have noted 1. The patient's medical and social history 2. Their use of alcohol, tobacco or illicit drugs 3. Their current medications and supplements 4. The patient's functional ability including ADL's, fall risks, home safety risks and hearing or visual             impairment. 5. Diet and physical activities 6. Evidence for depression or mood  disorders 7. Reviewed Updated provider list, see scanned forms and [facility] Snapshot.   The patients weight, height, BMI and visual acuity have been recorded in the chart I have made referrals, counseling and provided education to the patient based review of the above and I have provided the pt with a written personalized care plan for preventive services.  I have provided the patient with a copy of your personalized plan for preventive services. Instructed to take the time to review along with their updated medication list.  Follow-up: No Follow-up on file. Or follow-up in 1 year for complete physical examination  New Prescriptions   No medications on file   Orders Placed This Encounter  Procedures  . DG Bone Density  . CT MAXILLOFACIAL LTD WO CM    Signed,  Charmon Thorson T. Seleny Allbright, MD   Patient's Medications  New Prescriptions   No medications on file  Previous Medications   CALCIUM CARBONATE-VITAMIN D (CALCIUM + D) 600-200 MG-UNIT TABS    Take 2 tablets by mouth daily.     FLUTICASONE (FLONASE) 50 MCG/ACT NASAL SPRAY    USE TWO SPRAYS IN EACH NOSTRIL DAILY   GLUCOSAMINE-CHONDROITIN 500-400 MG TABLET    Take 1 tablet by mouth daily.     LEVOTHYROXINE (SYNTHROID, LEVOTHROID) 88 MCG TABLET    TAKE 1 TABLET BY MOUTH DAILY BEFORE BREAKFAST  Modified Medications   No medications on file  Discontinued Medications   LEVOFLOXACIN (LEVAQUIN) 500 MG TABLET    Take 1 tablet (500 mg total) by mouth daily.   PREDNISONE (DELTASONE) 20 MG TABLET    Take 1 tablet (20 mg total) by mouth daily with breakfast.

## 2015-02-13 NOTE — Patient Instructions (Signed)

## 2015-02-15 ENCOUNTER — Ambulatory Visit (INDEPENDENT_AMBULATORY_CARE_PROVIDER_SITE_OTHER)
Admission: RE | Admit: 2015-02-15 | Discharge: 2015-02-15 | Disposition: A | Discharge status: Home / Self Care | Payer: Medicare Other | Source: Ambulatory Visit | Attending: Family Medicine | Admitting: Family Medicine

## 2015-02-15 DIAGNOSIS — J3489 Other specified disorders of nose and nasal sinuses: Secondary | ICD-10-CM | POA: Diagnosis not present

## 2015-02-17 ENCOUNTER — Other Ambulatory Visit: Payer: Self-pay | Admitting: Family Medicine

## 2015-02-17 DIAGNOSIS — R519 Headache, unspecified: Secondary | ICD-10-CM

## 2015-02-17 DIAGNOSIS — R51 Headache: Principal | ICD-10-CM

## 2015-02-19 ENCOUNTER — Encounter: Payer: Self-pay | Admitting: *Deleted

## 2015-02-19 ENCOUNTER — Encounter: Payer: Self-pay | Admitting: Family Medicine

## 2015-02-19 LAB — HM DEXA SCAN

## 2015-02-21 ENCOUNTER — Ambulatory Visit (INDEPENDENT_AMBULATORY_CARE_PROVIDER_SITE_OTHER): Payer: Medicare Other | Admitting: Neurology

## 2015-02-21 ENCOUNTER — Encounter: Payer: Self-pay | Admitting: Neurology

## 2015-02-21 VITALS — BP 122/68 | HR 70 | Resp 18 | Ht 64.0 in | Wt 159.2 lb

## 2015-02-21 DIAGNOSIS — R51 Headache: Secondary | ICD-10-CM

## 2015-02-21 DIAGNOSIS — R519 Headache, unspecified: Secondary | ICD-10-CM

## 2015-02-21 NOTE — Progress Notes (Addendum)
NEUROLOGY CONSULTATION NOTE  Monica Irwin MRN: [id number] DOB: 08-18-1945  Referring provider: Dr. Lorelei Pont Primary care provider: Dr. Lorelei Pont  Reason for consult:  New-onset headache  HISTORY OF PRESENT ILLNESS: Monica Irwin is a 69 year old right-handed woman with hypothyroidism, hyperlipidemia, osteoporosis, insomnia and history of nephrolithiasis who presents for new-onset headache.  CT maxillary images reviewed.  History obtained by patient and PCP notes.  In December 2015, she had cataract surgery.  The morning after surgery in her right eye, she developed a shooting pain from above her right eye to the back of her head.  Since that time, she has had an episodic throbbing pain involving the peri-orbital and maxillary region.  There is no neck pain.  It is about a 5/10 intensity.  It lasts 2 hours and occurs approximately twice a day.  It is not triggered by eye movement.  There is associated mild blurred vision in the right eye, but no nausea, photophobia, phonophobia, numbness or autonomic symptoms.  She has tried Aleve D and tylenol in the past, which were ineffective.  Repeat opthalmologic exam was unremarkable.  She had a CT of the maxillofacial sinuses on 02/15/15 which were unremarkable.  She has no history of headaches.  PAST MEDICAL HISTORY: Past Medical History  Diagnosis Date  . Hypothyroidism   . Nephrolithiasis   . Osteoarthritis   . History of colonic polyps   . Osteoporosis   . Headache     PAST SURGICAL HISTORY: Past Surgical History  Procedure Laterality Date  . Back surgery      [phone]  . Partial colectomy  2005  . Thyroidectomy  2008  . Small intestine surgery      MEDICATIONS: Current Outpatient Prescriptions on File Prior to Visit  Medication Sig Dispense Refill  . Calcium Carbonate-Vitamin D (CALCIUM + D) 600-200 MG-UNIT TABS Take 2 tablets by mouth daily.      . fluticasone (FLONASE) 50 MCG/ACT nasal spray USE TWO SPRAYS IN EACH  NOSTRIL DAILY 16 g 2  . glucosamine-chondroitin 500-400 MG tablet Take 1 tablet by mouth daily.      [name] levothyroxine (SYNTHROID, LEVOTHROID) 88 MCG tablet TAKE 1 TABLET BY MOUTH DAILY BEFORE BREAKFAST 30 tablet 2   No current facility-administered medications on file prior to visit.    ALLERGIES: Allergies  Allergen Reactions  . Aspirin Nausea Only and Other (See Comments)    nausea and acid reflux    FAMILY HISTORY: Family History  Problem Relation Age of Onset  . Stroke Mother   . Alzheimer's disease Mother   . Heart failure Brother     SOCIAL HISTORY: Social History   Social History  . Marital Status: Single    Spouse Name: N/A  . Number of Children: 1  . Years of Education: N/A   Occupational History  . retired [employer] History Main Topics  . Smoking status: Never Smoker   . Smokeless tobacco: Never Used  . Alcohol Use: 0.0 oz/week    0 Standard drinks or equivalent per week     Comment: rarely  . Drug Use: No  . Sexual Activity: No   Other Topics Concern  . Not on file   Social History Narrative   Regular exercise--no             REVIEW OF SYSTEMS: Constitutional: No fevers, chills, or sweats, no generalized fatigue, change in appetite Eyes: No visual changes, double vision, eye pain Ear,  nose and throat: No hearing loss, ear pain, nasal congestion, sore throat Cardiovascular: No chest pain, palpitations Respiratory:  No shortness of breath at rest or with exertion, wheezes GastrointestinaI: No nausea, vomiting, diarrhea, abdominal pain, fecal incontinence Genitourinary:  No dysuria, urinary retention or frequency Musculoskeletal:  No neck pain, back pain Integumentary: No rash, pruritus, skin lesions Neurological: as above Psychiatric: No depression, insomnia, anxiety Endocrine: No palpitations, fatigue, diaphoresis, mood swings, change in appetite, change in weight, increased thirst Hematologic/Lymphatic:   No anemia, purpura, petechiae. Allergic/Immunologic: no itchy/runny eyes, nasal congestion, recent allergic reactions, rashes  PHYSICAL EXAM: Filed Vitals:   02/21/15 0811  BP: 122/68  Pulse: 70  Resp: 18   General: No acute distress.  Patient appears well-groomed.  Head:  Normocephalic/atraumatic Eyes:  fundi unremarkable, without vessel changes, exudates, hemorrhages or papilledema. Neck: supple, no paraspinal tenderness, full range of motion Back: No paraspinal tenderness Heart: regular rate and rhythm Lungs: Clear to auscultation bilaterally. Vascular: No carotid bruits. Neurological Exam: Mental status: alert and oriented to person, place, and time, recent and remote memory intact, fund of knowledge intact, attention and concentration intact, speech fluent and not dysarthric, language intact. Cranial nerves: CN I: not tested CN II: pupils equal, round and reactive to light, visual fields intact, fundi unremarkable, without vessel changes, exudates, hemorrhages or papilledema. CN III, IV, VI:  full range of motion, no nystagmus, no ptosis CN V: facial sensation intact CN VII: upper and lower face symmetric CN VIII: hearing intact CN IX, X: gag intact, uvula midline CN XI: sternocleidomastoid and trapezius muscles intact CN XII: tongue midline Bulk & Tone: normal, no fasciculations. Motor:  5/5 throughout Sensation:  Temperature and vibration intact Deep Tendon Reflexes:  2+ throughout, toes downgoing Finger to nose testing:  intact Heel to shin:  intact Gait:  Normal station and stride.  Able to turn and walk in tandem. Romberg negative.  IMPRESSION: New-onset right sided facial/headache.   Unclear semiology  PLAN: 1.  Would get MRI of brain and orbits with and without contrast and MRA of head to look for structural or vascular lesion (such as aneurysm) in a 69 year old woman with new-onset headaches. 2.  Offered to start gabapentin.  She would like to hold off and see  what the MRI shows 3.  Follow up in 2 months.  Thank you for allowing me to take part in the care of this patient.  Metta Clines, DO  CC:  Owens Loffler, MD

## 2015-02-21 NOTE — Patient Instructions (Addendum)
MRI of brain and orbits with and without contrast and MRA of the head [facility] 02/28/15 3:45pm Will call with results and then can start gabapentin to help with the headache Follow up in 2 months.

## 2015-02-25 ENCOUNTER — Other Ambulatory Visit: Payer: Self-pay | Admitting: Family Medicine

## 2015-02-28 ENCOUNTER — Ambulatory Visit (HOSPITAL_COMMUNITY)
Admission: RE | Admit: 2015-02-28 | Discharge: 2015-02-28 | Disposition: A | Discharge status: Home / Self Care | Payer: Medicare Other | Source: Ambulatory Visit | Attending: Neurology | Admitting: Neurology

## 2015-02-28 ENCOUNTER — Ambulatory Visit (HOSPITAL_COMMUNITY): Admission: RE | Admit: 2015-02-28 | Payer: Medicare Other | Source: Ambulatory Visit

## 2015-02-28 DIAGNOSIS — M810 Age-related osteoporosis without current pathological fracture: Secondary | ICD-10-CM | POA: Diagnosis not present

## 2015-02-28 DIAGNOSIS — R519 Headache, unspecified: Secondary | ICD-10-CM

## 2015-02-28 DIAGNOSIS — R51 Headache: Secondary | ICD-10-CM | POA: Insufficient documentation

## 2015-02-28 DIAGNOSIS — E785 Hyperlipidemia, unspecified: Secondary | ICD-10-CM | POA: Diagnosis not present

## 2015-02-28 DIAGNOSIS — E039 Hypothyroidism, unspecified: Secondary | ICD-10-CM | POA: Insufficient documentation

## 2015-02-28 DIAGNOSIS — G4700 Insomnia, unspecified: Secondary | ICD-10-CM | POA: Insufficient documentation

## 2015-02-28 MED ORDER — GADOBENATE DIMEGLUMINE 529 MG/ML IV SOLN
15.0000 mL | Freq: Once | INTRAVENOUS | Status: AC | PRN
  Administered 2015-02-28: 15 mL via INTRAVENOUS

## 2015-04-17 ENCOUNTER — Encounter (HOSPITAL_COMMUNITY): Payer: Self-pay | Admitting: *Deleted

## 2015-04-17 ENCOUNTER — Emergency Department (HOSPITAL_COMMUNITY)
Admission: EM | Admit: 2015-04-17 | Discharge: 2015-04-17 | Disposition: A | Discharge status: Home / Self Care | Payer: Medicare Other | Attending: Emergency Medicine | Admitting: Emergency Medicine

## 2015-04-17 ENCOUNTER — Emergency Department (HOSPITAL_COMMUNITY): Payer: Medicare Other

## 2015-04-17 DIAGNOSIS — I471 Supraventricular tachycardia: Secondary | ICD-10-CM

## 2015-04-17 DIAGNOSIS — Z79899 Other long term (current) drug therapy: Secondary | ICD-10-CM | POA: Insufficient documentation

## 2015-04-17 DIAGNOSIS — Z86010 Personal history of colonic polyps: Secondary | ICD-10-CM | POA: Diagnosis not present

## 2015-04-17 DIAGNOSIS — Z87442 Personal history of urinary calculi: Secondary | ICD-10-CM | POA: Diagnosis not present

## 2015-04-17 DIAGNOSIS — M810 Age-related osteoporosis without current pathological fracture: Secondary | ICD-10-CM | POA: Diagnosis not present

## 2015-04-17 DIAGNOSIS — E039 Hypothyroidism, unspecified: Secondary | ICD-10-CM | POA: Diagnosis not present

## 2015-04-17 DIAGNOSIS — Z7951 Long term (current) use of inhaled steroids: Secondary | ICD-10-CM | POA: Diagnosis not present

## 2015-04-17 DIAGNOSIS — R5381 Other malaise: Secondary | ICD-10-CM | POA: Diagnosis present

## 2015-04-17 LAB — BASIC METABOLIC PANEL
Anion gap: 7 (ref 5–15)
BUN: 23 mg/dL — AB (ref 6–20)
CO2: 28 mmol/L (ref 22–32)
Calcium: 9.1 mg/dL (ref 8.9–10.3)
Chloride: 101 mmol/L (ref 101–111)
Creatinine, Ser: 0.93 mg/dL (ref 0.44–1.00)
GFR calc Af Amer: >60 mL/min (ref >60)
GLUCOSE: 117 mg/dL — AB (ref 65–99)
POTASSIUM: 4.3 mmol/L (ref 3.5–5.1)
Sodium: 136 mmol/L (ref 135–145)

## 2015-04-17 LAB — CBC WITH DIFFERENTIAL/PLATELET
Basophils Absolute: 0 10*3/uL (ref 0.0–0.1)
Basophils Relative: 0 %
EOS PCT: 0 %
Eosinophils Absolute: 0 10*3/uL (ref 0.0–0.7)
HCT: 45.6 % (ref 36.0–46.0)
Hemoglobin: 15.2 g/dL — ABNORMAL HIGH (ref 12.0–15.0)
LYMPHS ABS: 0.7 10*3/uL (ref 0.7–4.0)
LYMPHS PCT: 9 %
MCH: 31.6 pg (ref 26.0–34.0)
MCHC: 33.3 g/dL (ref 30.0–36.0)
MCV: 94.8 fL (ref 78.0–100.0)
MONO ABS: 0.6 10*3/uL (ref 0.1–1.0)
Monocytes Relative: 8 %
Neutro Abs: 6.9 10*3/uL (ref 1.7–7.7)
Neutrophils Relative %: 83 %
PLATELETS: 222 10*3/uL (ref 150–400)
RBC: 4.81 MIL/uL (ref 3.87–5.11)
RDW: 12.6 % (ref 11.5–15.5)
WBC: 8.3 10*3/uL (ref 4.0–10.5)

## 2015-04-17 LAB — I-STAT TROPONIN, ED: Troponin i, poc: 0.08 ng/mL (ref 0.00–0.08)

## 2015-04-17 MED ORDER — ASPIRIN 325 MG PO TABS
325.0000 mg | ORAL_TABLET | Freq: Once | ORAL | Status: AC
  Administered 2015-04-17: 325 mg via ORAL
  Filled 2015-04-17: qty 1

## 2015-04-17 MED ORDER — METOPROLOL TARTRATE 25 MG PO TABS: 25.0000 mg | ORAL_TABLET | Freq: Two times a day (BID) | ORAL | Status: DC

## 2015-04-17 MED ORDER — ONDANSETRON HCL 4 MG/2ML IJ SOLN
4.0000 mg | Freq: Once | INTRAMUSCULAR | Status: AC
  Administered 2015-04-17: 4 mg via INTRAVENOUS
  Filled 2015-04-17: qty 2

## 2015-04-17 NOTE — ED Notes (Signed)
Pt arrives from home via [facility]. Pt states she had a "rapid heart beat' for 3 hours. Pt states she felt like she was going to have an episode of vomiting and diarrhea. Pt was clammy upon EMS arrival. Pt received 6 of adenosine with no effect then receive 12 of adenosine which converted her to normal rate. Pt is in afib now, with no prior hx.

## 2015-04-17 NOTE — ED Provider Notes (Signed)
CSN: [id number]     Arrival date & time 04/17/15  1157 History   First MD Initiated Contact with Patient 04/17/15 1201     Chief Complaint  Patient presents with  . Tachycardia     (Consider location/radiation/quality/duration/timing/severity/associated sxs/prior Treatment) Patient is a 69 y.o. female presenting with general illness.  Illness Location:  Generalized, chest Quality:  Malaise, diaphoresis,  Severity:  Moderate Onset quality:  Gradual Duration:  3 hours Timing:  Constant Progression:  Unchanged Chronicity:  Recurrent Context:  Prior episodes of racing heart, recurred today while riding in golf cart Relieved by:  Adenosine by EMS Worsened by:  Nothing Associated symptoms: fatigue   Associated symptoms: no abdominal pain, no diarrhea, no nausea and no shortness of breath     Past Medical History  Diagnosis Date  . Hypothyroidism   . Nephrolithiasis   . Osteoarthritis   . History of colonic polyps   . Osteoporosis   . Headache    Past Surgical History  Procedure Laterality Date  . Back surgery      [phone]  . Partial colectomy  2005  . Thyroidectomy  2008  . Small intestine surgery     Family History  Problem Relation Age of Onset  . Stroke Mother   . Alzheimer's disease Mother   . Heart failure Brother    Social History  Substance Use Topics  . Smoking status: Never Smoker   . Smokeless tobacco: Never Used  . Alcohol Use: 0.0 oz/week    0 Standard drinks or equivalent per week     Comment: rarely   OB History    No data available     Review of Systems  Constitutional: Positive for fatigue.  Respiratory: Negative for shortness of breath.   Gastrointestinal: Negative for nausea, abdominal pain and diarrhea.  All other systems reviewed and are negative.     Allergies  Aspirin  Home Medications   Prior to Admission medications   Medication Sig Start Date End Date Taking? Authorizing Provider  Calcium Carbonate-Vitamin D  (CALCIUM + D) 600-200 MG-UNIT TABS Take 2 tablets by mouth daily.      Historical Provider, MD  fluticasone (FLONASE) 50 MCG/ACT nasal spray USE TWO SPRAYS IN EACH NOSTRIL DAILY 11/28/14   Owens Loffler, MD  glucosamine-chondroitin 500-400 MG tablet Take 1 tablet by mouth daily.      Historical Provider, MD  levothyroxine (SYNTHROID, LEVOTHROID) 88 MCG tablet TAKE 1 TABLET BY MOUTH DAILY BEFORE BREAKFAST 02/25/15   Owens Loffler, MD  metoprolol (LOPRESSOR) 25 MG tablet Take 1 tablet (25 mg total) by mouth 2 (two) times daily. 04/17/15   Debby Freiberg, MD   BP 121/85 mmHg  Pulse 70  Temp(Src) 97.8 F (36.6 C) (Oral)  Resp 18  Ht 5\' 4"  (1.626 m)  Wt 160 lb (72.576 kg)  BMI 27.45 kg/m2  SpO2 100% Physical Exam  Constitutional: She is oriented to person, place, and time. She appears well-developed and well-nourished.  HENT:  Head: Normocephalic and atraumatic.  Right Ear: External ear normal.  Left Ear: External ear normal.  Eyes: Conjunctivae and EOM are normal. Pupils are equal, round, and reactive to light.  Neck: Normal range of motion. Neck supple.  Cardiovascular: Normal rate, regular rhythm, normal heart sounds and intact distal pulses.   Pulmonary/Chest: Effort normal and breath sounds normal.  Abdominal: Soft. Bowel sounds are normal. There is no tenderness.  Musculoskeletal: Normal range of motion.  Neurological: She is alert and oriented to person,  place, and time.  Skin: Skin is warm and dry.  Vitals reviewed.   ED Course  Procedures (including critical care time) Labs Review Labs Reviewed  CBC WITH DIFFERENTIAL/PLATELET - Abnormal; Notable for the following:    Hemoglobin 15.2 (*)    All other components within normal limits  BASIC METABOLIC PANEL - Abnormal; Notable for the following:    Glucose, Bld 117 (*)    BUN 23 (*)    All other components within normal limits  I-STAT TROPOININ, ED    Imaging Review Dg Chest 2 View  04/17/2015   CLINICAL DATA:   Dyspnea  EXAM: CHEST  2 VIEW  COMPARISON:  08/23/2006  FINDINGS: Atherosclerotic calcification of the aortic arch. The lungs appear clear. Mild enlargement of the cardiopericardial silhouette, without edema. No pleural effusion.  IMPRESSION: 1. Mild enlargement of the cardiopericardial silhouette, without edema. 2. Atherosclerotic calcification of the aortic arch.   Electronically Signed   By: Van Clines M.D.   On: 04/17/2015 12:52   I have personally reviewed and evaluated these images and lab results as part of my medical decision-making.   EKG Interpretation   Date/Time:  Wednesday April 17 2015 12:08:55 EDT Ventricular Rate:  92 PR Interval:  144 QRS Duration: 79 QT Interval:  349 QTC Calculation: 432 R Axis:   0 Text Interpretation:  Sinus rhythm Borderline T abnormalities, anterior  leads No significant change since last tracing Confirmed by Debby Freiberg [phone]) on 04/17/2015 12:28:04 PM      MDM   Final diagnoses:  SVT (supraventricular tachycardia) ([place])    69 y.o. female with pertinent PMH of hypothyroidism (no recent change of medication) presents with SVT by EMS.  Examined rhythm strip with regular narrow complex tachycardia, resolved with adenosine.  Initially narrow complex irregularly irregular rhythm with p waves.  This resolved with time to normal sinus rhythm.  No further symptoms.  Exam benign for me.  Wu unremarkable.  DC home to fu with cardiology.    I have reviewed all laboratory and imaging studies if ordered as above  1. SVT (supraventricular tachycardia) ([facility])         Debby Freiberg, MD 04/17/15 [phone]

## 2015-04-17 NOTE — Discharge Instructions (Signed)
Paroxysmal Supraventricular Tachycardia Paroxysmal supraventricular tachycardia (PSVT) is a type of abnormal heart rhythm. It causes your heart to beat very quickly and then suddenly stop beating so quickly. A normal heart rate is 60-100 beats per minute. During an episode of PSVT, your heart rate may be 150-250 beats per minute. This can make you feel light-headed and short of breath. An episode of PSVT can be frightening. It is usually not dangerous. The heart has four chambers. All chambers need to work together for the heart to beat effectively. A normal heartbeat usually starts in the right upper chamber of the heart (atrium) when an area (sinoatrial node) puts out an electrical signal that spreads to the other chambers. People with PSVT may have abnormal electrical pathways, or they may have other areas in the upper chambers that send out electrical signals. The result is a very rapid heartbeat. When your heart beats very quickly, it does not have time to fill completely with blood. When PSVT happens often or it lasts for long periods, it can lead to heart weakness and failure. Most people with PSVT do not have any other heart disease. CAUSES Abnormal electrical activity in the heart causes PSVT. It is not known why some people get PSVT and others do not. RISK FACTORS You may be more likely to have PSVT if:  You are 20-30 years old.  You are a woman. Other factors that may increase your chances of an attack include:  Stress.  Being tired.  Smoking.  Stimulant drugs.  Alcoholic drinks.  Caffeine.  Pregnancy. SIGNS AND SYMPTOMS A mild episode of PSVT may cause no symptoms. If you do have signs and symptoms, they may include:  A pounding heart.  Feeling of skipped heartbeats (palpitations).  Weakness.  Shortness of breath.  Tightness or pain in your chest.  Light-headedness.  Anxiety.  Dizziness.  Sweating.  Nausea.  A fainting spell. DIAGNOSIS Your health care  provider may suspect PSVT if you have symptoms that come and go. The health care provider will do a physical exam. If you are having an episode during the exam, the health care provider may be able to diagnose PSVT by listening to your heart and feeling your pulse. Tests may also be done, including:  An electrical study of your heart (electrocardiogram, or ECG).  A test in which you wear a portable ECG monitor all day (Holter monitor) or for several days (event monitor).  A test that involves taking an image of your heart using sound waves (echocardiogram) to rule out other causes of a fast heart rate. TREATMENT You may not need treatment if episodes of PSVT do not happen often or if they do not cause symptoms. If PSVT episodes do cause symptoms, your health care provider may first suggest trying a self-treatment called vagus nerve stimulation. The vagus nerve extends down from the brain. It regulates certain body functions. Stimulating this nerve can slow down the heart. Your health care provider can teach you ways to do this. You may need to try a few ways to find what works best for you. Options include:  Holding your breath and pushing, as though you are having a bowel movement.  Massaging an area on one side of your neck below your jaw.  Bending forward with your head between your legs.  Bending forward with your head between your legs and coughing.  Massaging your eyeballs with your eyes closed. If vagus nerve stimulation does not work, other treatment options include:    Medicines to prevent an attack.  Being treated in the hospital with medicine or electric shock to stop an attack (cardioversion). This treatment can include:  Getting medicine through an IV line.  Having a small electric shock delivered to your heart. You will be given medicine to make you sleep through this procedure.  If you have frequent episodes with symptoms, you may need a procedure to get rid of the faulty  areas of your heart (radiofrequency ablation) and end the episodes of PSVT. In this procedure:  A long, thin tube (catheter) is passed through one of your veins into your heart.  Energy directed through the catheter eliminates the areas of your heart that are causing abnormal electric stimulation. HOME CARE INSTRUCTIONS  Take medicines only as directed by your health care provider.  Do not use caffeine in any form if caffeine triggers episodes of PSVT. Otherwise, consume caffeine in moderation. This means no more than a few cups of coffee or the equivalent each day.  Do not drink alcohol if alcohol triggers episodes of PSVT. Otherwise, limit alcohol intake to no more than 1 drink per day for nonpregnant women and 2 drinks per day for men. One drink equals 12 ounces of beer, 5 ounces of wine, or 1 ounces of hard liquor.  Do not use any tobacco products, including cigarettes, chewing tobacco, or electronic cigarettes. If you need help quitting, ask your health care provider.  Try to get at least 7 hours of sleep each night.  Find healthy ways to manage stress.  Perform vagus nerve stimulation as directed by your health care provider.  Maintain a healthy weight.  Get some exercise on most days. Ask your health care provider to suggest some good activities for you. SEEK MEDICAL CARE IF:  You are having episodes of PSVT more often, or they are lasting longer.  Vagus nerve stimulation is no longer helping.  You have new symptoms during an episode. SEEK IMMEDIATE MEDICAL CARE IF:  You have chest pain or trouble breathing.  You have an episode of PSVT that has lasted longer than 20 minutes.  You have passed out from an episode of PSVT. These symptoms may represent a serious problem that is an emergency. Do not wait to see if the symptoms will go away. Get medical help right away. Call your local emergency services (911 in the [place] Do not drive yourself to the hospital.   This  information is not intended to replace advice given to you by your health care provider. Make sure you discuss any questions you have with your health care provider.   Document Released: 06/29/2005 Document Revised: 07/20/2014 Document Reviewed: 12/07/2013 Elsevier Interactive Patient Education 2016 [employer] Inc[employer]  

## 2015-04-17 NOTE — ED Notes (Signed)
MD at bedside. 

## 2015-04-22 ENCOUNTER — Ambulatory Visit (INDEPENDENT_AMBULATORY_CARE_PROVIDER_SITE_OTHER): Payer: Medicare Other | Admitting: Family Medicine

## 2015-04-22 ENCOUNTER — Encounter: Payer: Self-pay | Admitting: Family Medicine

## 2015-04-22 VITALS — BP 110/60 | HR 56 | Temp 97.4°F | Ht 64.0 in | Wt 158.8 lb

## 2015-04-22 DIAGNOSIS — Z23 Encounter for immunization: Secondary | ICD-10-CM | POA: Diagnosis not present

## 2015-04-22 DIAGNOSIS — I471 Supraventricular tachycardia: Secondary | ICD-10-CM

## 2015-04-22 DIAGNOSIS — I700 Atherosclerosis of aorta: Secondary | ICD-10-CM | POA: Diagnosis not present

## 2015-04-22 NOTE — Progress Notes (Signed)
Dr. Frederico Hamman T. Danetra Glock, MD, [facility] [place], [address] Phone: [phone] Fax: [phone]  04/22/2015  Patient: Monica Irwin, MRN: [id number], DOB: 1945-08-02, 69 y.o.  Primary Physician:  Owens Loffler, MD  Chief Complaint: Follow-up  Subjective:   Monica Irwin is a 69 y.o. very pleasant female patient who presents with the following:  Tachycardia with SVT defined in the ER, pulse to 180 for at least 2-3 hours.  SVT, broken with adenosine at the ER. 2-3 hours before EMS got there.  Now placed on metoprolol for rate control.   Past Medical History, Surgical History, Social History, Family History, Problem List, Medications, and Allergies have been reviewed and updated if relevant.  Patient Active Problem List   Diagnosis Date Noted  . Right-sided headache 02/21/2015  . Major depressive disorder, recurrent episode, in full remission ([place]) 07/24/2010  . OSTEOPOROSIS 07/24/2010  . INSOMNIA 08/29/2009  . HYPERLIPIDEMIA 12/19/2008  . HYPOTHYROIDISM 07/02/2008  . OSTEOARTHRITIS 07/02/2008  . NEPHROLITHIASIS, HX OF 07/02/2008    Past Medical History  Diagnosis Date  . Hypothyroidism   . Nephrolithiasis   . Osteoarthritis   . History of colonic polyps   . Osteoporosis   . Headache     Past Surgical History  Procedure Laterality Date  . Back surgery      [phone]  . Partial colectomy  2005  . Thyroidectomy  2008  . Small intestine surgery      Social History   Social History  . Marital Status: Single    Spouse Name: N/A  . Number of Children: 1  . Years of Education: N/A   Occupational History  . retired [place] History Main Topics  . Smoking status: Never Smoker   . Smokeless tobacco: Never Used  . Alcohol Use: 0.0 oz/week    0 Standard drinks or equivalent per week     Comment: rarely  . Drug Use: No  . Sexual Activity: No    Other Topics Concern  . Not on file   Social History Narrative   Regular exercise--no             Family History  Problem Relation Age of Onset  . Stroke Mother   . Alzheimer's disease Mother   . Heart failure Brother     Allergies  Allergen Reactions  . Aspirin Nausea Only and Other (See Comments)    nausea and acid reflux    Medication list reviewed and updated in full in [place].   GEN: No acute illnesses, no fevers, chills. GI: No n/v/d, eating normally Pulm: No SOB Interactive and getting along well at home.  Otherwise, ROS is as per the HPI.  Objective:   BP 110/60 mmHg  Pulse 56  Temp(Src) 97.4 F (36.3 C) (Oral)  Ht 5\' 4"  (1.626 m)  Wt 158 lb 12 oz (72.009 kg)  BMI 27.24 kg/m2  GEN: WDWN, NAD, Non-toxic, A & O x 3 HEENT: Atraumatic, Normocephalic. Neck supple. No masses, No LAD. Ears and Nose: No external deformity. CV: RRR, No M/G/R. No JVD. No thrill. No extra heart sounds. PULM: CTA B, no wheezes, crackles, rhonchi. No retractions. No resp. distress. No accessory muscle use. EXTR: No c/c/e NEURO Normal gait.  PSYCH: Normally interactive. Conversant. Not depressed or anxious appearing.  Calm demeanor.   Laboratory and Imaging Data: Results for orders placed or performed during the  hospital encounter of 04/17/15  CBC with Differential  Result Value Ref Range   WBC 8.3 4.0 - 10.5 K/uL   RBC 4.81 3.87 - 5.11 MIL/uL   Hemoglobin 15.2 (H) 12.0 - 15.0 g/dL   HCT 45.6 36.0 - 46.0 %   MCV 94.8 78.0 - 100.0 fL   MCH 31.6 26.0 - 34.0 pg   MCHC 33.3 30.0 - 36.0 g/dL   RDW 12.6 11.5 - 15.5 %   Platelets 222 150 - 400 K/uL   Neutrophils Relative % 83 %   Neutro Abs 6.9 1.7 - 7.7 K/uL   Lymphocytes Relative 9 %   Lymphs Abs 0.7 0.7 - 4.0 K/uL   Monocytes Relative 8 %   Monocytes Absolute 0.6 0.1 - 1.0 K/uL   Eosinophils Relative 0 %   Eosinophils Absolute 0.0 0.0 - 0.7 K/uL   Basophils Relative 0 %   Basophils Absolute 0.0 0.0 - 0.1  K/uL  Basic metabolic panel  Result Value Ref Range   Sodium 136 135 - 145 mmol/L   Potassium 4.3 3.5 - 5.1 mmol/L   Chloride 101 101 - 111 mmol/L   CO2 28 22 - 32 mmol/L   Glucose, Bld 117 (H) 65 - 99 mg/dL   BUN 23 (H) 6 - 20 mg/dL   Creatinine, Ser 0.93 0.44 - 1.00 mg/dL   Calcium 9.1 8.9 - 10.3 mg/dL   GFR calc non Af Amer >60 >60 mL/min   GFR calc Af Amer >60 >60 mL/min   Anion gap 7 5 - 15  I-stat troponin, ED  Result Value Ref Range   Troponin i, poc 0.08 0.00 - 0.08 ng/mL   Comment 3            Dg Chest 2 View  04/17/2015   CLINICAL DATA:  Dyspnea  EXAM: CHEST  2 VIEW  COMPARISON:  08/23/2006  FINDINGS: Atherosclerotic calcification of the aortic arch. The lungs appear clear. Mild enlargement of the cardiopericardial silhouette, without edema. No pleural effusion.  IMPRESSION: 1. Mild enlargement of the cardiopericardial silhouette, without edema. 2. Atherosclerotic calcification of the aortic arch.   Electronically Signed   By: Van Clines M.D.   On: 04/17/2015 12:52     Assessment and Plan:   SVT (supraventricular tachycardia) ([place]) - Plan: Ambulatory referral to Cardiology  Need for prophylactic vaccination and inoculation against influenza - Plan: Flu Vaccine QUAD 36+ mos IM  SVT to 180 requiring adenosine Enlargement of cardiac silhouette on XR Calcification of the aortic arch  Follow-up: No Follow-up on file.  New Prescriptions   No medications on file   Orders Placed This Encounter  Procedures  . Flu Vaccine QUAD 36+ mos IM  . Ambulatory referral to Cardiology    Signed,  Frederico Hamman T. Adalis Gatti, MD   Patient's Medications  New Prescriptions   No medications on file  Previous Medications   CALCIUM CARBONATE-VITAMIN D (CALCIUM + D) 600-200 MG-UNIT TABS    Take 2 tablets by mouth daily.     FLUTICASONE (FLONASE) 50 MCG/ACT NASAL SPRAY    USE TWO SPRAYS IN EACH NOSTRIL DAILY   GLUCOSAMINE-CHONDROITIN 500-400 MG TABLET    Take 1 tablet by mouth  daily.     LEVOTHYROXINE (SYNTHROID, LEVOTHROID) 88 MCG TABLET    TAKE 1 TABLET BY MOUTH DAILY BEFORE BREAKFAST   METOPROLOL (LOPRESSOR) 25 MG TABLET    Take 1 tablet (25 mg total) by mouth 2 (two) times daily.  Modified Medications   No  medications on file  Discontinued Medications   No medications on file

## 2015-04-22 NOTE — Patient Instructions (Signed)

## 2015-04-22 NOTE — Progress Notes (Signed)
Pre visit review using our clinic review tool, if applicable. No additional management support is needed unless otherwise documented below in the visit note. 

## 2015-04-30 ENCOUNTER — Ambulatory Visit: Payer: Medicare Other | Admitting: Neurology

## 2015-05-07 ENCOUNTER — Encounter: Payer: Self-pay | Admitting: Cardiovascular Disease

## 2015-05-07 ENCOUNTER — Ambulatory Visit (INDEPENDENT_AMBULATORY_CARE_PROVIDER_SITE_OTHER): Payer: Medicare Other | Admitting: Cardiovascular Disease

## 2015-05-07 VITALS — BP 132/64 | HR 62 | Ht 64.0 in | Wt 157.5 lb

## 2015-05-07 DIAGNOSIS — E785 Hyperlipidemia, unspecified: Secondary | ICD-10-CM

## 2015-05-07 DIAGNOSIS — I471 Supraventricular tachycardia: Secondary | ICD-10-CM | POA: Diagnosis not present

## 2015-05-07 DIAGNOSIS — I517 Cardiomegaly: Secondary | ICD-10-CM | POA: Diagnosis not present

## 2015-05-07 DIAGNOSIS — Z13220 Encounter for screening for lipoid disorders: Secondary | ICD-10-CM | POA: Diagnosis not present

## 2015-05-07 HISTORY — DX: Supraventricular tachycardia: I47.1

## 2015-05-07 NOTE — Patient Instructions (Addendum)
Medication Instructions:  Dr Oval Linsey has made no changes in your current medications. Please continue your medications as listed.  Labwork: Your physician recommends that you return for lab work 3 months - FASTING.  Testing/Procedures: Your physician has requested that you have an echocardiogram. Echocardiography is a painless test that uses sound waves to create images of your heart. It provides your doctor with information about the size and shape of your heart and how well your heart's chambers and valves are working. This procedure takes approximately one hour. There are no restrictions for this procedure.  Follow-Up: Dr Oval Linsey recommends that you schedule a follow-up appointment in 1 year. You will receive a reminder letter in the mail two months in advance. If you don't receive a letter, please call our office to schedule the follow-up appointment.  If you need a refill on your cardiac medications before your next appointment, please call your pharmacy.  **Dr Oval Linsey has referred you to Dr Virl Axe for consideration of ablation.  Cardiac Ablation Cardiac ablation is a procedure to disable a small amount of heart tissue in very specific places. The heart has many electrical connections. Sometimes these connections are abnormal and can cause the heart to beat very fast or irregularly. By disabling some of the problem areas, heart rhythm can be improved or made normal. Ablation is done for people who:   Have Wolff-Parkinson-White syndrome.   Have other fast heart rhythms (tachycardia).   Have taken medicines for an abnormal heart rhythm (arrhythmia) that resulted in:   No success.   Side effects.   May have a high-risk heartbeat that could result in death.  LET [facility] CARE PROVIDER KNOW ABOUT:   Any allergies you have or any previous reactions you have had to X-ray dye, food (such as seafood), medicine, or tape.   All medicines you are taking, including  vitamins, herbs, eye drops, creams, and over-the-counter medicines.   Previous problems you or members of your family have had with the use of anesthetics.   Any blood disorders you have.   Previous surgeries or procedures (such as a kidney transplant) you have had.   Medical conditions you have (such as kidney failure).  RISKS AND COMPLICATIONS Generally, cardiac ablation is a safe procedure. However, problems can occur and include:   Increased risk of cancer. Depending on how long it takes to do the ablation, the dose of radiation can be high.  Bruising and bleeding where a thin, flexible tube (catheter) was inserted during the procedure.   Bleeding into the chest, especially into the sac that surrounds the heart (serious).  Need for a permanent pacemaker if the normal electrical system is damaged.   The procedure may not be fully effective, and this may not be recognized for months. Repeat ablation procedures are sometimes required. BEFORE THE PROCEDURE   Follow any instructions from your health care provider regarding eating and drinking before the procedure.   Take your medicines as directed at regular times with water, unless instructed otherwise by your health care provider. If you are taking diabetes medicine, including insulin, ask how you are to take it and if there are any special instructions you should follow. It is common to adjust insulin dosing the day of the ablation.  PROCEDURE  An ablation is usually performed in a catheterization laboratory with the guidance of fluoroscopy. Fluoroscopy is a type of X-ray that helps your health care provider see images of your heart during the procedure.   An  ablation is a minimally invasive procedure. This means a small cut (incision) is made in either your neck or groin. Your health care provider will decide where to make the incision based on your medical history and physical exam.  An IV tube will be started before  the procedure begins. You will be given an anesthetic or medicine to help you relax (sedative).  The skin on your neck or groin will be numbed. A needle will be inserted into a large vein in your neck or groin and catheters will be threaded to your heart.  A special dye that shows up on fluoroscopy pictures may be injected through the catheter. The dye helps your health care provider see the area of the heart that needs treatment.  The catheter has electrodes on the tip. When the area of heart tissue that is causing the arrhythmia is found, the catheter tip will send an electrical current to the area and "scar" the tissue. Three types of energy can be used to ablate the heart tissue:   Heat (radiofrequency energy).   Laser energy.   Extreme cold (cryoablation).   When the area of the heart has been ablated, the catheter will be taken out. Pressure will be held on the insertion site. This will help the insertion site clot and keep it from bleeding. A bandage will be placed on the insertion site.  AFTER THE PROCEDURE   After the procedure, you will be taken to a recovery area where your vital signs (blood pressure, heart rate, and breathing) will be monitored. The insertion site will also be monitored for bleeding.   You will need to lie still for 4-6 hours. This is to ensure you do not bleed from the catheter insertion site.    This information is not intended to replace advice given to you by your health care provider. Make sure you discuss any questions you have with your health care provider.   Document Released: 11/15/2008 Document Revised: 07/20/2014 Document Reviewed: 11/21/2012 Elsevier Interactive Patient Education [employer].

## 2015-05-07 NOTE — Progress Notes (Signed)
Cardiology Office Note   Date:  05/07/2015   ID:  Monica Irwin, DOB 1946/05/22, MRN [id number]  PCP:  Owens Loffler, MD  Cardiologist:   Sharol Harness, MD   Chief Complaint  Patient presents with  . Tachycardia    elevated PR       History of Present Illness: Monica Irwin is a 69 y.o. female with hyperlipidemia, hypothyroidism and aortic calcifications who presents for follow up on SVT.  Monica Irwin was seen in the ED on 10/5 for SVT.  She was brought in by EMS after being at home for 3 hours with a heart rate of 180.  She denies any chest pain or shortness of breath during the episode.  When she was unable to get it to resolve she called EMS.  She received 6 mg followed by 12 mg of adenosine with conversion to sinus rhythm.  She was noted have sinus arrhythmia and sinus rhythm post-conversion.  She was started on metoprolol which stopped about one week ago.  In the ED she was incidentally noted to have mild cardiomegaly on chest x-ray.  Since she was seen in the emergency department she's not had any further episodes of heart racing. However she does note that this has happened to her several times since she was in her 27s. Typically when it happens it lasts for about 20 minutes and she is able to get it to stop by laying down and taking deep breaths. It's never lasted as long as it did in early October. She does not like taking medications and noticed that while she was on metoprolol she felt sluggish and fatigued.  She denies lower extremity edema, orthopnea, PND, palpitations, lightheadedness or dizziness.    Monica Irwin brother had a heart attack at age 44 and died. She has another living brother who has significant coronary artery disease as well.   She exercises by working in her yard daily and walks for 20-30 minutes most mornings. She has no chest pain or shortness of breath with these activities. She does not smoke and drinks alcohol very  occasionally.    Past Medical History  Diagnosis Date  . Hypothyroidism   . Nephrolithiasis   . Osteoarthritis   . History of colonic polyps   . Osteoporosis   . Headache   . SVT (supraventricular tachycardia) ([place]) 05/07/2015    Past Surgical History  Procedure Laterality Date  . Back surgery      [phone]  . Partial colectomy  2005  . Thyroidectomy  2008  . Small intestine surgery       Current Outpatient Prescriptions  Medication Sig Dispense Refill  . Calcium Carbonate-Vitamin D (CALCIUM + D) 600-200 MG-UNIT TABS Take 2 tablets by mouth daily.      . fluticasone (FLONASE) 50 MCG/ACT nasal spray Place 2 sprays into both nostrils daily as needed for allergies or rhinitis (allergies).    [name] glucosamine-chondroitin 500-400 MG tablet Take 1 tablet by mouth daily.      [name] levothyroxine (SYNTHROID, LEVOTHROID) 88 MCG tablet TAKE 1 TABLET BY MOUTH DAILY BEFORE BREAKFAST 30 tablet 10   No current facility-administered medications for this visit.    Allergies:   Aspirin    Social History:  The patient  reports that she has never smoked. She has never used smokeless tobacco. She reports that she drinks alcohol. She reports that she does not use illicit drugs.   Family History:  The patient's family history includes  Alzheimer's disease in her mother and sister; Heart attack in her brother; Heart failure in her brother; Stroke in her mother.    ROS:  Please see the history of present illness.   Otherwise, review of systems are positive for none.   All other systems are reviewed and negative.    PHYSICAL EXAM: VS:  BP 132/64 mmHg  Pulse 62  Ht 5\' 4"  (1.626 m)  Wt 71.442 kg (157 lb 8 oz)  BMI 27.02 kg/m2 , BMI Body mass index is 27.02 kg/(m^2). GENERAL:  Well appearing HEENT:  Pupils equal round and reactive, fundi not visualized, oral mucosa unremarkable NECK:  No jugular venous distention, waveform within normal limits, carotid upstroke brisk and symmetric, no bruits,  no thyromegaly LYMPHATICS:  No cervical adenopathy LUNGS:  Clear to auscultation bilaterally HEART:  RRR.  PMI not displaced or sustained,S1 and S2 within normal limits, no S3, no S4, no clicks, no rubs, no murmurs.  No RV heave. ABD:  Flat, positive bowel sounds normal in frequency in pitch, no bruits, no rebound, no guarding, no midline pulsatile mass, no hepatomegaly, no splenomegaly EXT:  2 plus pulses throughout, no edema, no cyanosis no clubbing SKIN:  No rashes no nodules NEURO:  Cranial nerves II through XII grossly intact, motor grossly intact throughout PSYCH:  Cognitively intact, oriented to person place and time    EKG:  EKG is ordered today. The ekg ordered today demonstrates sinus rhythm, rate 62 bpm.  R superior axis.  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  Recent Labs: 02/08/2015: ALT 19; TSH 0.96 04/17/2015: BUN 23*; Creatinine, Ser 0.93; Hemoglobin 15.2*; Platelets 222; Potassium 4.3; Sodium 136    Lipid Panel    Component Value Date/Time   CHOL 206* 02/08/2015 0821   TRIG 82.0 02/08/2015 0821   HDL 53.50 02/08/2015 0821   CHOLHDL 4 02/08/2015 0821   VLDL 16.4 02/08/2015 0821   LDLCALC 136* 02/08/2015 0821   LDLDIRECT 147.5 03/02/2012 0912      Wt Readings from Last 3 Encounters:  05/07/15 71.442 kg (157 lb 8 oz)  04/22/15 72.009 kg (158 lb 12 oz)  04/17/15 72.576 kg (160 lb)      ASSESSMENT AND PLAN:  # SVT: [name] is had recurrent episodes of SVT since she was in her 69s. Prior to this episode she had not had one in the last 2 years, though she says it happens several times in the past. This episode was particularly bad and required administration of adenosine. She did take metoprolol for a short period of time, but does not want to take that long-term. We discussed her options which include doing nothing and treating potential events with vagal maneuvers, taking suppressive medications such as metoprolol or a calcium channel blocker, and cardiac ablation. She likes the idea  that ablation could be curative. We discussed the fact that because we don't actually have this rhythm on EKG it's a little bit more challenging to say what her success rate is. 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 However I cannot rule out AVNRT, atrial tach, or atrial flutter without seeing the EKG myself. She would like to follow up with electrophysiology to discuss her options further. She likes the idea that the patient could be curative. Thyroid function and electrolytes are normal. We will continue to hold metoprolol for now and see how she does off this medication   # Cardiomegaly: Ms. Brunell was noted to have myocardial megaly on her chest x-ray.  We will obtain an echo to evaluate for structural heart disease. 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 We will be able to assess this better on echo. She does not have any signs of heart failure on exam today.  # CV Disease prevention: Based on her lipids in July, her ASCVD 10 year risk of heart attack or stroke is 11.3%. We discussed the fact that this pain she should start an aspirin daily. Ideally she would start a statin as well. She would prefer to do lifestyle interventions and will work on her diet. We will repeat her lipids in 3 months and if she continues to be above 5% risk, we will consider statin therapy at that time.   Current medicines are reviewed at length with the patient today.  The patient does not have concerns regarding medicines.  The following changes have been made:  Start aspirin 81 mg.  Stop metoprolol.  Labs/ tests ordered today include:   Orders Placed This Encounter  Procedures  . Lipid panel  . Ambulatory referral to Cardiac Electrophysiology  . EKG 12-Lead  . ECHOCARDIOGRAM COMPLETE     Disposition:   FU with Piero Mustard C. Oval Linsey, MD in     [place], Sharol Harness, MD  05/07/2015 11:24 AM    [place]

## 2015-05-15 ENCOUNTER — Other Ambulatory Visit (HOSPITAL_COMMUNITY): Payer: Medicare Other

## 2015-05-15 ENCOUNTER — Telehealth: Payer: Self-pay | Admitting: Cardiovascular Disease

## 2015-05-15 ENCOUNTER — Other Ambulatory Visit: Payer: Self-pay

## 2015-05-15 ENCOUNTER — Ambulatory Visit (HOSPITAL_COMMUNITY): Payer: Medicare Other | Attending: Cardiology

## 2015-05-15 DIAGNOSIS — I071 Rheumatic tricuspid insufficiency: Secondary | ICD-10-CM | POA: Diagnosis not present

## 2015-05-15 DIAGNOSIS — I5189 Other ill-defined heart diseases: Secondary | ICD-10-CM | POA: Insufficient documentation

## 2015-05-15 DIAGNOSIS — I340 Nonrheumatic mitral (valve) insufficiency: Secondary | ICD-10-CM | POA: Insufficient documentation

## 2015-05-15 DIAGNOSIS — I517 Cardiomegaly: Secondary | ICD-10-CM

## 2015-05-15 NOTE — Telephone Encounter (Signed)
Patient was told someone would call her about taking a baby aspirin daily and no one has called.

## 2015-05-15 NOTE — Telephone Encounter (Signed)
Line busy when dialed. 

## 2015-05-16 ENCOUNTER — Ambulatory Visit (INDEPENDENT_AMBULATORY_CARE_PROVIDER_SITE_OTHER): Payer: Medicare Other | Admitting: Cardiology

## 2015-05-16 ENCOUNTER — Encounter: Payer: Self-pay | Admitting: Cardiology

## 2015-05-16 VITALS — BP 122/86 | HR 78 | Ht 64.0 in | Wt 157.6 lb

## 2015-05-16 DIAGNOSIS — I471 Supraventricular tachycardia: Secondary | ICD-10-CM

## 2015-05-16 NOTE — Progress Notes (Signed)
Electrophysiology Office Note   Date:  05/17/2015   ID:  Monica, Irwin [birth year], MRN [id number]  PCP:  Monica Loffler, MD  Cardiologist:  Monica Irwin Primary Electrophysiologist: Monica Haw, MD    Chief Complaint  Patient presents with  . SVT     History of Present Illness: Monica Irwin is a 69 y.o. female who presents today for electrophysiology evaluation.   She presents for evaluation of SVT.  Ms. Monica Irwin was seen in the ED on 10/5 for SVT. She was brought in by EMS after being at home for 3 hours with a heart rate of 180. She denies any chest pain or shortness of breath during the episode. When she was unable to get it to resolve she called EMS. She received 6 mg followed by 12 mg of adenosine with conversion to sinus rhythm. She was noted have sinus arrhythmia and sinus rhythm post-conversion. Since she was seen in the ER, she has had no further tachycardia.  She has felt well and has been able to do all her daily activites.  When these episodes occur she can lay down and take deep breaths and they usually go away.  She tried metoprolol but felt sluggish.     Today, she denies symptoms of palpitations, chest pain, shortness of breath, orthopnea, PND, lower extremity edema, claudication, dizziness, presyncope, syncope, bleeding, or neurologic sequela. The patient is tolerating medications without difficulties and is otherwise without complaint today.    Past Medical History  Diagnosis Date  . Hypothyroidism   . Nephrolithiasis   . Osteoarthritis   . History of colonic polyps   . Osteoporosis   . Headache   . SVT (supraventricular tachycardia) (Monica Irwin) 05/07/2015   Past Surgical History  Procedure Laterality Date  . Back surgery      [phone]  . Partial colectomy  2005  . Thyroidectomy  2008  . Small intestine surgery       Current Outpatient Prescriptions  Medication Sig Dispense Refill  . Calcium Carbonate-Vitamin D (CALCIUM  + D) 600-200 MG-UNIT TABS Take 2 tablets by mouth daily.      . fluticasone (FLONASE) 50 MCG/ACT nasal spray Place 2 sprays into both nostrils daily as needed for allergies or rhinitis (allergies).    [name] glucosamine-chondroitin 500-400 MG tablet Take 1 tablet by mouth daily.      [name] levothyroxine (SYNTHROID, LEVOTHROID) 88 MCG tablet TAKE 1 TABLET BY MOUTH DAILY BEFORE BREAKFAST 30 tablet 10   No current facility-administered medications for this visit.    Allergies:   Aspirin   Social History:  The patient  reports that she has never smoked. She has never used smokeless tobacco. She reports that she drinks alcohol. She reports that she does not use illicit drugs.   Family History:  The patient's family history includes Alzheimer's disease in her mother and sister; Heart attack in her brother; Heart failure in her brother; Stroke in her mother.    ROS:  Please see the history of present illness.   All other systems are reviewed and negative.    PHYSICAL EXAM: VS:  BP 122/86 mmHg  Pulse 78  Ht 5\' 4"  (1.626 m)  Wt 157 lb 9.6 oz (71.487 kg)  BMI 27.04 kg/m2 , BMI Body mass index is 27.04 kg/(m^2). GEN: Well nourished, well developed, in no acute distress HEENT: normal Neck: no JVD, carotid bruits, or masses Cardiac: RRR; no murmurs, rubs, or gallops,no edema  Respiratory:  clear to auscultation  bilaterally, normal work of breathing GI: soft, nontender, nondistended, + BS MS: no deformity or atrophy Skin: warm and dry Neuro:  Strength and sensation are intact Psych: euthymic mood, full affect  EKG:  EKG is ordered today. The ekg ordered today shows sinus rhythm, LAFB, rate 78   Recent Labs: 02/08/2015: ALT 19; TSH 0.96 04/17/2015: BUN 23*; Creatinine, Ser 0.93; Hemoglobin 15.2*; Platelets 222; Potassium 4.3; Sodium 136    Lipid Panel     Component Value Date/Time   CHOL 206* 02/08/2015 0821   TRIG 82.0 02/08/2015 0821   HDL 53.50 02/08/2015 0821   CHOLHDL 4 02/08/2015 0821     VLDL 16.4 02/08/2015 0821   LDLCALC 136* 02/08/2015 0821   LDLDIRECT 147.5 03/02/2012 0912     Wt Readings from Last 3 Encounters:  05/16/15 157 lb 9.6 oz (71.487 kg)  05/07/15 157 lb 8 oz (71.442 kg)  04/22/15 158 lb 12 oz (72.009 kg)      Other studies Reviewed: Additional studies/ records that were reviewed today include: TTE Review of the above records today demonstrates:  - Left ventricle: The cavity size was normal. Wall thickness was normal. Systolic function was normal. The estimated ejection fraction was in the range of 60% to 65%. Wall motion was normal; there were no regional wall motion abnormalities. Doppler parameters are consistent with abnormal left ventricular relaxation (grade 1 diastolic dysfunction). - Mitral valve: Calcified annulus. - Pulmonary arteries: Systolic pressure was mildly increased. PA peak pressure: 33 mm Hg (S).   ASSESSMENT AND PLAN:  1.  SVT: Had SVT in the ER requiring adenosine for conversoin.  She has had similar palpitaitns for years.  AVNRT and ORT are both on the differential.  She is not pre-excited.  Monica Irwin plan for ablation.  Discussed the risks and benefits with her including bleeding, infection, tamponade, and heart block.  She has agreed to the procedure.  Monica Irwin schedule as soon as possible.    Current medicines are reviewed at length with the patient today.   The patient does not have concerns regarding her medicines.  The following changes were made today:  none  Labs/ tests ordered today include: SVT ablatoin  No orders of the defined types were placed in this encounter.     Disposition:   FU with Monica Irwin post ablation  Signed, Monica Hornbeck Meredith Leeds, MD  05/17/2015 7:24 PM     [facility] [address] [place] [address] [phone] (office) [phone] (fax)

## 2015-05-16 NOTE — Patient Instructions (Addendum)
Medication Instructions:  Your physician recommends that you continue on your current medications as directed. Please refer to the Current Medication list given to you today.  Labwork: None ordered  Testing/Procedures: Your physician has recommended that you have an ablation. Catheter ablation is a medical procedure used to treat some cardiac arrhythmias (irregular heartbeats). During catheter ablation, a long, thin, flexible tube is put into a blood vessel in your groin (upper thigh), or neck. This tube is called an ablation catheter. It is then guided to your heart through the blood vessel. Radio frequency waves destroy small areas of heart tissue where abnormal heartbeats may cause an arrhythmia to start.   Sherri will call you, in the next few weeks,  to schedule this procedure for January.  Follow-Up: To be determined after we schedule procedure  Any Other Special Instructions Will Be Listed Below (If Applicable).  If you need a refill on your cardiac medications before your next appointment, please call your pharmacy.  Thank you for choosing [facility]!!   Trinidad Curet, RN [phone]

## 2015-05-17 ENCOUNTER — Telehealth: Payer: Self-pay | Admitting: *Deleted

## 2015-05-17 DIAGNOSIS — I471 Supraventricular tachycardia: Secondary | ICD-10-CM

## 2015-05-17 DIAGNOSIS — Z01812 Encounter for preprocedural laboratory examination: Secondary | ICD-10-CM

## 2015-05-17 NOTE — Telephone Encounter (Signed)
Spoke to daughter Per last office note with Dr Oval Linsey- [address] [place]. DAUGHTER STATES SHE WILL NOTIFY PATIENT DAUGHTER STATES PATIENT WILL HAVE ABLATION IN JAN 2017 WITH DR CAMNITZ

## 2015-05-17 NOTE — Telephone Encounter (Signed)
Called patient to schedule date for procedure. Scheduled SVT RFCA for 07/22/15. Pt aware I will call her next week to review procedure/instructions.

## 2015-05-20 NOTE — Addendum Note (Signed)
Addended by: Freada Bergeron on: 05/20/2015 05:11 PM   Modules accepted: Orders

## 2015-05-23 ENCOUNTER — Encounter: Payer: Self-pay | Admitting: *Deleted

## 2015-05-23 ENCOUNTER — Telehealth: Payer: Self-pay | Admitting: *Deleted

## 2015-05-23 NOTE — Addendum Note (Signed)
Addended by: Stanton Kidney on: 05/23/2015 08:44 AM   Modules accepted: Orders

## 2015-05-23 NOTE — Telephone Encounter (Signed)
Spoke to patient. Result given . Verbalized understanding BLOOD PRESSURE SHOULD BE 140/90 THE MAJORITY OF THE TIME Patient states she will monitor and contact office with readings.

## 2015-05-23 NOTE — Telephone Encounter (Signed)
Follow up     Patient calling back nurse called from yesterday

## 2015-05-23 NOTE — Telephone Encounter (Signed)
SVT RFCA scheduled for 07/22/15. Pre procedure labs 07/16/15. Letter of instructions reviewed with patient and mailed to home address. Advised to call back with any questions/concerns. Patient verbalized understanding and agreeable to plan.   Will have Melissa call patient to arrange 4 week f/u w/ Amber/Renee and 3 month f/u w/ Camnitz.

## 2015-05-23 NOTE — Telephone Encounter (Signed)
-----   Message from Skeet Latch, MD sent at 05/21/2015  9:34 PM EST ----- Echo shows that the her heart does not relax completely.  It is slightly stiff.  It will be important to keep her blood pressure under good control to prevent this from getting worse.

## 2015-06-12 ENCOUNTER — Ambulatory Visit (INDEPENDENT_AMBULATORY_CARE_PROVIDER_SITE_OTHER): Payer: Medicare Other

## 2015-06-12 VITALS — BP 130/76 | HR 81 | Ht 64.0 in | Wt 158.1 lb

## 2015-06-12 DIAGNOSIS — I471 Supraventricular tachycardia: Secondary | ICD-10-CM

## 2015-06-12 NOTE — Progress Notes (Signed)
Cardiology Office Note   Date:  06/13/2015   ID:  Monica Irwin, DOB [birth year], MRN [id number]  PCP:  Owens Loffler, MD  Cardiologist:   Sharol Harness, MD   Chief Complaint  Patient presents with  . Follow-up    weakness--pt states she feels her heart racing, has been happening for a long time//started feeling weak last week, had seven or eight episodes in one day.//pt states she has had no other Sx.//would like to know about taking aspirin 81 mg, pt states she tried to call and ask about it but has not heard back.     Patient ID: Monica Irwin is a 68 y.o. female with hyperlipidemia, hypothyroidism and aortic calcifications who presents for follow up on SVT.   Interval History 06/13/15: Monica Irwin was referred to Dr. Curt Bears for her SVT.  She is currently awaiting ablation next month.  She had an echo that revealed normal systolic function and grade one diastolic dysfunction.  Yesterday she walked in to clinic with a complaint of weak spells.  She was evaluated by the nursing staff and had no arrhythmias on ECG.  She was reassured and scheduled for follow up today.  She reports "weak spells" that have been occurring frequently.  Last week she had several episodes that lasted 10-15 seconds and recurred up to 10 times per day. This week they last for the same duration but have recurred 3-4 times per day. There is no associated shortness of breath and she doesn't feel as though her heart is racing, though it feels like it is about to start racing again. After the episode is over she feels better after she takes a deep breath then exhales. She does not feel particularly anxious and denies any chest pain or presyncope. She's feeling generally fatigued but otherwise well.  The symptoms occur more commonly when she is working out in the yard or walking.They never occur at night or at rest.  Monica Irwin has been working hard to lower her cholesterol. She changed her diet and  increased her exercise. She has lost 5 pounds since I last saw her.  History of Present Illness 05/07/15: Monica Irwin was seen in the ED on 10/5 for SVT.  She was brought in by EMS after being at home for 3 hours with a heart rate of 180.  She denies any chest pain or shortness of breath during the episode.  When she was unable to get it to resolve she called EMS.  She received 6 mg followed by 12 mg of adenosine with conversion to sinus rhythm.  She was noted have sinus arrhythmia and sinus rhythm post-conversion.  She was started on metoprolol which stopped about one week ago.  In the ED she was incidentally noted to have mild cardiomegaly on chest x-ray.  Since she was seen in the emergency department she's not had any further episodes of heart racing. However she does note that this has happened to her several times since she was in her 65s. Typically when it happens it lasts for about 20 minutes and she is able to get it to stop by laying down and taking deep breaths. It's never lasted as long as it did in early October. She does not like taking medications and noticed that while she was on metoprolol she felt sluggish and fatigued.  She denies lower extremity edema, orthopnea, PND, palpitations, lightheadedness or dizziness.   Monica Irwin brother had a heart attack at age  69 and died. She has another living brother who has significant coronary artery disease as well.   She exercises by working in her yard daily and walks for 20-30 minutes most mornings. She has no chest pain or shortness of breath with these activities. She does not smoke and drinks alcohol very occasionally.   Past Medical History  Diagnosis Date  . Hypothyroidism   . Nephrolithiasis   . Osteoarthritis   . History of colonic polyps   . Osteoporosis   . Headache   . SVT (supraventricular tachycardia) ([place]) 05/07/2015    Past Surgical History  Procedure Laterality Date  . Back surgery      [phone]  . Partial  colectomy  2005  . Thyroidectomy  2008  . Small intestine surgery       Current Outpatient Prescriptions  Medication Sig Dispense Refill  . aspirin 81 MG tablet Take 81 mg by mouth daily.    . Calcium Carbonate-Vitamin D (CALCIUM + D) 600-200 MG-UNIT TABS Take 2 tablets by mouth daily.      . fluticasone (FLONASE) 50 MCG/ACT nasal spray Place 2 sprays into both nostrils daily as needed for allergies or rhinitis (allergies).    [name] glucosamine-chondroitin 500-400 MG tablet Take 1 tablet by mouth daily.      [name] levothyroxine (SYNTHROID, LEVOTHROID) 88 MCG tablet TAKE 1 TABLET BY MOUTH DAILY BEFORE BREAKFAST 30 tablet 10   No current facility-administered medications for this visit.    Allergies:   Aspirin    Social History:  The patient  reports that she has never smoked. She has never used smokeless tobacco. She reports that she drinks alcohol. She reports that she does not use illicit drugs.   Family History:  The patient's family history includes Alzheimer's disease in her mother and sister; Heart attack in her brother; Heart failure in her brother; Stroke in her mother.    ROS:  Please see the history of present illness.   Otherwise, review of systems are positive for none.   All other systems are reviewed and negative.    PHYSICAL EXAM: VS:  BP 130/72 mmHg  Pulse 72  Ht 5\' 4"  (1.626 m)  Wt 70.398 kg (155 lb 3.2 oz)  BMI 26.63 kg/m2 , BMI Body mass index is 26.63 kg/(m^2). GENERAL:  Well appearing HEENT:  Pupils equal round and reactive, fundi not visualized, oral mucosa unremarkable NECK:  No jugular venous distention, waveform within normal limits, carotid upstroke brisk and symmetric, no bruits, no thyromegaly LYMPHATICS:  No cervical adenopathy LUNGS:  Clear to auscultation bilaterally HEART:  RRR.  PMI not displaced or sustained,S1 and S2 within normal limits, no S3, no S4, no clicks, no rubs, no murmurs.  No RV heave. ABD:  Flat, positive bowel sounds normal in frequency  in pitch, no bruits, no rebound, no guarding, no midline pulsatile mass, no hepatomegaly, no splenomegaly EXT:  2 plus pulses throughout, no edema, no cyanosis no clubbing SKIN:  No rashes no nodules NEURO:  Cranial nerves II through XII grossly intact, motor grossly intact throughout PSYCH:  Cognitively intact, oriented to person place and time   EKG:  EKG is not ordered today. The ekg ordered 11/30 demonstrated sinus rhythm rate 81 bpm. RS are prime in V1.Left axis deviation  Echo 05/15/15: Study Conclusions  - Left ventricle: The cavity size was normal. Wall thickness was normal. Systolic function was normal. The estimated ejection fraction was in the range of 60% to 65%. Wall motion was normal;  there were no regional wall motion abnormalities. Doppler parameters are consistent with abnormal left ventricular relaxation (grade 1 diastolic dysfunction). - Mitral valve: Calcified annulus. - Pulmonary arteries: Systolic pressure was mildly increased. PA peak pressure: 33 mm Hg (S).  Recent Labs: 02/08/2015: ALT 19; TSH 0.96 04/17/2015: BUN 23*; Creatinine, Ser 0.93; Hemoglobin 15.2*; Platelets 222; Potassium 4.3; Sodium 136    Lipid Panel    Component Value Date/Time   CHOL 206* 02/08/2015 0821   TRIG 82.0 02/08/2015 0821   HDL 53.50 02/08/2015 0821   CHOLHDL 4 02/08/2015 0821   VLDL 16.4 02/08/2015 0821   LDLCALC 136* 02/08/2015 0821   LDLDIRECT 147.5 03/02/2012 0912      Wt Readings from Last 3 Encounters:  06/13/15 70.398 kg (155 lb 3.2 oz)  06/12/15 71.697 kg (158 lb 1 oz)  05/16/15 71.487 kg (157 lb 9.6 oz)      ASSESSMENT AND PLAN:   # Exertional fatigue: Ms. Dorris's exertional symptoms are concerning for ischemia.  We will refer her for exercise Cardiolite.  If her stress is negative, we will start a beta blocker, as her symptoms may be due to her SVT.  She was noted to have grade 1 diastolic dysfunction on echo, which is unlikely to be the cause of  her symptoms.  Start aspirin 81 mg daily.  This can be stopped if stress is negative and ASCVD 10 year risk is <5%.  # SVT: Ms. Rehagen has been evaluated by Dr. Curt Bears and will undergo ablation next month.  # Hyperlipidemia: Ms. Tam has been modifiying her diet.  Based on her lipids in July, her ASCVD 10 year risk of heart attack or stroke is 11.3%.  We will repeat her lipid panel today.  If she remains above 5% risk we will start a statin.  She will start an aspirin today due to concern for her exertional symptoms.   Current medicines are reviewed at length with the patient today.  The patient does not have concerns regarding medicines.  The following changes have been made:  Start aspirin 81 mg.  Stop metoprolol.  Labs/ tests ordered today include:   Orders Placed This Encounter  Procedures  . Lipid panel  . Myocardial Perfusion Imaging     Disposition:   FU with Toniesha Zellner C. Oval Linsey, MD in 2 months   Signed, Sharol Harness, MD  06/13/2015 2:28 PM    [place]

## 2015-06-12 NOTE — Progress Notes (Signed)
1.) Reason for visit: Walk In  2.) Name of MD requesting visit:Dr.Hilty  3.) H&P: Patient walked in office c/o weak spells.Stated weak spells started 2 weeks ago.Stated she just had a weak spell walking into office.Stated last week she had 10 weak spells in 1 day.Weak spells last appox 5 to 10 secs.No fast heart beat.No chest pain.No sob.Stated just has to take a deep breath after these spells.  4.) ROS related to problem: EKG was done revealed NSR rate 81.  5.) Assessment and plan per MD: Continue same medications.Schedule appointment with Dr.

## 2015-06-12 NOTE — Progress Notes (Signed)
Monica Irwin presented to the office today as a walk-in. She did not have a scheduled appointment. She was reportedly feeling fatigued and weak. She was added to the nurses schedule and they performed an EKG which shows sinus rhythm. Blood pressure was normal. She has been seeing Dr. Oval Linsey for an SVT which she is scheduled to have ablated. There was no evidence that she was in and arrhythmia today. I reviewed her EKG and vitals but did not examine the patient. She was reassured and should follow-up with Dr. Oval Linsey.  Pixie Casino, MD, [facility] Attending Cardiologist [place]

## 2015-06-12 NOTE — Patient Instructions (Signed)
Continue same medications   Appointment scheduled with Dr.O[place] Leaf 06/13/15 at 8:30am.

## 2015-06-13 ENCOUNTER — Encounter: Payer: Self-pay | Admitting: Cardiovascular Disease

## 2015-06-13 ENCOUNTER — Ambulatory Visit (INDEPENDENT_AMBULATORY_CARE_PROVIDER_SITE_OTHER): Payer: Medicare Other | Admitting: Cardiovascular Disease

## 2015-06-13 VITALS — BP 130/72 | HR 72 | Ht 64.0 in | Wt 155.2 lb

## 2015-06-13 DIAGNOSIS — R079 Chest pain, unspecified: Secondary | ICD-10-CM

## 2015-06-13 DIAGNOSIS — R5383 Other fatigue: Secondary | ICD-10-CM | POA: Diagnosis not present

## 2015-06-13 DIAGNOSIS — E785 Hyperlipidemia, unspecified: Secondary | ICD-10-CM | POA: Diagnosis not present

## 2015-06-13 DIAGNOSIS — I471 Supraventricular tachycardia: Secondary | ICD-10-CM | POA: Diagnosis not present

## 2015-06-13 NOTE — Patient Instructions (Signed)
Medication Instructions:  START ASPIRIN 81 MG DAILY  Labwork: LIPID PANEL DOWNSTAIRS SOON  Testing/Procedures: Your physician has requested that you have en exercise stress myoview. For further information please visit [email or web address]. Please follow instruction sheet, as given.   Follow-Up: Your physician recommends that you schedule a follow-up appointment in: 2 MONTH OV  If you need a refill on your cardiac medications before your next appointment, please call your pharmacy.

## 2015-06-18 ENCOUNTER — Telehealth (HOSPITAL_COMMUNITY): Payer: Self-pay

## 2015-06-18 NOTE — Telephone Encounter (Signed)
Encounter complete. 

## 2015-06-19 LAB — LIPID PANEL
CHOL/HDL RATIO: 3.9 ratio (ref <=5.0)
Cholesterol: 223 mg/dL — ABNORMAL HIGH (ref 125–200)
HDL: 57 mg/dL (ref >=46)
LDL Cholesterol: 145 mg/dL — ABNORMAL HIGH (ref <130)
Triglycerides: 107 mg/dL (ref <150)
VLDL: 21 mg/dL (ref <30)

## 2015-06-20 ENCOUNTER — Ambulatory Visit (HOSPITAL_COMMUNITY)
Admission: RE | Admit: 2015-06-20 | Discharge: 2015-06-20 | Disposition: A | Discharge status: Home / Self Care | Payer: Medicare Other | Source: Ambulatory Visit | Attending: Cardiovascular Disease | Admitting: Cardiovascular Disease

## 2015-06-20 DIAGNOSIS — Z8249 Family history of ischemic heart disease and other diseases of the circulatory system: Secondary | ICD-10-CM | POA: Diagnosis not present

## 2015-06-20 DIAGNOSIS — R079 Chest pain, unspecified: Secondary | ICD-10-CM | POA: Insufficient documentation

## 2015-06-20 DIAGNOSIS — R5383 Other fatigue: Secondary | ICD-10-CM | POA: Insufficient documentation

## 2015-06-20 LAB — MYOCARDIAL PERFUSION IMAGING
CHL CUP NUCLEAR SRS: 1
CHL CUP NUCLEAR SSS: 1
CSEPED: 7 min
Exercise duration (sec): 7 s
LV dias vol: 58 mL
LVSYSVOL: 18 mL
MPHR: 149 {beats}/min
Peak HR: 141 {beats}/min
Percent HR: 94 %
RPE: 15
Rest HR: 64 {beats}/min
SDS: 0
TID: 0.98

## 2015-06-20 MED ORDER — TECHNETIUM TC 99M SESTAMIBI GENERIC - CARDIOLITE
31.5000 | Freq: Once | INTRAVENOUS | Status: AC | PRN
  Administered 2015-06-20: 32 via INTRAVENOUS

## 2015-06-20 MED ORDER — TECHNETIUM TC 99M SESTAMIBI GENERIC - CARDIOLITE
10.9000 | Freq: Once | INTRAVENOUS | Status: AC | PRN
  Administered 2015-06-20: 10.9 via INTRAVENOUS

## 2015-06-24 ENCOUNTER — Telehealth: Payer: Self-pay | Admitting: *Deleted

## 2015-06-24 NOTE — Telephone Encounter (Signed)
-----   Message from Skeet Latch, MD sent at 06/22/2015  4:50 PM EST ----- Normal stress test.

## 2015-06-24 NOTE — Telephone Encounter (Signed)
Spoke to patient. Result given . Verbalized understanding  

## 2015-06-28 ENCOUNTER — Telehealth: Payer: Self-pay | Admitting: *Deleted

## 2015-06-28 NOTE — Telephone Encounter (Signed)
Spoke to patient.  Lipid Result given . Verbalized understanding Patient states she would like to try diet a little longer - she did not want to start any medication at present time

## 2015-06-28 NOTE — Telephone Encounter (Signed)
-----   Message from Skeet Latch, MD sent at 06/28/2015  8:10 AM EST ----- Cholesterol is slightly higher now than before.  Recommend starting atorvastatin 40 mg daily.  Repeat lfts in 3 weeks.

## 2015-07-16 ENCOUNTER — Other Ambulatory Visit (INDEPENDENT_AMBULATORY_CARE_PROVIDER_SITE_OTHER): Payer: Medicare Other | Admitting: *Deleted

## 2015-07-16 DIAGNOSIS — Z01812 Encounter for preprocedural laboratory examination: Secondary | ICD-10-CM

## 2015-07-16 DIAGNOSIS — I471 Supraventricular tachycardia: Secondary | ICD-10-CM

## 2015-07-16 LAB — CBC WITH DIFFERENTIAL/PLATELET
Basophils Absolute: 0 10*3/uL (ref 0.0–0.1)
Basophils Relative: 0 % (ref 0–1)
EOS PCT: 2 % (ref 0–5)
Eosinophils Absolute: 0.1 10*3/uL (ref 0.0–0.7)
HEMATOCRIT: 41.6 % (ref 36.0–46.0)
Hemoglobin: 14.2 g/dL (ref 12.0–15.0)
LYMPHS ABS: 1.7 10*3/uL (ref 0.7–4.0)
LYMPHS PCT: 23 % (ref 12–46)
MCH: 31.1 pg (ref 26.0–34.0)
MCHC: 34.1 g/dL (ref 30.0–36.0)
MCV: 91 fL (ref 78.0–100.0)
MONO ABS: 0.8 10*3/uL (ref 0.1–1.0)
MONOS PCT: 11 % (ref 3–12)
MPV: 10.1 fL (ref 8.6–12.4)
Neutro Abs: 4.6 10*3/uL (ref 1.7–7.7)
Neutrophils Relative %: 64 % (ref 43–77)
Platelets: 218 10*3/uL (ref 150–400)
RBC: 4.57 MIL/uL (ref 3.87–5.11)
RDW: 13.3 % (ref 11.5–15.5)
WBC: 7.2 10*3/uL (ref 4.0–10.5)

## 2015-07-16 LAB — BASIC METABOLIC PANEL
BUN: 26 mg/dL — AB (ref 7–25)
CO2: 27 mmol/L (ref 20–31)
Calcium: 9.2 mg/dL (ref 8.6–10.4)
Chloride: 103 mmol/L (ref 98–110)
Creat: 0.79 mg/dL (ref 0.60–0.93)
GLUCOSE: 89 mg/dL (ref 65–99)
POTASSIUM: 3.9 mmol/L (ref 3.5–5.3)
Sodium: 141 mmol/L (ref 135–146)

## 2015-07-19 ENCOUNTER — Telehealth: Payer: Self-pay | Admitting: *Deleted

## 2015-07-19 NOTE — Telephone Encounter (Signed)
Called patient to move procedure time back on Monday due to inclement weather this weekend. Procedure rescheduled to 11:30 a.m.    Patient to arrive at 9:30 a.m. Patient verbalized understanding and agreeable to plan.

## 2015-07-22 ENCOUNTER — Ambulatory Visit (HOSPITAL_COMMUNITY)
Admission: RE | Admit: 2015-07-22 | Discharge: 2015-07-23 | Disposition: A | Discharge status: Home / Self Care | Payer: Medicare Other | Source: Ambulatory Visit | Attending: Cardiology | Admitting: Cardiology

## 2015-07-22 ENCOUNTER — Encounter (HOSPITAL_COMMUNITY)
Admission: RE | Disposition: A | Discharge status: Home / Self Care | Payer: Self-pay | Source: Ambulatory Visit | Attending: Cardiology

## 2015-07-22 DIAGNOSIS — Z86010 Personal history of colonic polyps: Secondary | ICD-10-CM | POA: Diagnosis not present

## 2015-07-22 DIAGNOSIS — Z87442 Personal history of urinary calculi: Secondary | ICD-10-CM | POA: Insufficient documentation

## 2015-07-22 DIAGNOSIS — E039 Hypothyroidism, unspecified: Secondary | ICD-10-CM | POA: Insufficient documentation

## 2015-07-22 DIAGNOSIS — E785 Hyperlipidemia, unspecified: Secondary | ICD-10-CM | POA: Diagnosis not present

## 2015-07-22 DIAGNOSIS — I700 Atherosclerosis of aorta: Secondary | ICD-10-CM | POA: Insufficient documentation

## 2015-07-22 DIAGNOSIS — M1990 Unspecified osteoarthritis, unspecified site: Secondary | ICD-10-CM | POA: Diagnosis not present

## 2015-07-22 DIAGNOSIS — M810 Age-related osteoporosis without current pathological fracture: Secondary | ICD-10-CM | POA: Insufficient documentation

## 2015-07-22 DIAGNOSIS — I471 Supraventricular tachycardia: Secondary | ICD-10-CM | POA: Insufficient documentation

## 2015-07-22 HISTORY — PX: ELECTROPHYSIOLOGIC STUDY: SHX172A

## 2015-07-22 SURGERY — A-FLUTTER/A-TACH/SVT ABLATION
Anesthesia: LOCAL

## 2015-07-22 MED ORDER — BUPIVACAINE HCL (PF) 0.25 % IJ SOLN
INTRAMUSCULAR | Status: AC
  Filled 2015-07-22: qty 60

## 2015-07-22 MED ORDER — FENTANYL CITRATE (PF) 100 MCG/2ML IJ SOLN
INTRAMUSCULAR | Status: AC
  Filled 2015-07-22: qty 2

## 2015-07-22 MED ORDER — ISOPROTERENOL HCL 0.2 MG/ML IJ SOLN
1.0000 mg | INTRAMUSCULAR | Status: DC | PRN
  Administered 2015-07-22: 2 ug/min via INTRAVENOUS

## 2015-07-22 MED ORDER — HEPARIN (PORCINE) IN NACL 2-0.9 UNIT/ML-% IJ SOLN
INTRAMUSCULAR | Status: AC
  Filled 2015-07-22: qty 500

## 2015-07-22 MED ORDER — MIDAZOLAM HCL 5 MG/5ML IJ SOLN
INTRAMUSCULAR | Status: DC | PRN
Start: 2015-07-22 — End: 2015-07-22
  Administered 2015-07-22 (×9): 1 mg via INTRAVENOUS

## 2015-07-22 MED ORDER — MIDAZOLAM HCL 5 MG/5ML IJ SOLN
INTRAMUSCULAR | Status: AC
  Filled 2015-07-22: qty 5

## 2015-07-22 MED ORDER — LIDOCAINE HCL (PF) 1 % IJ SOLN
INTRAMUSCULAR | Status: AC
  Filled 2015-07-22: qty 30

## 2015-07-22 MED ORDER — SODIUM CHLORIDE 0.9 % IV SOLN: 250.0000 mL | INTRAVENOUS | Status: DC | PRN

## 2015-07-22 MED ORDER — ACETAMINOPHEN 325 MG PO TABS: 650.0000 mg | ORAL_TABLET | ORAL | Status: DC | PRN

## 2015-07-22 MED ORDER — HEPARIN (PORCINE) IN NACL 2-0.9 UNIT/ML-% IJ SOLN
INTRAMUSCULAR | Status: DC | PRN
  Administered 2015-07-22: 12:00:00

## 2015-07-22 MED ORDER — FENTANYL CITRATE (PF) 100 MCG/2ML IJ SOLN
INTRAMUSCULAR | Status: DC | PRN
  Administered 2015-07-22: 25 ug via INTRAVENOUS
  Administered 2015-07-22: 12.5 ug via INTRAVENOUS
  Administered 2015-07-22 (×5): 25 ug via INTRAVENOUS
  Administered 2015-07-22: 12.5 ug via INTRAVENOUS

## 2015-07-22 MED ORDER — SODIUM CHLORIDE 0.9 % IV SOLN
INTRAVENOUS | Status: DC | PRN
  Administered 2015-07-22: 50 mL/h via INTRAVENOUS

## 2015-07-22 MED ORDER — OFF THE BEAT BOOK
Freq: Once | Status: AC
  Administered 2015-07-22: 23:00:00
  Filled 2015-07-22: qty 1

## 2015-07-22 MED ORDER — BUPIVACAINE HCL (PF) 0.25 % IJ SOLN
INTRAMUSCULAR | Status: DC | PRN
  Administered 2015-07-22: 50 mL

## 2015-07-22 MED ORDER — ISOPROTERENOL HCL 0.2 MG/ML IJ SOLN
INTRAMUSCULAR | Status: AC
  Filled 2015-07-22: qty 5

## 2015-07-22 MED ORDER — ONDANSETRON HCL 4 MG/2ML IJ SOLN: 4.0000 mg | Freq: Four times a day (QID) | INTRAMUSCULAR | Status: DC | PRN

## 2015-07-22 MED ORDER — SODIUM CHLORIDE 0.9 % IJ SOLN: 3.0000 mL | Freq: Two times a day (BID) | INTRAMUSCULAR | Status: DC

## 2015-07-22 MED ORDER — LEVOTHYROXINE SODIUM 88 MCG PO TABS
88.0000 ug | ORAL_TABLET | Freq: Every day | ORAL | Status: DC
  Administered 2015-07-23: 07:00:00 88 ug via ORAL
  Filled 2015-07-22: qty 1

## 2015-07-22 MED ORDER — SODIUM CHLORIDE 0.9 % IJ SOLN: 3.0000 mL | INTRAMUSCULAR | Status: DC | PRN

## 2015-07-22 SURGICAL SUPPLY — 13 items
BAG SNAP BAND KOVER 36X36 (MISCELLANEOUS) ×2 IMPLANT
CATH EZ STEER NAV 4MM D-F CUR (ABLATOR) ×1 IMPLANT
CATH JOSEPHSON QUAD-ALLRED 6FR (CATHETERS) ×2 IMPLANT
CATH WEBSTER BI DIR CS D-F CRV (CATHETERS) ×2 IMPLANT
INTRODUCER SWARTZ SRO 8F (SHEATH) ×1 IMPLANT
PACK EP LATEX FREE (CUSTOM PROCEDURE TRAY) ×2
PACK EP LF (CUSTOM PROCEDURE TRAY) ×1 IMPLANT
PAD DEFIB LIFELINK (PAD) ×2 IMPLANT
PATCH CARTO3 (PAD) ×2 IMPLANT
SHEATH PINNACLE 6F 10CM (SHEATH) ×3 IMPLANT
SHEATH PINNACLE 7F 10CM (SHEATH) ×2 IMPLANT
SHEATH PINNACLE 8F 10CM (SHEATH) ×2 IMPLANT
SHIELD RADPAD SCOOP 12X17 (MISCELLANEOUS) ×2 IMPLANT

## 2015-07-22 NOTE — Progress Notes (Signed)
Site area: lt groin fv sheaths X 2 Site Prior to Removal:  Level  0 Pressure Applied For:  15 minutes Manual:   yes Patient Status During Pull:  stable Post Pull Site:  Level  0 Post Pull Instructions Given:  yes Post Pull Pulses Present:  yes Dressing Applied:  Small tegaderm Bedrest begins @  [id number] Comments:  IV saline locked

## 2015-07-22 NOTE — Discharge Summary (Signed)
ELECTROPHYSIOLOGY PROCEDURE DISCHARGE SUMMARY    Patient ID: Monica Irwin,  MRN: [id number], DOB/AGE: [birth year] 72 y.o.  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  Hyperlipidemia 2.  Hypothyroidism 3.  Aortic calcifications  No Known Allergies   Procedures This Admission: 1.  Electrophysiology study and radiofrequency catheter ablation on 07-22-15 by Dr Curt Bears.  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  There were no inducible arrhythmias following ablation and no early apparent complications.   Brief HPI: Monica Irwin is a 70 y.o. female with a past medical history as outlined above.  She has had increasing tachypalpitations with documented SVT.  They have failed medical therapy with BB.  Risks, benefits, and alternatives to ablation were reviewed with the patient who wished to proceed.   Hospital Course:  The patient was admitted and underwent EPS/RFCA with details as outlined above. She was monitored on telemetry overnight which demonstrated sinus rhythm, short run SVT.  Groin incision was without complication.  They were examined and considered stable for discharge to home.  Follow up will be arranged in 4 weeks.  Wound care and restrictions were reviewed with the patient prior to discharge.   Physical Exam: Filed Vitals:   07/22/15 1900 07/22/15 2000 07/22/15 2020 07/23/15 0307  BP: 104/52 108/60 108/60 132/60  Pulse: 73 62 74 55  Temp:   98 F (36.7 C) 97.8 F (36.6 C)  TempSrc:   Oral Oral  Resp: 16 16 16 16   Height:      Weight:    160 lb 11.5 oz (72.9 kg)  SpO2: 97% 95% 98% 99%    GEN- The patient is well appearing, alert and oriented x 3 today.   HEENT:  normocephalic, atraumatic; sclera clear, conjunctiva pink; hearing intact; oropharynx clear; neck supple Lungs- Clear to ausculation bilaterally, normal work of breathing.  No wheezes, rales, rhonchi Heart- Regular rate and rhythm GI- soft, non-tender, non-distended, bowel sounds present Extremities- no clubbing, cyanosis, or edema; DP/PT/radial pulses 2+ bilaterally, groin without hematoma/bruit MS- no significant deformity or atrophy Skin- warm and dry, no rash or lesion Psych- euthymic mood, full affect Neuro- strength and sensation are intact   Discharge Vitals: Blood pressure 132/60, pulse 55, temperature 97.8 F (36.6 C), temperature source Oral, resp. rate 16, height 5\' 4"  (1.626 m), weight 160 lb 11.5 oz (72.9 kg), SpO2 99 %.    Labs:   Lab Results  Component Value Date   WBC 7.2 07/16/2015   HGB 14.2 07/16/2015   HCT 41.6 07/16/2015   MCV 91.0 07/16/2015   PLT 218 07/16/2015     Recent Labs Lab 07/16/15 1228  NA 141  K 3.9  CL 103  CO2 27  BUN 26*  CREATININE 0.79  CALCIUM 9.2  GLUCOSE 89    Discharge Medications:    Medication List    TAKE these medications        aspirin 81 MG tablet  Take 81 mg by mouth daily.     Calcium Carbonate-Vitamin D 600-200 MG-UNIT Tabs  Take 1 tablet by mouth daily.     fluticasone 50 MCG/ACT nasal spray  Commonly known as:  FLONASE  Place 2 sprays into both nostrils daily as needed for allergies or rhinitis (allergies).     glucosamine-chondroitin 500-400  MG tablet  Take 1 tablet by mouth daily.     levothyroxine 88 MCG tablet  Commonly known as:  SYNTHROID, LEVOTHROID  TAKE 1 TABLET BY MOUTH DAILY BEFORE BREAKFAST        Disposition:  Discharge Instructions    Diet - low sodium heart healthy    Complete by:  As directed      Increase activity slowly    Complete by:  As directed           Follow-up Information    Follow up with Will Meredith Leeds, MD On 09/02/2015.   Specialty:  Cardiology   Why:  at  8:30AM   Contact information:   [place] [address] [phone]       Duration of Discharge Encounter: Greater than 30 minutes including physician time.  Signed, Chanetta Marshall, NP 07/23/2015 6:47 AM  I have seen and examined this patient with Chanetta Marshall.  Agree with above, note added to reflect my findings.  On exam, regular rhythm, no murmurs, lungs clear.  Groin sites without bleeding, oozing, bruising.  Had ablation for AVNRT which was uncomplicated.  Has had brief run on long RP tachycardia (~15 beats) but no signs of AVNRT.  Will plan on discharge home with regular follow up.   Will M. Camnitz MD 07/23/2015 8:26 AM

## 2015-07-22 NOTE — Discharge Instructions (Signed)
No driving for 3 days. No lifting over 5 lbs for 1 week. No sexual activity for 1 week. You may return to work in 1 week.  Keep procedure site clean & dry. If you notice increased pain, swelling, bleeding or pus, call/return!  You may shower, but no soaking baths/hot tubs/pools for 1 week.  ° ° °

## 2015-07-22 NOTE — H&P (Signed)
ID: Monica Irwin, DOB [birth year], MRN [id number]  PCP: Monica Loffler, MD Cardiologist: Monica Irwin Primary Electrophysiologist: Monica Oscar Meredith Leeds, MD   Chief Complaint  Patient presents with  . SVT    History of Present Illness: Monica Irwin is a 70 y.o. female who presents today for electrophysiology evaluation. She presents for evaluation of SVT. Monica Irwin was seen in the ED on 10/5 for SVT. She was brought in by EMS after being at home for 3 hours with a heart rate of 180. She denies any chest pain or shortness of breath during the episode. When she was unable to get it to resolve she called EMS. She received 6 mg followed by 12 mg of adenosine with conversion to sinus rhythm. She was noted have sinus arrhythmia and sinus rhythm post-conversion. She has had two episodes since being seen in the office.  They have lasted a few minutes and have stopped with vagal maneuvers.  Today, she denies symptoms of palpitations, chest pain, shortness of breath, orthopnea, PND, lower extremity edema, claudication, dizziness, presyncope, syncope, bleeding, or neurologic sequela. The patient is tolerating medications without difficulties and is otherwise without complaint today.    Past Medical History  Diagnosis Date  . Hypothyroidism   . Nephrolithiasis   . Osteoarthritis   . History of colonic polyps   . Osteoporosis   . Headache   . SVT (supraventricular tachycardia) ([place]) 05/07/2015   Past Surgical History  Procedure Laterality Date  . Back surgery      [phone]  . Partial colectomy  2005  . Thyroidectomy  2008  . Small intestine surgery       Current Outpatient Prescriptions  Medication Sig Dispense Refill  . Calcium Carbonate-Vitamin D (CALCIUM + D) 600-200 MG-UNIT TABS Take 2 tablets by mouth daily.     . fluticasone (FLONASE) 50 MCG/ACT nasal spray Place 2 sprays  into both nostrils daily as needed for allergies or rhinitis (allergies).    [name] glucosamine-chondroitin 500-400 MG tablet Take 1 tablet by mouth daily.     [name] levothyroxine (SYNTHROID, LEVOTHROID) 88 MCG tablet TAKE 1 TABLET BY MOUTH DAILY BEFORE BREAKFAST 30 tablet 10   No current facility-administered medications for this visit.    Allergies: Aspirin   Social History: The patient  reports that she has never smoked. She has never used smokeless tobacco. She reports that she drinks alcohol. She reports that she does not use illicit drugs.   Family History: The patient's family history includes Alzheimer's disease in her mother and sister; Heart attack in her brother; Heart failure in her brother; Stroke in her mother.    ROS: Please see the history of present illness. All other systems are reviewed and negative.    PHYSICAL EXAM: Filed Vitals:   07/22/15 0910  BP: 153/83  Pulse: 66  Temp: 97.7 F (36.5 C)  Resp: 16    BMI Body mass index is 27.04 kg/(m^2). GEN: Well nourished, well developed, in no acute distress  HEENT: normal  Neck: no JVD, carotid bruits, or masses Cardiac: RRR; no murmurs, rubs, or gallops,no edema  Respiratory: clear to auscultation bilaterally, normal work of breathing GI: soft, nontender, nondistended, + BS MS: no deformity or atrophy  Skin: warm and dry Neuro: Strength and sensation are intact Psych: euthymic mood, full affect  EKG: EKG is ordered today. The ekg ordered today shows sinus rhythm, LAFB, rate 78   Recent Labs: 02/08/2015: ALT 19; TSH 0.96 04/17/2015: BUN 23*; Creatinine, Ser 0.93;  Hemoglobin 15.2*; Platelets 222; Potassium 4.3; Sodium 136    Lipid Panel   Labs (Brief)       Component Value Date/Time   CHOL 206* 02/08/2015 0821   TRIG 82.0 02/08/2015 0821   HDL 53.50 02/08/2015 0821   CHOLHDL 4 02/08/2015 0821   VLDL 16.4 02/08/2015 0821   LDLCALC 136* 02/08/2015 0821    LDLDIRECT 147.5 03/02/2012 0912       Wt Readings from Last 3 Encounters:  05/16/15 157 lb 9.6 oz (71.487 kg)  05/07/15 157 lb 8 oz (71.442 kg)  04/22/15 158 lb 12 oz (72.009 kg)      Other studies Reviewed: Additional studies/ records that were reviewed today include: TTE Review of the above records today demonstrates:  - Left ventricle: The cavity size was normal. Wall thickness was normal. Systolic function was normal. The estimated ejection fraction was in the range of 60% to 65%. Wall motion was normal; there were no regional wall motion abnormalities. Doppler parameters are consistent with abnormal left ventricular relaxation (grade 1 diastolic dysfunction). - Mitral valve: Calcified annulus. - Pulmonary arteries: Systolic pressure was mildly increased. PA peak pressure: 33 mm Hg (S).   ASSESSMENT AND PLAN:  1. SVT: Had SVT in the ER requiring adenosine for conversoin. She has had similar palpitaitns for years. AVNRT and ORT are both on the differential. She is not pre-excited. Monica Irwin plan for ablation. Discussed the risks and benefits with her including bleeding, infection, tamponade, and heart block. She has agreed to the procedure. She has no further questions about the procedure today.  Monica Irwin proceed with EP study and ablation.

## 2015-07-22 NOTE — Progress Notes (Signed)
Site area: rt groin fv sheaths X 2 Site Prior to Removal:  Level 0 Pressure Applied For:  15 minutes Manual:   yes Patient Status During Pull:  Stable  Post Pull Site:  Level   0 Post Pull Instructions Given: yes  Post Pull Pulses Present: yes Dressing Applied:  Small tegaderm Bedrest begins @  Comments:

## 2015-07-23 ENCOUNTER — Encounter (HOSPITAL_COMMUNITY): Payer: Self-pay | Admitting: Cardiology

## 2015-07-23 DIAGNOSIS — E039 Hypothyroidism, unspecified: Secondary | ICD-10-CM | POA: Diagnosis not present

## 2015-07-23 DIAGNOSIS — I700 Atherosclerosis of aorta: Secondary | ICD-10-CM | POA: Diagnosis not present

## 2015-07-23 DIAGNOSIS — I471 Supraventricular tachycardia: Secondary | ICD-10-CM | POA: Diagnosis not present

## 2015-07-23 DIAGNOSIS — E785 Hyperlipidemia, unspecified: Secondary | ICD-10-CM | POA: Diagnosis not present

## 2015-07-29 ENCOUNTER — Ambulatory Visit (INDEPENDENT_AMBULATORY_CARE_PROVIDER_SITE_OTHER): Payer: Medicare Other | Admitting: Family Medicine

## 2015-07-29 ENCOUNTER — Encounter: Payer: Self-pay | Admitting: Family Medicine

## 2015-07-29 ENCOUNTER — Telehealth: Payer: Self-pay | Admitting: Cardiology

## 2015-07-29 VITALS — BP 114/62 | HR 75 | Temp 99.1°F | Ht 64.0 in | Wt 158.6 lb

## 2015-07-29 DIAGNOSIS — J069 Acute upper respiratory infection, unspecified: Secondary | ICD-10-CM | POA: Diagnosis not present

## 2015-07-29 DIAGNOSIS — J029 Acute pharyngitis, unspecified: Secondary | ICD-10-CM | POA: Diagnosis not present

## 2015-07-29 LAB — POCT INFLUENZA A/B
INFLUENZA A, POC: NEGATIVE
Influenza B, POC: NEGATIVE

## 2015-07-29 NOTE — Telephone Encounter (Signed)
Patient's dtr called in wanting to make sure they shouldn't be concerned w/ fever post ablation and any medications to avoid.  Explains that fever and cold like symptoms began in the last day or two. Explained that her current sickness is not related to ablation and to follow up w/ PCP.  We discussed avoiding decongestants when choosing OTC medications. Dtr thought there should be no concern from heart standpoint, but she just wanted to make sure.  She will keep appt this afternoon with PCP to evaluate current sickness.

## 2015-07-29 NOTE — Telephone Encounter (Signed)
New message      Pt had an ablation last Monday----she started running a fever today---100, achey and cold like symptoms.  She is seeing a doctor at 2:30 today but wanted to talk to our nurse first

## 2015-07-29 NOTE — Patient Instructions (Addendum)
Nice to meet you.  Your symptoms are likely related to a viral illness. Her flu test was negative.   you can use Coricidin for your symptoms.  You need to stay well hydrated.  if you develop productive cough, shortness of breath, persistent fever , chest pain, palpitations , or any new or change in symptoms please seek medical attention. If you're not improved in the next 3-4 days please let us know.

## 2015-07-29 NOTE — Progress Notes (Signed)
Pre visit review using our clinic review tool, if applicable. No additional management support is needed unless otherwise documented below in the visit note. 

## 2015-07-29 NOTE — Progress Notes (Signed)
Patient ID: Monica Irwin, female   DOB: [birth year], 70 y.o.   MRN: [id number]  Monica Rumps, MD Phone: [phone]  Monica Irwin is a 70 y.o. female who presents today for same-day visit  Patient notes onset of sore throat, nasal congestion, right ear fullness and discomfort, and achiness on Friday. Was gradual onset. She denies rhinorrhea with this. Minimal cough that is productive of clear sputum. She notes temperature of 100F earlier today. No shortness of breath. No chest pain or palpitations. Of note patient was hospitalized overnight last week following an SVT ablation and notes she has been doing well since discharge.   PMH: nonsmoker.   ROS see HPI  Objective  Physical Exam Filed Vitals:   07/29/15 1447  BP: 114/62  Pulse: 75  Temp: 99.1 F (37.3 C)    BP Readings from Last 3 Encounters:  07/29/15 114/62  07/23/15 111/58  06/13/15 130/72   Wt Readings from Last 3 Encounters:  07/29/15 158 lb 9.6 oz (71.94 kg)  07/23/15 160 lb 11.5 oz (72.9 kg)  06/20/15 155 lb (70.308 kg)    Physical Exam  Constitutional: She is well-developed, well-nourished, and in no distress.  Well appearing, NAD  HENT:  Head: Normocephalic and atraumatic.  Right Ear: External ear normal.  Left Ear: External ear normal.  Mouth/Throat: No oropharyngeal exudate.  Mild posterior OP erythema, normal TMs bilaterally  Eyes: Conjunctivae are normal. Pupils are equal, round, and reactive to light.  Neck: Neck supple.  Cardiovascular: Normal rate, regular rhythm and normal heart sounds.  Exam reveals no gallop and no friction rub.   No murmur heard. Pulmonary/Chest: Effort normal and breath sounds normal. No respiratory distress. She has no wheezes. She has no rales.  Lymphadenopathy:    She has no cervical adenopathy.  Neurological: She is alert. Gait normal.  Skin: Skin is warm and dry. She is not diaphoretic.     Assessment/Plan: Please see individual problem list.  Viral  upper respiratory illness Symptoms most consistent with viral URI. No focal findings indicative of bacterial cause of symptoms. Rapid flu negative and gradual onset of symptoms would argue against flu as cause. Discussed no need for antibiotics given her exam. Discussed supportive care and coricidin for cough. Given return precautions.     Orders Placed This Encounter  Procedures  . POCT Influenza A/B     Monica Irwin

## 2015-07-30 DIAGNOSIS — J069 Acute upper respiratory infection, unspecified: Secondary | ICD-10-CM | POA: Insufficient documentation

## 2015-07-30 NOTE — Assessment & Plan Note (Signed)
Symptoms most consistent with viral URI. No focal findings indicative of bacterial cause of symptoms. Rapid flu negative and gradual onset of symptoms would argue against flu as cause. Discussed no need for antibiotics given her exam. Discussed supportive care and coricidin for cough. Given return precautions.

## 2015-08-07 ENCOUNTER — Telehealth: Payer: Self-pay | Admitting: Cardiology

## 2015-08-07 NOTE — Telephone Encounter (Signed)
Informed patient ok to have mammogram. She verbalized understanding.

## 2015-08-07 NOTE — Telephone Encounter (Signed)
New message     Pt had an ablation 07-22-15.  Will it be ok for her to get her mammogram?  If not, when can she get it?

## 2015-08-15 NOTE — Progress Notes (Signed)
Cardiology Office Note   Date:  08/19/2015   ID:  Monica Irwin, DOB [birth year], MRN [id number]  PCP:  Owens Loffler, MD  Cardiologist:   Sharol Harness, MD   Chief Complaint  Patient presents with  . Follow-up    post exercise stress myoview//pt states no Sx.     Patient ID: Monica Irwin is a 70 y.o. female with hyperlipidemia, hypothyroidism and aortic calcifications who presents for follow up on SVT.   Interval History 08/19/15: After her last appointment Monica Irwin was referred for exercise Cardiolite that was negative for ischemia.  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  Since then she has been feeling well.  She denies any recurrent SVT.  Episodes occurred once every 6 months.  She did report having a cold but is otherwise well.  She has not been exercising regularly due to the weather.  She is not interested in starting any medications for her cholesterol but instead plans to improve her diet and increase her exercise.  She attributes her worsening lipid profile to eating too much during the holidays.  She denies chest pain, shortness of breath, lower extremity edema, orthopnea or PND.   Interval History 06/13/15: Monica Irwin was referred to Dr. Curt Bears for her SVT.  She is currently awaiting ablation next month.  She had an echo that revealed normal systolic function and grade one diastolic dysfunction.  Yesterday she walked in to clinic with a complaint of weak spells.  She was evaluated by the nursing staff and had no arrhythmias on ECG.  She was reassured and scheduled for follow up today.  She reports "weak spells" that have been occurring frequently.  Last week she had several episodes that lasted 10-15 seconds and recurred up to 10 times per day. This week they last for the same duration but have recurred 3-4 times per day. There is no associated shortness of breath and she doesn't feel as though her heart is racing, though it feels like it is  about to start racing again. After the episode is over she feels better after she takes a deep breath then exhales. She does not feel particularly anxious and denies any chest pain or presyncope. She's feeling generally fatigued but otherwise well.  The symptoms occur more commonly when she is working out in the yard or walking.They never occur at night or at rest.  Ms. Mclamb has been working hard to lower her cholesterol. She changed her diet and increased her exercise. She has lost 5 pounds since I last saw her.  History of Present Illness 05/07/15: Monica Irwin was seen in the ED on 10/5 for SVT.  She was brought in by EMS after being at home for 3 hours with a heart rate of 180.  She denies any chest pain or shortness of breath during the episode.  When she was unable to get it to resolve she called EMS.  She received 6 mg followed by 12 mg of adenosine with conversion to sinus rhythm.  She was noted have sinus arrhythmia and sinus rhythm post-conversion.  She was started on metoprolol which stopped about one week ago.  In the ED she was incidentally noted to have mild cardiomegaly on chest x-ray.  Since she was seen in the emergency department she's not had any further episodes of heart racing. However she does note that this has happened to her several times since she was in her 27s. Typically when it happens  it lasts for about 20 minutes and she is able to get it to stop by laying down and taking deep breaths. It's never lasted as long as it did in early October. She does not like taking medications and noticed that while she was on metoprolol she felt sluggish and fatigued.  She denies lower extremity edema, orthopnea, PND, palpitations, lightheadedness or dizziness.   Monica Irwin brother had a heart attack at age 89 and died. She has another living brother who has significant coronary artery disease as well.   She exercises by working in her yard daily and walks for 20-30 minutes most mornings.  She has no chest pain or shortness of breath with these activities. She does not smoke and drinks alcohol very occasionally.   Past Medical History  Diagnosis Date  . Hypothyroidism   . Nephrolithiasis   . Osteoarthritis   . History of colonic polyps   . Osteoporosis   . Headache   . SVT (supraventricular tachycardia) ([place]) 05/07/2015  . 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    Past Surgical History  Procedure Laterality Date  . Back surgery      [id number]  . Partial colectomy  2005  . Thyroidectomy  2008  . Small intestine surgery    . Electrophysiologic study N/A 07/22/2015    Procedure: SVT Ablation;  Surgeon: Will Meredith Leeds, MD;  Location: [facility];  Service: Cardiovascular;  Laterality: N/A;     Current Outpatient Prescriptions  Medication Sig Dispense Refill  . aspirin 81 MG tablet Take 81 mg by mouth daily.    . Calcium Carbonate-Vitamin D (CALCIUM + D) 600-200 MG-UNIT TABS Take 1 tablet by mouth daily.     . fluticasone (FLONASE) 50 MCG/ACT nasal spray Place 2 sprays into both nostrils daily as needed for allergies or rhinitis (allergies).    [name] glucosamine-chondroitin 500-400 MG tablet Take 1 tablet by mouth daily.      [name] levothyroxine (SYNTHROID, LEVOTHROID) 88 MCG tablet TAKE 1 TABLET BY MOUTH DAILY BEFORE BREAKFAST 30 tablet 10   No current facility-administered medications for this visit.    Allergies:   Review of patient's allergies indicates no known allergies.    Social History:  The patient  reports that she has never smoked. She has never used smokeless tobacco. She reports that she drinks alcohol. She reports that she does not use illicit drugs.   Family History:  The patient's family history includes Alzheimer's disease in her mother and sister; Heart attack in her brother; Heart failure in her brother; Stroke in her mother.    ROS:  Please see the history of present illness.    Otherwise, review of systems are positive for none.   All other systems are reviewed and negative.    PHYSICAL EXAM: VS:  BP 124/76 mmHg  Pulse 70  Ht _0  (1.626 m)  Wt 71.124 kg (156 lb 12.8 oz)  BMI 26.90 kg/m2 , BMI Body mass index is 26.9 kg/(m^2). GENERAL:  Well appearing HEENT:  Pupils equal round and reactive, fundi not visualized, oral mucosa unremarkable NECK:  No jugular venous distention, waveform within normal limits, carotid upstroke brisk and symmetric, no bruits LYMPHATICS:  No cervical adenopathy LUNGS:  Clear to auscultation bilaterally HEART:  RRR.  PMI not displaced or sustained,S1 and S2 within normal limits, no S3, no S4, no clicks, no rubs, no murmurs.  No RV heave. ABD:  Flat, positive bowel  sounds normal in frequency in pitch, no bruits, no rebound, no guarding, no midline pulsatile mass, no hepatomegaly, no splenomegaly EXT:  2 plus pulses throughout, no edema, no cyanosis no clubbing SKIN:  No rashes no nodules NEURO:  Cranial nerves II through XII grossly intact, motor grossly intact throughout PSYCH:  Cognitively intact, oriented to person place and time   EKG:  EKG is not ordered today.  Echo 05/15/15: Study Conclusions  - Left ventricle: The cavity size was normal. Wall thickness was normal. Systolic function was normal. The estimated ejection fraction was in the range of 60% to 65%. Wall motion was normal; there were no regional wall motion abnormalities. Doppler parameters are consistent with abnormal left ventricular relaxation (grade 1 diastolic dysfunction). - Mitral valve: Calcified annulus. - Pulmonary arteries: Systolic pressure was mildly increased. PA peak pressure: 33 mm Hg (S).  Lexiscan Cardiolite 06/20/15:  Nuclear stress EF: 69%.  The left ventricular ejection fraction is hyperdynamic (>65%).  There was no ST segment deviation noted during stress.  The study is normal.  This is a low risk study.  Normal  exercise myocardial perfusion study. Nl LV function; EF 69%. Patient exercised to a 7 met workload and 94% APMHR without chest pain or ECG changes of ischemia. Rare PAC in recovery.   Recent Labs: 02/08/2015: ALT 19; TSH 0.96 07/16/2015: BUN 26*; Creat 0.79; Hemoglobin 14.2; Platelets 218; Potassium 3.9; Sodium 141    Lipid Panel    Component Value Date/Time   CHOL 223* 06/18/2015 0810   TRIG 107 06/18/2015 0810   HDL 57 06/18/2015 0810   CHOLHDL 3.9 06/18/2015 0810   VLDL 21 06/18/2015 0810   LDLCALC 145* 06/18/2015 0810   LDLDIRECT 147.5 03/02/2012 0912      Wt Readings from Last 3 Encounters:  08/19/15 71.124 kg (156 lb 12.8 oz)  07/29/15 71.94 kg (158 lb 9.6 oz)  07/23/15 72.9 kg (160 lb 11.5 oz)      ASSESSMENT AND PLAN:   # Exertional fatigue: Resolved.  Stress test was negative for ischemia.    # AVNRT: No recurrent episodes of AVNRT since ablation.  She is off metoprolol.  # Hyperlipidemia: ASCVD 10 year risk is 10.2%.  She remians uninteresting in statin therapy.  She plans to increase her walking and limit fried foods and sodas.  She will have lipids repeated with her PCP in 6 months.  We will also re-evaluate in 1 year.  # CV Disease Prevention: Continue aspirin 81 mg daily.  Current medicines are reviewed at length with the patient today.  The patient does not have concerns regarding medicines.  The following changes have been made:    Labs/ tests ordered today include:   No orders of the defined types were placed in this encounter.     Disposition:   FU with Jariah Tarkowski C. Oval Linsey, MD in 1 year.   Signed, Sharol Harness, MD  08/19/2015 1:38 PM    [place]

## 2015-08-19 ENCOUNTER — Ambulatory Visit (INDEPENDENT_AMBULATORY_CARE_PROVIDER_SITE_OTHER): Payer: Medicare Other | Admitting: Cardiovascular Disease

## 2015-08-19 ENCOUNTER — Encounter: Payer: Self-pay | Admitting: Cardiovascular Disease

## 2015-08-19 VITALS — BP 124/76 | HR 70 | Ht 64.0 in | Wt 156.8 lb

## 2015-08-19 DIAGNOSIS — E785 Hyperlipidemia, unspecified: Secondary | ICD-10-CM

## 2015-08-19 DIAGNOSIS — I4719 Other supraventricular tachycardia: Secondary | ICD-10-CM

## 2015-08-19 DIAGNOSIS — I471 Supraventricular tachycardia: Secondary | ICD-10-CM | POA: Diagnosis not present

## 2015-08-19 HISTORY — DX: Supraventricular tachycardia: I47.1

## 2015-08-19 HISTORY — DX: Other supraventricular tachycardia: I47.19

## 2015-08-19 NOTE — Patient Instructions (Signed)
Dr Olena Leatherwood recommends that you schedule a follow-up appointment in 1 year. You will receive a reminder letter in the mail two months in advance. If you don't receive a letter, please call our office to schedule the follow-up appointment.  If you need a refill on your cardiac medications before your next appointment, please call your pharmacy.

## 2015-08-20 ENCOUNTER — Encounter: Payer: Medicare Other | Admitting: Cardiology

## 2015-08-27 ENCOUNTER — Encounter: Payer: Self-pay | Admitting: Internal Medicine

## 2015-08-27 ENCOUNTER — Ambulatory Visit (INDEPENDENT_AMBULATORY_CARE_PROVIDER_SITE_OTHER): Payer: Medicare Other | Admitting: Internal Medicine

## 2015-08-27 VITALS — BP 120/80 | HR 68 | Temp 97.5°F | Wt 155.5 lb

## 2015-08-27 DIAGNOSIS — J309 Allergic rhinitis, unspecified: Secondary | ICD-10-CM | POA: Diagnosis not present

## 2015-08-27 MED ORDER — PREDNISONE 10 MG PO TABS: ORAL_TABLET | ORAL | Status: DC

## 2015-08-27 NOTE — Progress Notes (Signed)
Pre visit review using our clinic review tool, if applicable. No additional management support is needed unless otherwise documented below in the visit note. 

## 2015-08-27 NOTE — Patient Instructions (Signed)
Allergic Rhinitis Allergic rhinitis is when the mucous membranes in the nose respond to allergens. Allergens are particles in the air that cause your body to have an allergic reaction. This causes you to release allergic antibodies. Through a chain of events, these eventually cause you to release histamine into the blood stream. Although meant to protect the body, it is this release of histamine that causes your discomfort, such as frequent sneezing, congestion, and an itchy, runny nose.  CAUSES Seasonal allergic rhinitis (hay fever) is caused by pollen allergens that may come from grasses, trees, and weeds. Year-round allergic rhinitis (perennial allergic rhinitis) is caused by allergens such as house dust mites, pet dander, and mold spores. SYMPTOMS  Nasal stuffiness (congestion).  Itchy, runny nose with sneezing and tearing of the eyes. DIAGNOSIS Your health care provider can help you determine the allergen or allergens that trigger your symptoms. If you and your health care provider are unable to determine the allergen, skin or blood testing may be used. Your health care provider will diagnose your condition after taking your health history and performing a physical exam. Your health care provider may assess you for other related conditions, such as asthma, pink eye, or an ear infection. TREATMENT Allergic rhinitis does not have a cure, but it can be controlled by:  Medicines that block allergy symptoms. These may include allergy shots, nasal sprays, and oral antihistamines.  Avoiding the allergen. Hay fever may often be treated with antihistamines in pill or nasal spray forms. Antihistamines block the effects of histamine. There are over-the-counter medicines that may help with nasal congestion and swelling around the eyes. Check with your health care provider before taking or giving this medicine. If avoiding the allergen or the medicine prescribed do not work, there are many new medicines  your health care provider can prescribe. Stronger medicine may be used if initial measures are ineffective. Desensitizing injections can be used if medicine and avoidance does not work. Desensitization is when a patient is given ongoing shots until the body becomes less sensitive to the allergen. Make sure you follow up with your health care provider if problems continue. HOME CARE INSTRUCTIONS It is not possible to completely avoid allergens, but you can reduce your symptoms by taking steps to limit your exposure to them. It helps to know exactly what you are allergic to so that you can avoid your specific triggers. SEEK MEDICAL CARE IF:  You have a fever.  You develop a cough that does not stop easily (persistent).  You have shortness of breath.  You start wheezing.  Symptoms interfere with normal daily activities.   This information is not intended to replace advice given to you by your health care provider. Make sure you discuss any questions you have with your health care provider.   Document Released: 03/24/2001 Document Revised: 07/20/2014 Document Reviewed: 03/06/2013 Elsevier Interactive Patient Education 2016 [employer]  

## 2015-08-27 NOTE — Progress Notes (Signed)
HPI  Pt presents to the clinic today with c/o ear fullness, runny nose and nasal congestion. This started 3 weeks ago. She is blowing yellow mucous out of her nose. She denies sore throat, cough or chest congestion. She denies fever, chills or body aches. She has tried Coricdin and Flonase with some relief. She has no history of seasonal allergies. She has not had sick contacts that she is aware of.  Review of Systems    Past Medical History  Diagnosis Date  . Hypothyroidism   . Nephrolithiasis   . Osteoarthritis   . History of colonic polyps   . Osteoporosis   . Headache   . SVT (supraventricular tachycardia) ([place]) 05/07/2015  . 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    Family History  Problem Relation Age of Onset  . Stroke Mother   . Alzheimer's disease Mother   . Heart failure Brother   . Alzheimer's disease Sister   . Heart attack Brother     Social History   Social History  . Marital Status: Divorced    Spouse Name: N/A  . Number of Children: 1  . Years of Education: N/A   Occupational History  . retired [place] Main Topics  . Smoking status: Never Smoker   . Smokeless tobacco: Never Used  . Alcohol Use: 0.0 oz/week    0 Standard drinks or equivalent per week     Comment: rarely  . Drug Use: No  . Sexual Activity: No   Other Topics Concern  . Not on file   Social History Narrative   Regular exercise--no            Epworth Sleepiness Scale      Total: 4                   No Known Allergies   Constitutional:  Denies headache, fatigue, fever or abrupt weight changes.  HEENT:  Positive runny nose, nasal congestion. Denies eye redness, ear pain, ringing in the ears, wax buildup, runny nose or sore throat. Respiratory: Denies cough, difficulty breathing or shortness of breath.  Cardiovascular: Denies chest pain, chest tightness, palpitations or  swelling in the hands or feet.   No other specific complaints in a complete review of systems (except as listed in HPI above).  Objective:   BP 120/80 mmHg  Pulse 68  Temp(Src) 97.5 F (36.4 C) (Oral)  Wt 155 lb 8 oz (70.534 kg)  SpO2 98%  General: Appears her stated age, in NAD. HEENT: Head: normal shape and size, no sinus tenderness noted; Eyes: sclera white, no icterus, conjunctiva pink; Ears: Tm's gray and intact, normal light reflex; Nose: mucosa boggy and moist, septum midline; Throat/Mouth: + PND. Teeth present, mucosa pink and moist, no exudate noted, no lesions or ulcerations noted.  Neck:  No adenopathy noted Cardiovascular: Normal rate and rhythm. S1,S2 noted.  No murmur, rubs or gallops noted. Pulmonary/Chest: Normal effort and positive vesicular breath sounds. No respiratory distress. No wheezes, rales or ronchi noted.      Assessment & Plan:   Allergic Rhinitis  Can use a Neti Pot which can be purchased from your local drug store. Flonase 2 sprays each nostril for 3 days and then as needed. eRx for Pred Taper x 6 days  RTC as needed or if symptoms persist.

## 2015-09-01 NOTE — Progress Notes (Signed)
Electrophysiology Office Note   Date:  09/02/2015   ID:  Monica Irwin, Monica Irwin [birth year], MRN [id number]  PCP:  Owens Loffler, MD  Cardiologist:  Oval Linsey  Primary Electrophysiologist:  Manoj Enriquez Meredith Leeds, MD    Chief Complaint  Patient presents with  . s/p ablation     History of Present Illness: Monica Irwin is a 70 y.o. female who presents today for electrophysiology evaluation.   She presented for evaluation of SVT in November. At that time she called EMS with 3 hours of a heart rate of 180. The SVT broke with adenosine. On 1/9, she had an ablation for AVNRT. She returns today for follow-up. Since her ablation she has not had any recurrent SVT. She says that she's been feeling well without any complaints. She has not had any complications from her legs aren't. She has had no further palpitations.   Today, she denies symptoms of palpitations, chest pain, shortness of breath, orthopnea, PND, lower extremity edema, claudication, dizziness, presyncope, syncope, bleeding, or neurologic sequela. The patient is tolerating medications without difficulties and is otherwise without complaint today.    Past Medical History  Diagnosis Date  . Hypothyroidism   . Nephrolithiasis   . Osteoarthritis   . History of colonic polyps   . Osteoporosis   . Headache   . SVT (supraventricular tachycardia) ([place]) 05/07/2015  . AVNRT (AV nodal re-entry tachycardia) ([place]) 08/19/2015    S/p successful ablation 07/2015.   Past Surgical History  Procedure Laterality Date  . Back surgery      [phone]  . Partial colectomy  2005  . Thyroidectomy  2008  . Small intestine surgery    . Electrophysiologic study N/A 07/22/2015    Procedure: SVT Ablation;  Surgeon: Tyvon Eggenberger Meredith Leeds, MD;  Location: [facility];  Service: Cardiovascular;  Laterality: N/A;     Current Outpatient Prescriptions  Medication Sig Dispense Refill  . aspirin 81 MG tablet Take 81 mg by mouth daily.    .  Calcium Carbonate-Vitamin D (CALCIUM + D) 600-200 MG-UNIT TABS Take 1 tablet by mouth daily.     . fluticasone (FLONASE) 50 MCG/ACT nasal spray Place 2 sprays into both nostrils daily as needed for allergies or rhinitis (allergies).    [name] glucosamine-chondroitin 500-400 MG tablet Take 1 tablet by mouth daily.      [name] levothyroxine (SYNTHROID, LEVOTHROID) 88 MCG tablet TAKE 1 TABLET BY MOUTH DAILY BEFORE BREAKFAST 30 tablet 10  . predniSONE (DELTASONE) 10 MG tablet Take 3 tabs on days 1-2, take 2 tabs on days 3-4, take 1 tab on days 5-6 12 tablet 0   No current facility-administered medications for this visit.    Allergies:   Review of patient's allergies indicates no known allergies.   Social History:  The patient  reports that she has never smoked. She has never used smokeless tobacco. She reports that she drinks alcohol. She reports that she does not use illicit drugs.   Family History:  The patient's family history includes Alzheimer's disease in her mother and sister; Heart attack in her brother; Heart failure in her brother; Stroke in her mother.    ROS:  Please see the history of present illness.   All other systems are reviewed and negative.    PHYSICAL EXAM: VS:  BP 132/78 mmHg  Pulse 61  Ht 5\' 4"  (1.626 m)  Wt 158 lb (71.668 kg)  BMI 27.11 kg/m2 , BMI Body mass index is 27.11 kg/(m^2). GEN: Well  nourished, well developed, in no acute distress HEENT: normal Neck: no JVD, carotid bruits, or masses Cardiac: RRR; no murmurs, rubs, or gallops,no edema  Respiratory:  clear to auscultation bilaterally, normal work of breathing GI: soft, nontender, nondistended, + BS MS: no deformity or atrophy Skin: warm and dry Neuro:  Strength and sensation are intact Psych: euthymic mood, full affect  EKG:  EKG is not ordered today. The ekg ordered today shows sinus rhythm, nonspecific T wave abnormality  Recent Labs: 02/08/2015: ALT 19; TSH 0.96 07/16/2015: BUN 26*; Creat 0.79; Hemoglobin  14.2; Platelets 218; Potassium 3.9; Sodium 141    Lipid Panel     Component Value Date/Time   CHOL 223* 06/18/2015 0810   TRIG 107 06/18/2015 0810   HDL 57 06/18/2015 0810   CHOLHDL 3.9 06/18/2015 0810   VLDL 21 06/18/2015 0810   LDLCALC 145* 06/18/2015 0810   LDLDIRECT 147.5 03/02/2012 0912     Wt Readings from Last 3 Encounters:  09/02/15 158 lb (71.668 kg)  08/27/15 155 lb 8 oz (70.534 kg)  08/19/15 156 lb 12.8 oz (71.124 kg)      Other studies Reviewed: Additional studies/ records that were reviewed today include: TTE 05/15/15  Review of the above records today demonstrates:  - Normal LV systolic function; grade 1 diastolic dysfunction; trace MR; mild TR; mildly elevated pulmonary pressure.   ASSESSMENT AND PLAN:  1.  SVT: No evidence for recurrent arrhythmia. She is currently off of metoprolol. She is feeling well without any major complaints. At this time we Atisha Hamidi not adjust her medications. I have told her that she should call us back if she has any further palpitations but we Karna Abed see her as needed.  Current medicines are reviewed at length with the patient today.   The patient does not have concerns regarding her medicines.  The following changes were made today:  none  Labs/ tests ordered today include:  No orders of the defined types were placed in this encounter.     Disposition:   FU with Tekela Garguilo PRN  Signed, Tank Difiore Meredith Leeds, MD  09/02/2015 8:40 AM     [facility] [address] [place] [address] [phone] (office) [phone] (fax)

## 2015-09-02 ENCOUNTER — Encounter: Payer: Self-pay | Admitting: Cardiology

## 2015-09-02 ENCOUNTER — Ambulatory Visit (INDEPENDENT_AMBULATORY_CARE_PROVIDER_SITE_OTHER): Payer: Medicare Other | Admitting: Cardiology

## 2015-09-02 VITALS — BP 132/78 | HR 61 | Ht 64.0 in | Wt 158.0 lb

## 2015-09-02 DIAGNOSIS — I471 Supraventricular tachycardia: Secondary | ICD-10-CM

## 2015-09-02 DIAGNOSIS — Z9889 Other specified postprocedural states: Secondary | ICD-10-CM

## 2015-09-02 DIAGNOSIS — Z8679 Personal history of other diseases of the circulatory system: Secondary | ICD-10-CM

## 2015-09-02 NOTE — Patient Instructions (Signed)
Medication Instructions:  Your physician recommends that you continue on your current medications as directed. Please refer to the Current Medication list given to you today.  Labwork: None ordered  Testing/Procedures: None ordered  Follow-Up: No follow up is needed at this time with Dr. Camnitz.  He will see you on an as needed basis.  If you need a refill on your cardiac medications before your next appointment, please call your pharmacy.  Thank you for choosing [facility]!!   Susy Placzek, RN [phone]      

## 2015-09-06 NOTE — Addendum Note (Signed)
Addended by: Freada Bergeron on: 09/06/2015 08:12 AM   Modules accepted: Orders

## 2015-09-17 ENCOUNTER — Encounter: Payer: Self-pay | Admitting: Family Medicine

## 2015-09-30 ENCOUNTER — Ambulatory Visit (INDEPENDENT_AMBULATORY_CARE_PROVIDER_SITE_OTHER): Payer: Medicare Other | Admitting: Family Medicine

## 2015-09-30 ENCOUNTER — Encounter: Payer: Self-pay | Admitting: Family Medicine

## 2015-09-30 VITALS — BP 124/68 | HR 75 | Temp 97.4°F | Ht 64.0 in | Wt 159.5 lb

## 2015-09-30 DIAGNOSIS — Z1159 Encounter for screening for other viral diseases: Secondary | ICD-10-CM | POA: Diagnosis not present

## 2015-09-30 DIAGNOSIS — R1011 Right upper quadrant pain: Secondary | ICD-10-CM | POA: Diagnosis not present

## 2015-09-30 DIAGNOSIS — R1013 Epigastric pain: Secondary | ICD-10-CM

## 2015-09-30 LAB — LIPASE: LIPASE: 23 U/L (ref 11.0–59.0)

## 2015-09-30 LAB — CBC WITH DIFFERENTIAL/PLATELET
BASOS PCT: 0.5 % (ref 0.0–3.0)
Basophils Absolute: 0 10*3/uL (ref 0.0–0.1)
EOS ABS: 0.1 10*3/uL (ref 0.0–0.7)
Eosinophils Relative: 1.9 % (ref 0.0–5.0)
HEMATOCRIT: 43.9 % (ref 36.0–46.0)
Hemoglobin: 14.8 g/dL (ref 12.0–15.0)
LYMPHS PCT: 22.4 % (ref 12.0–46.0)
Lymphs Abs: 1.3 10*3/uL (ref 0.7–4.0)
MCHC: 33.6 g/dL (ref 30.0–36.0)
MCV: 91.6 fl (ref 78.0–100.0)
Monocytes Absolute: 0.5 10*3/uL (ref 0.1–1.0)
Monocytes Relative: 8.7 % (ref 3.0–12.0)
NEUTROS ABS: 3.9 10*3/uL (ref 1.4–7.7)
Neutrophils Relative %: 66.5 % (ref 43.0–77.0)
PLATELETS: 264 10*3/uL (ref 150.0–400.0)
RBC: 4.79 Mil/uL (ref 3.87–5.11)
RDW: 13.6 % (ref 11.5–15.5)
WBC: 5.8 10*3/uL (ref 4.0–10.5)

## 2015-09-30 LAB — BASIC METABOLIC PANEL
BUN: 22 mg/dL (ref 6–23)
CO2: 30 meq/L (ref 19–32)
CREATININE: 0.82 mg/dL (ref 0.40–1.20)
Calcium: 9.4 mg/dL (ref 8.4–10.5)
Chloride: 103 mEq/L (ref 96–112)
GFR: 72.87 mL/min (ref >60.00)
Glucose, Bld: 94 mg/dL (ref 70–99)
Potassium: 4.5 mEq/L (ref 3.5–5.1)
Sodium: 139 mEq/L (ref 135–145)

## 2015-09-30 LAB — HEPATIC FUNCTION PANEL
ALBUMIN: 4 g/dL (ref 3.5–5.2)
ALT: 31 U/L (ref 0–35)
AST: 24 U/L (ref 0–37)
Alkaline Phosphatase: 85 U/L (ref 39–117)
BILIRUBIN DIRECT: 0.1 mg/dL (ref 0.0–0.3)
TOTAL PROTEIN: 6.5 g/dL (ref 6.0–8.3)
Total Bilirubin: 0.3 mg/dL (ref 0.2–1.2)

## 2015-09-30 NOTE — Progress Notes (Signed)
Dr. Frederico Hamman T. Toree Edling, MD, [facility] [place], [address] Phone: [phone] Fax: [phone]  09/30/2015  Patient: Monica Irwin, MRN: [id number], DOB: 19-Apr-1946, 70 y.o.  Primary Physician:  Owens Loffler, MD   Chief Complaint  Patient presents with  . Flank Pain    Right Side-Constant Pain   Subjective:   Monica Irwin is a 70 y.o. very pleasant female patient who presents with the following:  Hurting bad on the R side, then that night took 2 alleve. Sunday it felt better. Today it hurt a little more.  Does not feel like kidney stones. She also has a history of diverticulitis, and it does not feel similar to this.  She does still have her gallbladder and her appendix.  She has been eating and drinking normally.  She is passing gas and urinating.  RUQ tender to palpation  Past Medical History, Surgical History, Social History, Family History, Problem List, Medications, and Allergies have been reviewed and updated if relevant.  Patient Active Problem List   Diagnosis Date Noted  . AVNRT (AV nodal re-entry tachycardia) ([facility]) 08/19/2015  . Viral upper respiratory illness 07/30/2015  . Aortic calcification ([facility]) 04/22/2015  . Right-sided headache 02/21/2015  . Major depressive disorder, recurrent episode, in full remission ([facility]) 07/24/2010  . OSTEOPOROSIS 07/24/2010  . INSOMNIA 08/29/2009  . HYPERLIPIDEMIA 12/19/2008  . HYPOTHYROIDISM 07/02/2008  . OSTEOARTHRITIS 07/02/2008  . NEPHROLITHIASIS, HX OF 07/02/2008    Past Medical History  Diagnosis Date  . Hypothyroidism   . Nephrolithiasis   . Osteoarthritis   . History of colonic polyps   . Osteoporosis   . Headache   . SVT (supraventricular tachycardia) ([facility]) 05/07/2015  . AVNRT (AV nodal re-entry tachycardia) ([facility]) 08/19/2015    S/p successful ablation 07/2015.    Past Surgical History  Procedure Laterality Date  . Back surgery     [phone]  . Partial colectomy  2005  . Thyroidectomy  2008  . Small intestine surgery    . Electrophysiologic study N/A 07/22/2015    Procedure: SVT Ablation;  Surgeon: Will Meredith Leeds, MD;  Location: [facility];  Service: Cardiovascular;  Laterality: N/A;    Social History   Social History  . Marital Status: Divorced    Spouse Name: N/A  . Number of Children: 1  . Years of Education: N/A   Occupational History  . retired [employer] History Main Topics  . Smoking status: Never Smoker   . Smokeless tobacco: Never Used  . Alcohol Use: 0.0 oz/week    0 Standard drinks or equivalent per week     Comment: rarely  . Drug Use: No  . Sexual Activity: No   Other Topics Concern  . Not on file   Social History Narrative   Regular exercise--no            Epworth Sleepiness Scale      Total: 4                   Family History  Problem Relation Age of Onset  . Stroke Mother   . Alzheimer's disease Mother   . Heart failure Brother   . Alzheimer's disease Sister   . Heart attack Brother     No Known Allergies  Medication list reviewed and updated in full in [place].  ROS: GEN: Acute illness details above GI: Tolerating  PO intake GU: maintaining adequate hydration and urination Pulm: No SOB Interactive and getting along well at home.  Otherwise, ROS is as per the HPI.   Objective:   BP 124/68 mmHg  Pulse 75  Temp(Src) 97.4 F (36.3 C) (Oral)  Ht 5\' 4"  (1.626 m)  Wt 159 lb 8 oz (72.349 kg)  BMI 27.36 kg/m2  GEN: WDWN, NAD, Non-toxic, A & O x 3 HEENT: Atraumatic, Normocephalic. Neck supple. No masses, No LAD. Ears and Nose: No external deformity. CV: RRR, No M/G/R. No JVD. No thrill. No extra heart sounds. PULM: CTA B, no wheezes, crackles, rhonchi. No retractions. No resp. distress. No accessory muscle use. ABD: S, RUQ TTP, ND, +BS. No rebound. No HSM. EXTR: No c/c/e NEURO Normal gait.    PSYCH: Normally interactive. Conversant. Not depressed or anxious appearing.  Calm demeanor.     Laboratory and Imaging Data: Results for orders placed or performed in visit on [id number]  Basic metabolic panel  Result Value Ref Range   Sodium 139 135 - 145 mEq/L   Potassium 4.5 3.5 - 5.1 mEq/L   Chloride 103 96 - 112 mEq/L   CO2 30 19 - 32 mEq/L   Glucose, Bld 94 70 - 99 mg/dL   BUN 22 6 - 23 mg/dL   Creatinine, Ser 0.82 0.40 - 1.20 mg/dL   Calcium 9.4 8.4 - 10.5 mg/dL   GFR 72.87 >60.00 mL/min  CBC with Differential/Platelet  Result Value Ref Range   WBC 5.8 4.0 - 10.5 K/uL   RBC 4.79 3.87 - 5.11 Mil/uL   Hemoglobin 14.8 12.0 - 15.0 g/dL   HCT 43.9 36.0 - 46.0 %   MCV 91.6 78.0 - 100.0 fl   MCHC 33.6 30.0 - 36.0 g/dL   RDW 13.6 11.5 - 15.5 %   Platelets 264.0 150.0 - 400.0 K/uL   Neutrophils Relative % 66.5 43.0 - 77.0 %   Lymphocytes Relative 22.4 12.0 - 46.0 %   Monocytes Relative 8.7 3.0 - 12.0 %   Eosinophils Relative 1.9 0.0 - 5.0 %   Basophils Relative 0.5 0.0 - 3.0 %   Neutro Abs 3.9 1.4 - 7.7 K/uL   Lymphs Abs 1.3 0.7 - 4.0 K/uL   Monocytes Absolute 0.5 0.1 - 1.0 K/uL   Eosinophils Absolute 0.1 0.0 - 0.7 K/uL   Basophils Absolute 0.0 0.0 - 0.1 K/uL  Hepatic function panel  Result Value Ref Range   Total Bilirubin 0.3 0.2 - 1.2 mg/dL   Bilirubin, Direct 0.1 0.0 - 0.3 mg/dL   Alkaline Phosphatase 85 39 - 117 U/L   AST 24 0 - 37 U/L   ALT 31 0 - 35 U/L   Total Protein 6.5 6.0 - 8.3 g/dL   Albumin 4.0 3.5 - 5.2 g/dL  Lipase  Result Value Ref Range   Lipase 23.0 11.0 - 59.0 U/L  Hepatitis C antibody  Result Value Ref Range   HCV Ab NEGATIVE NEGATIVE     Assessment and Plan:   Abdominal pain, right upper quadrant - Plan: Basic metabolic panel, CBC with Differential/Platelet, Hepatic function panel, US Abdomen Limited RUQ  Abdominal pain, epigastric - Plan: Lipase  Need for hepatitis C screening test - Plan: Hepatitis C antibody  History and exam most  consistent with gallstones.  Check basic laboratories to rule out other acute processes.  She is improved today compared to yesterday.  Follow-up: No Follow-up on file.  Orders Placed This Encounter  Procedures  . US Abdomen  Limited RUQ  . Basic metabolic panel  . CBC with Differential/Platelet  . Hepatic function panel  . Lipase  . Hepatitis C antibody    Signed,  Sarthak Rubenstein T. Suriyah Vergara, MD   Patient's Medications  New Prescriptions   No medications on file  Previous Medications   ASPIRIN 81 MG TABLET    Take 81 mg by mouth daily.   CALCIUM CARBONATE-VITAMIN D (CALCIUM + D) 600-200 MG-UNIT TABS    Take 1 tablet by mouth daily.    FLUTICASONE (FLONASE) 50 MCG/ACT NASAL SPRAY    Place 2 sprays into both nostrils daily as needed for allergies or rhinitis (allergies).   GLUCOSAMINE-CHONDROITIN 500-400 MG TABLET    Take 1 tablet by mouth daily.     LEVOTHYROXINE (SYNTHROID, LEVOTHROID) 88 MCG TABLET    TAKE 1 TABLET BY MOUTH DAILY BEFORE BREAKFAST  Modified Medications   No medications on file  Discontinued Medications   PREDNISONE (DELTASONE) 10 MG TABLET    Take 3 tabs on days 1-2, take 2 tabs on days 3-4, take 1 tab on days 5-6

## 2015-09-30 NOTE — Progress Notes (Signed)
Pre visit review using our clinic review tool, if applicable. No additional management support is needed unless otherwise documented below in the visit note. 

## 2015-09-30 NOTE — Patient Instructions (Signed)

## 2015-10-01 LAB — HEPATITIS C ANTIBODY: HCV AB: NEGATIVE

## 2015-10-07 ENCOUNTER — Other Ambulatory Visit: Payer: Self-pay | Admitting: Family Medicine

## 2015-10-07 ENCOUNTER — Ambulatory Visit
Admission: RE | Admit: 2015-10-07 | Discharge: 2015-10-07 | Disposition: A | Discharge status: Home / Self Care | Payer: Medicare Other | Source: Ambulatory Visit | Attending: Family Medicine | Admitting: Family Medicine

## 2015-10-07 DIAGNOSIS — R1011 Right upper quadrant pain: Secondary | ICD-10-CM

## 2015-10-08 ENCOUNTER — Telehealth: Payer: Self-pay

## 2015-10-08 NOTE — Telephone Encounter (Signed)
Pt said she had spoken with Dr Lorelei Pont earlier today and was advised about a hepatic cyst that had previously been aspirated in 2007. Pt cannot remember having a cyst aspirated and pt wants to know when and where the cyst was aspirated. Per 07/08/2006 note in pts chart; pt has U/S guided aspiration of hepatic cyst on 07/08/2006 at [facility]. Pt voiced understanding and will wait on cb from Dr Lorelei Pont. FYI to Dr Lorelei Pont.

## 2015-10-08 NOTE — Telephone Encounter (Signed)
Here is the entire procedure note.   Clinical Data: Enlarging hepatic cyst. The patient presents for aspiration of the cyst which is located in the right lobe of the liver. The cyst has mildly complex features by ultrasound with internal septation.  ULTRASOUND GUIDED HEPATIC CYST ASPIRATION AND DECOMPRESSION:  Comparison: Ultrasound studies from 06/21/06 and 03/01/06.  Procedure: Prior to the procedure, informed consent was obtained. Initial ultrasound was used to localize the hepatic cyst. A right lateral intracostal approach was chosen. Overlying skin was marked and sterilely prepped and draped. Local anesthesia was provided with 1% Lidocaine.  A 5 [place] Yueh centesis catheter was inserted into a hepatic cyst. The path of the needle and catheter were confirmed with direct ultrasound visualization. The catheter was left in place and manual aspiration performed. Collected fluid was sent for cytology. Post procedural ultrasound was performed.  Findings: A well-circumscribed hepatic cyst is present in the lateral aspect of the right lobe measuring 5.8 x 4.4 x 6.5 cm. These are current estimations just prior to the procedure with maximal diameter likely being larger and closer to 7.5 cm based on the prior ultrasound study.  Aspiration yielded 85 cc of clear fluid. Sample was sent for cytology. The procedure resulted in complete collapse and decompression of the hepatic cyst with no further cystic fluid remaining on post procedural ultrasound.  IMPRESSION:  Ultrasound guided aspiration and decompression of hepatic cyst. 85 cc of clear fluid was removed resulting in complete decompression of the cyst. The fluid was sent for cytologic analysis.  Provider: Lamona Curl, [facility]

## 2015-10-09 ENCOUNTER — Other Ambulatory Visit: Payer: Self-pay | Admitting: Family Medicine

## 2015-10-09 DIAGNOSIS — K7689 Other specified diseases of liver: Secondary | ICD-10-CM

## 2015-10-09 DIAGNOSIS — R1011 Right upper quadrant pain: Secondary | ICD-10-CM

## 2015-10-15 ENCOUNTER — Ambulatory Visit (INDEPENDENT_AMBULATORY_CARE_PROVIDER_SITE_OTHER): Payer: Medicare Other | Admitting: Gastroenterology

## 2015-10-15 ENCOUNTER — Encounter: Payer: Self-pay | Admitting: Gastroenterology

## 2015-10-15 VITALS — BP 118/60 | HR 70 | Ht 64.0 in | Wt 157.0 lb

## 2015-10-15 DIAGNOSIS — Z860100 Personal history of colon polyps, unspecified: Secondary | ICD-10-CM | POA: Insufficient documentation

## 2015-10-15 DIAGNOSIS — K7689 Other specified diseases of liver: Secondary | ICD-10-CM | POA: Diagnosis not present

## 2015-10-15 MED ORDER — NA SULFATE-K SULFATE-MG SULF 17.5-3.13-1.6 GM/177ML PO SOLN: 1.0000 | Freq: Once | ORAL | Status: DC

## 2015-10-15 NOTE — Patient Instructions (Addendum)
You have been scheduled for a colonoscopy. Please follow written instructions given to you at your visit today.  Please pick up your prep supplies at the pharmacy within the next 1-3 days. Friend. If you use inhalers (even only as needed), please bring them with you on the day of your procedure. Your physician has requested that you go to www.startemmi.com and enter the access code given to you at your visit today. This web site gives a general overview about your procedure. However, you should still follow specific instructions given to you by our office regarding your preparation for the procedure.           If you are age 72 or older, your body mass index should be between 23-30. Your Body mass index is 26.94 kg/(m^2).

## 2015-10-15 NOTE — Progress Notes (Signed)
Agree with initial assessment and plans. Case discussed with GI PA. She has had history of liver cyst which has recurred post aspiration. This is typical. Currently asymptomatic. Would observe. Could continue repeat aspiration for symptom relief if felt responsible. Does need surveillance colonoscopy. Planned.

## 2015-10-15 NOTE — Progress Notes (Signed)
10/15/2015 Monica Irwin [id number] [birth year]   HISTORY OF PRESENT ILLNESS:  This is a 70 year old female who has been referred here by her PCP, Dr. Lorelei Pont, for evaluation regarding a large liver cyst.  She says that she was having RUQ abdominal pain but by the time she saw her PCP it had resolved.  She underwent abdominal ultrasound and was found to have a stable small gallbladder polyp and large hepatic cyst measuring 12 cm that was causing capsular bulging.  She had this same cyst drained in 2007 and cytology showed contents c/w cyst.  Does not recall whether she had similar pain at that time and does not even recall having it drained.  Once again, at this point she is no longer having any abdominal pain.  She is also due for colonoscopy recall.  Her last colonoscopy was by Dr. Watt Climes in 11/2010 at which time she was found to have internal and external hemorrhoids, diverticulosis throughout the entire colon, end-to-end colo-colonic anastomosis (? Done for diverticular disease) that was biopsied (normal colonic mucosa), and one small polyp in the proximal ascending colon (tubular adenoma).  Denies any bowel complaints including bleeding.  Has family history of colon cancer in a nephew.  Sister had UC for several years then had a colectomy.   Past Medical History  Diagnosis Date  . Hypothyroidism   . Nephrolithiasis   . Osteoarthritis   . History of colonic polyps   . Osteoporosis   . Headache   . SVT (supraventricular tachycardia) ([place]) 05/07/2015  . AVNRT (AV nodal re-entry tachycardia) ([place]) 08/19/2015    S/p successful ablation 07/2015.   Past Surgical History  Procedure Laterality Date  . Back surgery      [phone]  . Partial colectomy  2005  . Thyroidectomy  2008  . Small intestine surgery    . Electrophysiologic study N/A 07/22/2015    Procedure: SVT Ablation;  Surgeon: Will Meredith Leeds, MD;  Location: [facility];  Service: Cardiovascular;  Laterality:  N/A;    reports that she has never smoked. She has never used smokeless tobacco. She reports that she drinks alcohol. She reports that she does not use illicit drugs. family history includes Alzheimer's disease in her mother and sister; Heart attack in her brother; Heart failure in her brother; Stroke in her mother; Ulcerative colitis in her sister. No Known Allergies    Outpatient Encounter Prescriptions as of 10/15/2015  Medication Sig  . aspirin 81 MG tablet Take 81 mg by mouth daily.  . Calcium Carbonate-Vitamin D (CALCIUM + D) 600-200 MG-UNIT TABS Take 1 tablet by mouth daily.   . fluticasone (FLONASE) 50 MCG/ACT nasal spray Place 2 sprays into both nostrils daily as needed for allergies or rhinitis (allergies).  [name] glucosamine-chondroitin 500-400 MG tablet Take 1 tablet by mouth daily.    [name] levothyroxine (SYNTHROID, LEVOTHROID) 88 MCG tablet TAKE 1 TABLET BY MOUTH DAILY BEFORE BREAKFAST  . Na Sulfate-K Sulfate-Mg Sulf SOLN Take 1 kit by mouth once.   No facility-administered encounter medications on file as of 10/15/2015.     REVIEW OF SYSTEMS  : All other systems reviewed and negative except where noted in the History of Present Illness.   PHYSICAL EXAM: BP 118/60 mmHg  Pulse 70  Ht '5\' 4"'  (1.626 m)  Wt 157 lb (71.215 kg)  BMI 26.94 kg/m2 General: Well developed white female in no acute distress Head: Normocephalic and atraumatic Eyes:  Sclerae anicteric, conjunctiva pink.  Ears: Normal auditory acuity Lungs: Clear throughout to auscultation Heart: Regular rate and rhythm Abdomen: Soft, non-distended.  Normal bowel sounds.  Non-tender. Rectal:  Will be done at the time of colonoscopy. Musculoskeletal: Symmetrical with no gross deformities  Skin: No lesions on visible extremities Extremities: No edema  Neurological: Alert oriented x 4, grossly non-focal Psychological:  Alert and cooperative. Normal mood and affect  ASSESSMENT AND PLAN: -Personal history of colon polyps:   Due for colonoscopy recall in May for history of adenomatous polyps in 2012.  Will schedule with Dr. Henrene Pastor.  The risks, benefits, and alternatives to colonoscopy were discussed with the patient and she consents to proceed.  -Liver cyst:  Quite large at 12 cm.  Was having pain but pain resolved even before she had the ultrasound.  Had this drained 10 years ago.  Will hold off with any further drainage for now, but if pain returns then could reconsider.    CC:  Owens Loffler, MD

## 2015-10-30 ENCOUNTER — Encounter: Payer: Self-pay | Admitting: Internal Medicine

## 2015-10-30 ENCOUNTER — Ambulatory Visit (AMBULATORY_SURGERY_CENTER): Payer: Medicare Other | Admitting: Internal Medicine

## 2015-10-30 ENCOUNTER — Other Ambulatory Visit: Payer: Self-pay

## 2015-10-30 ENCOUNTER — Telehealth: Payer: Self-pay | Admitting: Internal Medicine

## 2015-10-30 VITALS — BP 110/68 | HR 55 | Temp 97.5°F | Resp 20 | Ht 64.0 in | Wt 157.0 lb

## 2015-10-30 DIAGNOSIS — D123 Benign neoplasm of transverse colon: Secondary | ICD-10-CM

## 2015-10-30 DIAGNOSIS — Z86010 Personal history of colonic polyps: Secondary | ICD-10-CM

## 2015-10-30 DIAGNOSIS — D122 Benign neoplasm of ascending colon: Secondary | ICD-10-CM

## 2015-10-30 DIAGNOSIS — K635 Polyp of colon: Secondary | ICD-10-CM | POA: Diagnosis not present

## 2015-10-30 DIAGNOSIS — K7689 Other specified diseases of liver: Secondary | ICD-10-CM

## 2015-10-30 MED ORDER — SODIUM CHLORIDE 0.9 % IV SOLN: 500.0000 mL | INTRAVENOUS | Status: DC

## 2015-10-30 NOTE — Progress Notes (Signed)
Patient awakening,vss,report to rn 

## 2015-10-30 NOTE — Progress Notes (Signed)
Called to room to assist during endoscopic procedure.  Patient ID and intended procedure confirmed with present staff. Received instructions for my participation in the procedure from the performing physician.  

## 2015-10-30 NOTE — Telephone Encounter (Signed)
Orders entered in epic for [place] asp and decompression. Spoke with IR to make sure orders in epic are correct. IR will contact the pt to schedule the appt.

## 2015-10-30 NOTE — Patient Instructions (Addendum)

## 2015-10-30 NOTE — Telephone Encounter (Signed)
Monica Irwin, The patient was seen in the office recently by the physician assistant for a large chronic hepatic cyst. This was Identified on ultrasound for complaints of right upper quadrant pain which had resolved at the time of Monica Irwin GI evaluation. We elected to watch this unless she had recurrent pain. I saw Monica Irwin today for routine surveillance colonoscopy and she reports to me significant (8 out of 10) recurrent issues with pain lasting up to several days. She is interested in having aspiration and decompression of Monica Irwin hepatic cyst as she did 10 years ago. As such, please arrange "ultrasound-guided aspiration and decompression of large symptomatic hepatic cyst". Please have the fluid sent for cell count with differential and cytology. Thank you. Dr. Henrene Pastor

## 2015-10-30 NOTE — Op Note (Signed)
[facility] Patient Name: Monica Irwin Procedure Date: 10/30/2015 1:50 PM MRN: [id number] Endoscopist: Docia Chuck. Henrene Pastor , MD Age: 70 Date of Birth: [birth year] Gender: Female Procedure:                Colonoscopy, with cold snare polypectomy x 3 Indications:              High risk colon cancer surveillance: Personal                            history of non-advanced adenoma. Previous                            examination May 2012, Dr.Magod Medicines:                Monitored Anesthesia Care Procedure:                Pre-Anesthesia Assessment:                           - Prior to the procedure, a History and Physical                            was performed, and patient medications and                            allergies were reviewed. The patient's tolerance of                            previous anesthesia was also reviewed. The risks                            and benefits of the procedure and the sedation                            options and risks were discussed with the patient.                            All questions were answered, and informed consent                            was obtained. Prior Anticoagulants: The patient has                            taken no previous anticoagulant or antiplatelet                            agents. ASA Grade Assessment: II - A patient with                            mild systemic disease. After reviewing the risks                            and benefits, the patient was deemed in  satisfactory condition to undergo the procedure.                           After obtaining informed consent, the colonoscope                            was passed under direct vision. Throughout the                            procedure, the patient's blood pressure, pulse, and                            oxygen saturations were monitored continuously. The                            Model CF-HQ190L [id number]) scope was  introduced                            through the anus and advanced to the the cecum,                            identified by appendiceal orifice and ileocecal                            valve. The ileocecal valve, appendiceal orifice,                            and rectum were photographed. The quality of the                            bowel preparation was good. The colonoscopy was                            performed without difficulty. The patient tolerated                            the procedure well. The bowel preparation used was                            SUPREP. Scope In: 2:03:48 PM Scope Out: 2:16:45 PM Scope Withdrawal Time: 0 hours 9 minutes 37 seconds  Total Procedure Duration: 0 hours 12 minutes 57 seconds  Findings:                 Three polyps were found in the transverse colon and                            ascending colon. The polyps were 2 to 4 mm in size.                            These polyps were removed with a cold snare.                            Resection and retrieval were complete.  Multiple small and large-mouthed diverticula were                            found in the entire colon. There was a surgical                            anastomosis at approximately 20 cm which was widely                            patent.                           The exam was otherwise without abnormality on                            direct and retroflexion views. Complications:            No immediate complications. Estimated blood loss:                            None. Estimated Blood Loss:     Estimated blood loss: none. Impression:               - Three 2 to 4 mm polyps in the transverse colon                            and in the ascending colon, removed with a cold                            snare. Resected and retrieved.                           - Diverticulosis in the entire examined colon.                            Surgical anastomosis at  20 cm..                           - The examination was otherwise normal on direct                            and retroflexion views. Recommendation:           - Repeat colonoscopy in 5 years for surveillance.                           - Patient has a contact number available for                            emergencies. The signs and symptoms of potential                            delayed complications were discussed with the                            patient. Return to normal  activities tomorrow.                            Written discharge instructions were provided to the                            patient.                           - Resume previous diet.                           - Continue present medications.                           - Await pathology results. Docia Chuck. Henrene Pastor, MD 10/30/2015 2:29:05 PM This report has been signed electronically. CC Letter to:             Spencer T. Copland, MD

## 2015-10-31 ENCOUNTER — Telehealth: Payer: Self-pay | Admitting: *Deleted

## 2015-10-31 NOTE — Telephone Encounter (Signed)
Pt scheduled for 11/11/15@1pm .

## 2015-10-31 NOTE — Telephone Encounter (Signed)
  Follow up Call-  Call back number 10/30/2015  Post procedure Call Back phone  # [phone]  Permission to leave phone message Yes     Patient questions:  Do you have a fever, pain , or abdominal swelling? No. Pain Score  0 *  Have you tolerated food without any problems? Yes.    Have you been able to return to your normal activities? Yes.    Do you have any questions about your discharge instructions: Diet   No. Medications  No. Follow up visit  No.  Do you have questions or concerns about your Care? No.  Actions: * If pain score is 4 or above: No action needed, pain <4.

## 2015-11-05 ENCOUNTER — Encounter: Payer: Self-pay | Admitting: Internal Medicine

## 2015-11-08 ENCOUNTER — Other Ambulatory Visit: Payer: Self-pay | Admitting: Radiology

## 2015-11-11 ENCOUNTER — Ambulatory Visit (HOSPITAL_COMMUNITY)
Admission: RE | Admit: 2015-11-11 | Discharge: 2015-11-11 | Disposition: A | Discharge status: Home / Self Care | Payer: Medicare Other | Source: Ambulatory Visit | Attending: Internal Medicine | Admitting: Internal Medicine

## 2015-11-11 ENCOUNTER — Encounter (HOSPITAL_COMMUNITY): Payer: Self-pay

## 2015-11-11 DIAGNOSIS — Z79899 Other long term (current) drug therapy: Secondary | ICD-10-CM | POA: Insufficient documentation

## 2015-11-11 DIAGNOSIS — K7689 Other specified diseases of liver: Secondary | ICD-10-CM | POA: Diagnosis not present

## 2015-11-11 DIAGNOSIS — R1011 Right upper quadrant pain: Secondary | ICD-10-CM | POA: Diagnosis not present

## 2015-11-11 DIAGNOSIS — I471 Supraventricular tachycardia: Secondary | ICD-10-CM | POA: Diagnosis not present

## 2015-11-11 DIAGNOSIS — Z7982 Long term (current) use of aspirin: Secondary | ICD-10-CM | POA: Insufficient documentation

## 2015-11-11 DIAGNOSIS — Z9889 Other specified postprocedural states: Secondary | ICD-10-CM | POA: Insufficient documentation

## 2015-11-11 DIAGNOSIS — Z87442 Personal history of urinary calculi: Secondary | ICD-10-CM | POA: Diagnosis not present

## 2015-11-11 DIAGNOSIS — Z6826 Body mass index (BMI) 26.0-26.9, adult: Secondary | ICD-10-CM | POA: Insufficient documentation

## 2015-11-11 DIAGNOSIS — M1990 Unspecified osteoarthritis, unspecified site: Secondary | ICD-10-CM | POA: Insufficient documentation

## 2015-11-11 DIAGNOSIS — Z86010 Personal history of colonic polyps: Secondary | ICD-10-CM | POA: Insufficient documentation

## 2015-11-11 DIAGNOSIS — E039 Hypothyroidism, unspecified: Secondary | ICD-10-CM | POA: Diagnosis not present

## 2015-11-11 LAB — CBC WITH DIFFERENTIAL/PLATELET
Basophils Absolute: 0 10*3/uL (ref 0.0–0.1)
Basophils Relative: 0 %
Eosinophils Absolute: 0.1 10*3/uL (ref 0.0–0.7)
Eosinophils Relative: 1 %
HEMATOCRIT: 41.3 % (ref 36.0–46.0)
HEMOGLOBIN: 14.1 g/dL (ref 12.0–15.0)
LYMPHS ABS: 1.5 10*3/uL (ref 0.7–4.0)
LYMPHS PCT: 29 %
MCH: 30.6 pg (ref 26.0–34.0)
MCHC: 34.1 g/dL (ref 30.0–36.0)
MCV: 89.6 fL (ref 78.0–100.0)
MONOS PCT: 9 %
Monocytes Absolute: 0.5 10*3/uL (ref 0.1–1.0)
NEUTROS ABS: 3.1 10*3/uL (ref 1.7–7.7)
NEUTROS PCT: 61 %
PLATELETS: 211 10*3/uL (ref 150–400)
RBC: 4.61 MIL/uL (ref 3.87–5.11)
RDW: 13.2 % (ref 11.5–15.5)
WBC: 5.1 10*3/uL (ref 4.0–10.5)

## 2015-11-11 LAB — COMPREHENSIVE METABOLIC PANEL
ALBUMIN: 3.8 g/dL (ref 3.5–5.0)
ALT: 25 U/L (ref 14–54)
AST: 21 U/L (ref 15–41)
Alkaline Phosphatase: 82 U/L (ref 38–126)
Anion gap: 9 (ref 5–15)
BUN: 23 mg/dL — ABNORMAL HIGH (ref 6–20)
CHLORIDE: 107 mmol/L (ref 101–111)
CO2: 23 mmol/L (ref 22–32)
CREATININE: 0.74 mg/dL (ref 0.44–1.00)
Calcium: 9 mg/dL (ref 8.9–10.3)
GFR calc non Af Amer: >60 mL/min (ref >60)
GLUCOSE: 98 mg/dL (ref 65–99)
Potassium: 3.8 mmol/L (ref 3.5–5.1)
SODIUM: 139 mmol/L (ref 135–145)
Total Bilirubin: 0.5 mg/dL (ref 0.3–1.2)
Total Protein: 6.3 g/dL — ABNORMAL LOW (ref 6.5–8.1)

## 2015-11-11 LAB — PROTIME-INR
INR: 1.01 (ref 0.00–1.49)
Prothrombin Time: 13.5 seconds (ref 11.6–15.2)

## 2015-11-11 LAB — BODY FLUID CELL COUNT WITH DIFFERENTIAL: Total Nucleated Cell Count, Fluid: 800000 cu mm — ABNORMAL HIGH (ref 0–1000)

## 2015-11-11 MED ORDER — MIDAZOLAM HCL 2 MG/2ML IJ SOLN
INTRAMUSCULAR | Status: AC | PRN
  Administered 2015-11-11 (×2): 1 mg via INTRAVENOUS

## 2015-11-11 MED ORDER — FENTANYL CITRATE (PF) 100 MCG/2ML IJ SOLN
INTRAMUSCULAR | Status: AC
  Filled 2015-11-11: qty 4

## 2015-11-11 MED ORDER — MIDAZOLAM HCL 2 MG/2ML IJ SOLN
INTRAMUSCULAR | Status: AC
Start: 2015-11-11 — End: 2015-11-11
  Filled 2015-11-11: qty 6

## 2015-11-11 MED ORDER — FENTANYL CITRATE (PF) 100 MCG/2ML IJ SOLN
INTRAMUSCULAR | Status: AC | PRN
  Administered 2015-11-11: 50 ug via INTRAVENOUS

## 2015-11-11 MED ORDER — SODIUM CHLORIDE 0.9 % IV SOLN
INTRAVENOUS | Status: DC
  Administered 2015-11-11: 12:00:00 via INTRAVENOUS

## 2015-11-11 NOTE — Discharge Instructions (Signed)
Liver Biopsy, Care After °These instructions give you information on caring for yourself after your procedure. Your doctor may also give you more specific instructions. Call your doctor if you have any problems or questions after your procedure. °HOME CARE °· Rest at home for 1-2 days or as told by your doctor. °· Have someone stay with you for at least 24 hours. °· Do not do these things in the first 24 hours: °· Drive. °· Use machinery. °· Take care of other people. °· Sign legal documents. °· Take a bath or shower. °· There are many different ways to close and cover a cut (incision). For example, a cut can be closed with stitches, skin glue, or adhesive strips. Follow your doctor's instructions on: °· Taking care of your cut. °· Changing and removing your bandage (dressing). °· Removing whatever was used to close your cut. °· Do not drink alcohol in the first week. °· Do not lift more than 5 pounds or play contact sports for the first 2 weeks. °· Take medicines only as told by your doctor. For 1 week, do not take medicine that has aspirin in it or medicines like ibuprofen. °· Get your test results. °GET HELP IF: °· A cut bleeds and leaves more than just a small spot of blood. °· A cut is red, puffs up (swells), or hurts more than before. °· Fluid or something else comes from a cut. °· A cut smells bad. °· You have a fever or chills. °GET HELP RIGHT AWAY IF: °· You have swelling, bloating, or pain in your belly (abdomen). °· You get dizzy or faint. °· You have a rash. °· You feel sick to your stomach (nauseous) or throw up (vomit). °· You have trouble breathing, feel short of breath, or feel faint. °· Your chest hurts. °· You have problems talking or seeing. °· You have trouble balancing or moving your arms or legs. °  °This information is not intended to replace advice given to you by your health care provider. Make sure you discuss any questions you have with your health care provider. °  °Document Released:  04/07/2008 Document Revised: 07/20/2014 Document Reviewed: 08/25/2013 °Elsevier Interactive Patient Education ©2016 [employer] ° ° °Moderate Conscious Sedation, Adult, Care After °Refer to this sheet in the next few weeks. These instructions provide you with information on caring for yourself after your procedure. Your health care provider may also give you more specific instructions. Your treatment has been planned according to current medical practices, but problems sometimes occur. Call your health care provider if you have any problems or questions after your procedure. °WHAT TO EXPECT AFTER THE PROCEDURE  °After your procedure: °· You may feel sleepy, clumsy, and have poor balance for several hours. °· Vomiting may occur if you eat too soon after the procedure. °HOME CARE INSTRUCTIONS °· Do not participate in any activities where you could become injured for at least 24 hours. Do not: °¨ Drive. °¨ Swim. °¨ Ride a bicycle. °¨ Operate heavy machinery. °¨ Cook. °¨ Use power tools. °¨ Climb ladders. °¨ Work from a high place. °· Do not make important decisions or sign legal documents until you are improved. °· If you vomit, drink water, juice, or soup when you can drink without vomiting. Make sure you have little or no nausea before eating solid foods. °· Only take over-the-counter or prescription medicines for pain, discomfort, or fever as directed by your health care provider. °· Make sure you and your   family fully understand everything about the medicines given to you, including what side effects may occur. °· You should not drink alcohol, take sleeping pills, or take medicines that cause drowsiness for at least 24 hours. °· If you smoke, do not smoke without supervision. °· If you are feeling better, you may resume normal activities 24 hours after you were sedated. °· Keep all appointments with your health care provider. °SEEK MEDICAL CARE IF: °· Your skin is pale or bluish in color. °· You continue to feel  nauseous or vomit. °· Your pain is getting worse and is not helped by medicine. °· You have bleeding or swelling. °· You are still sleepy or feeling clumsy after 24 hours. °SEEK IMMEDIATE MEDICAL CARE IF: °· You develop a rash. °· You have difficulty breathing. °· You develop any type of allergic problem. °· You have a fever. °MAKE SURE YOU: °· Understand these instructions. °· Will watch your condition. °· Will get help right away if you are not doing well or get worse. °  °This information is not intended to replace advice given to you by your health care provider. Make sure you discuss any questions you have with your health care provider. °  °Document Released: 04/19/2013 Document Revised: 07/20/2014 Document Reviewed: 04/19/2013 °Elsevier Interactive Patient Education ©2016 Elsevier Inc. ° °

## 2015-11-11 NOTE — Procedures (Signed)
Interventional Radiology Procedure Note  Procedure:  US guided aspiration/drainage of hepatic cyst  Complications:  None  Estimated Blood Loss: < 10 mL  620 mL of brownish fluid removed.  Cyst shows complete decompression by [place] on completion.  Fluid sent for cell count and cytology.  Venetia Night. Kathlene Cote, M.D Pager:  [phone]

## 2015-11-11 NOTE — H&P (Signed)
Referring Physician(s): [place]  Supervising Physician: Aletta Edouard  Patient Status: OP  Chief Complaint: recurrent symptomatic hepatic cyst  Subjective: Patient familiar to IR service from prior ultrasound guided aspiration/decompression of an enlarging hepatic cyst in 2007 with negative cytology. She presents now with intermittent right upper quadrant abdominal discomfort and follow-up abdominal ultrasound on 10/07/15 which revealed enlargement of the dominant right hepatic lobe cyst now measuring approximately 12 cm and causing capsular bulging. Ultrasound-guided aspiration/decompression of the enlarging hepatic cyst has now been requested again and she presents today for the procedure. She currently denies fever, headache, chest pain, dyspnea, cough, back pain, nausea, vomiting or abnormal bleeding. Additional medical history as listed below. Past Medical History  Diagnosis Date  . Hypothyroidism   . Nephrolithiasis   . Osteoarthritis   . History of colonic polyps   . Osteoporosis   . Headache   . SVT (supraventricular tachycardia) ([place]) 05/07/2015  . 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  . Liver cyst    Past Surgical History  Procedure Laterality Date  . Back surgery      [phone]  . Partial colectomy  2005  . Thyroidectomy  2008  . Small intestine surgery    . Electrophysiologic study N/A 07/22/2015    Procedure: SVT Ablation;  Surgeon: Will Meredith Leeds, MD;  Location: [facility];  Service: Cardiovascular;  Laterality: N/A;      Allergies: Review of patient's allergies indicates no known allergies.  Medications: Prior to Admission medications   Medication Sig Start Date End Date Taking? Authorizing Provider  aspirin 81 MG tablet Take 81 mg by mouth daily.   Yes Historical Provider, MD  Calcium Carbonate-Vitamin D (CALCIUM + D) 600-200 MG-UNIT TABS Take 1 tablet by mouth daily.    Yes  Historical Provider, MD  glucosamine-chondroitin 500-400 MG tablet Take 1 tablet by mouth daily.     Yes Historical Provider, MD  levothyroxine (SYNTHROID, LEVOTHROID) 88 MCG tablet TAKE 1 TABLET BY MOUTH DAILY BEFORE BREAKFAST 02/25/15  Yes Spencer Copland, MD  fluticasone (FLONASE) 50 MCG/ACT nasal spray Place 2 sprays into both nostrils daily as needed for allergies or rhinitis (allergies).    Historical Provider, MD     Vital Signs: BP 136/79 mmHg  Pulse 63  Temp(Src) 97.8 F (36.6 C) (Oral)  Resp 18  Ht 5\' 4"  (1.626 m)  Wt 154 lb (69.854 kg)  BMI 26.42 kg/m2  SpO2 100%  Physical Exam patient awake, alert. Chest clear to auscultation bilaterally. Heart with regular rate and rhythm. Abd soft, positive bowel sounds, nontender at present. Lower extremities with no edema.  Imaging: No results found.  Labs:  CBC:  Recent Labs  04/17/15 1256 07/16/15 1228 09/30/15 1319 11/11/15 1150  WBC 8.3 7.2 5.8 5.1  HGB 15.2* 14.2 14.8 14.1  HCT 45.6 41.6 43.9 41.3  PLT 222 218 264.0 211    COAGS: No results for input(s): INR, APTT in the last 8760 hours.  BMP:  Recent Labs  02/08/15 0821 04/17/15 1256 07/16/15 1228 09/30/15 1319  NA 141 136 141 139  K 4.3 4.3 3.9 4.5  CL 106 101 103 103  CO2 29 28 27 30   GLUCOSE 93 117* 89 94  BUN 24* 23* 26* 22  CALCIUM 9.3 9.1 9.2 9.4  CREATININE 0.82 0.93 0.79 0.82  GFRNONAA  --  >60  --   --   GFRAA  --  >60  --   --     LIVER FUNCTION TESTS:  Recent Labs  02/08/15 0821 09/30/15 1319  BILITOT 0.5 0.3  AST 18 24  ALT 19 31  ALKPHOS 68 85  PROT 6.6 6.5  ALBUMIN 4.1 4.0    Assessment and Plan: Patient with history of symptomatic chronic right hepatic lobe cyst, last aspirated in 2007. She now presents with recurrent intermittent right upper quadrant  abdominal pain and abdominal ultrasound revealing enlargement of the dominant right hepatic lobe cyst currently measuring 12 cm and causing capsular bulging. She presents again today for ultrasound-guided aspiration/decompression of the hepatic cyst. Details/risks of procedure, including but not limited to, internal bleeding, infection, injury to adjacent organs, discussed with patient/daughter with their understanding and consent.   Electronically Signed: D. Rowe Robert 11/11/2015, 11:57 AM   I spent a total of 20 minutes at the the patient's bedside AND on the patient's hospital floor or unit, greater than 50% of which was counseling/coordinating care for US guided aspiration/decompression of hepatic cyst

## 2015-11-22 ENCOUNTER — Encounter: Payer: Medicare Other | Admitting: Internal Medicine

## 2015-12-13 ENCOUNTER — Telehealth: Payer: Self-pay | Admitting: Family Medicine

## 2015-12-13 NOTE — Telephone Encounter (Signed)
PLEASE NOTE: All timestamps contained within this report are represented as [place] Standard Time. CONFIDENTIALTY NOTICE: This fax transmission is intended only for the addressee. It contains information that is legally privileged, confidential or otherwise protected from use or disclosure. If you are not the intended recipient, you are strictly prohibited from reviewing, disclosing, copying using or disseminating any of this information or taking any action in reliance on or regarding this information. If you have received this fax in error, please notify us immediately by telephone so that we can arrange for its return to [place]. Phone: [phone], Toll-Free: [phone], Fax: [phone] Page: 1 of 1 Call Id: [id number] [place] Patient Name: Monica Irwin DOB: 11-16-1945 Initial Comment Can't control kidney, feels like she is full but when she urinates two tinkles fall out Nurse Assessment Nurse: Dimas Chyle, RN, Dellis Filbert Date/Time Eilene Ghazi Time): 12/13/2015 2:48:30 PM Confirm and document reason for call. If symptomatic, describe symptoms. You must click the next button to save text entered. ---Can't control kidney, feels like she is full but when she urinates two tinkles fall out. Urinated this morning. No fever. No flank pain. Has the patient traveled out of the country within the last 30 days? ---No Does the patient have any new or worsening symptoms? ---Yes Will a triage be completed? ---Yes Related visit to physician within the last 2 weeks? ---No Does the PT have any chronic conditions? (i.e. diabetes, asthma, etc.) ---No Is this a behavioral health or substance abuse call? ---No Guidelines Guideline Title Affirmed Question Affirmed Notes Urinary Symptoms Urinating more frequently than usual (i.e., frequency) Final Disposition User See Physician within 24 Hours Dimas Chyle, RN,  Dellis Filbert Comments Patient has appointment with PCP on Monday. Refused to go to UC and no appointments available for today at [facility] office. Referrals GO TO FACILITY REFUSED Disagree/Comply: Comply

## 2015-12-13 NOTE — Telephone Encounter (Signed)
Pt has appt with Dr Diona Browner on 12/16/15 at 9:15.

## 2015-12-16 ENCOUNTER — Ambulatory Visit (INDEPENDENT_AMBULATORY_CARE_PROVIDER_SITE_OTHER): Payer: Medicare Other | Admitting: Family Medicine

## 2015-12-16 ENCOUNTER — Encounter: Payer: Self-pay | Admitting: Family Medicine

## 2015-12-16 VITALS — BP 128/62 | HR 82 | Temp 98.3°F | Ht 64.0 in | Wt 157.5 lb

## 2015-12-16 DIAGNOSIS — R3915 Urgency of urination: Secondary | ICD-10-CM

## 2015-12-16 DIAGNOSIS — N10 Acute tubulo-interstitial nephritis: Secondary | ICD-10-CM | POA: Diagnosis not present

## 2015-12-16 LAB — POC URINALSYSI DIPSTICK (AUTOMATED)
BILIRUBIN UA: NEGATIVE
Glucose, UA: NEGATIVE
KETONES UA: NEGATIVE
NITRITE UA: POSITIVE
PH UA: 6
Spec Grav, UA: >=1.03
Urobilinogen, UA: 0.2

## 2015-12-16 MED ORDER — CIPROFLOXACIN HCL 500 MG PO TABS: 500.0000 mg | ORAL_TABLET | Freq: Two times a day (BID) | ORAL | Status: DC

## 2015-12-16 NOTE — Progress Notes (Signed)
   Subjective:    Patient ID: Monica Irwin, female    DOB: 1945/09/06, 70 y.o.   MRN: [id number]  Flank Pain This is a new problem. The current episode started 1 to 4 weeks ago. The problem has been rapidly worsening (worse in last 3-4 days) since onset. Pain location: right CVA. The quality of the pain is described as aching. The pain does not radiate. The pain is moderate. Associated symptoms include bladder incontinence and dysuria. Pertinent negatives include no abdominal pain, bowel incontinence, fever, leg pain, numbness, paresthesias, tingling or weakness. (Dribbling with urination, frequency, urgency presents as well, no hematuria.) She has tried nothing for the symptoms. The treatment provided no relief.   Social History /Family History/Past Medical History reviewed and updated if needed.  Has never had UTI in past.   Review of Systems  Constitutional: Negative for fever.  Gastrointestinal: Negative for abdominal pain and bowel incontinence.  Genitourinary: Positive for bladder incontinence, dysuria and flank pain.  Neurological: Negative for tingling, weakness, numbness and paresthesias.       Objective:   Physical Exam  Constitutional: Vital signs are normal. She appears well-developed and well-nourished. She is cooperative.  Non-toxic appearance. She does not appear ill. No distress.  HENT:  Head: Normocephalic.  Right Ear: Hearing, tympanic membrane, external ear and ear canal normal. Tympanic membrane is not erythematous, not retracted and not bulging.  Left Ear: Hearing, tympanic membrane, external ear and ear canal normal. Tympanic membrane is not erythematous, not retracted and not bulging.  Nose: No mucosal edema or rhinorrhea. Right sinus exhibits no maxillary sinus tenderness and no frontal sinus tenderness. Left sinus exhibits no maxillary sinus tenderness and no frontal sinus tenderness.  Mouth/Throat: Uvula is midline, oropharynx is clear and moist and mucous  membranes are normal.  Eyes: Conjunctivae, EOM and lids are normal. Pupils are equal, round, and reactive to light. Lids are everted and swept, no foreign bodies found.  Neck: Trachea normal and normal range of motion. Neck supple. Carotid bruit is not present. No thyroid mass and no thyromegaly present.  Cardiovascular: Normal rate, regular rhythm, S1 normal, S2 normal, normal heart sounds, intact distal pulses and normal pulses.  Exam reveals no gallop and no friction rub.   No murmur heard. Pulmonary/Chest: Effort normal and breath sounds normal. No tachypnea. No respiratory distress. She has no decreased breath sounds. She has no wheezes. She has no rhonchi. She has no rales.  Abdominal: Soft. Normal appearance and bowel sounds are normal. There is no hepatosplenomegaly. There is no tenderness. There is CVA tenderness.  Neurological: She is alert.  Skin: Skin is warm, dry and intact. No rash noted.  Psychiatric: Her speech is normal and behavior is normal. Judgment and thought content normal. Her mood appears not anxious. Cognition and memory are normal. She does not exhibit a depressed mood.          Assessment & Plan:

## 2015-12-16 NOTE — Addendum Note (Signed)
Addended by: Carter Kitten on: 12/16/2015 10:50 AM   Modules accepted: Orders, SmartSet

## 2015-12-16 NOTE — Patient Instructions (Signed)
Complete a course of antibiotics.  Push fluids, continue cranberry.   Call if fever on antibiotics.

## 2015-12-16 NOTE — Progress Notes (Signed)
Pre visit review using our clinic review tool, if applicable. No additional management support is needed unless otherwise documented below in the visit note. 

## 2015-12-16 NOTE — Assessment & Plan Note (Signed)
Treat with 7 day course of higher dose cipro given symptoms of kidney involvement.  Pt will try to given additional sample for culture, but only small amount collected initially.  Push fluids.

## 2015-12-18 LAB — URINE CULTURE

## 2016-01-25 ENCOUNTER — Telehealth: Payer: Self-pay | Admitting: Family Medicine

## 2016-01-25 NOTE — Telephone Encounter (Signed)
LM for pt to sch CPE and AWV, mn °

## 2016-01-29 ENCOUNTER — Other Ambulatory Visit: Payer: Self-pay | Admitting: Family Medicine

## 2016-03-03 ENCOUNTER — Other Ambulatory Visit: Payer: Self-pay | Admitting: Family Medicine

## 2016-03-10 ENCOUNTER — Telehealth: Payer: Self-pay | Admitting: Family Medicine

## 2016-03-10 NOTE — Telephone Encounter (Signed)
I spoke with Monica Irwin and r/s her for 9/18 @ 10:30am. Thanks.

## 2016-03-10 NOTE — Telephone Encounter (Signed)
Pt came in requesting to change her appt for cpe scheduled [id number] due to a conflict that came up. She said she is available the following wk or the wk before. The next cpe is 12/17 but she doesn't want to [facility] that long. Can I get her scheduled earlier?

## 2016-03-10 NOTE — Telephone Encounter (Signed)
Please put her in a first available 30 minute slot.

## 2016-03-11 ENCOUNTER — Other Ambulatory Visit: Payer: Self-pay | Admitting: Family Medicine

## 2016-03-11 DIAGNOSIS — R5383 Other fatigue: Secondary | ICD-10-CM

## 2016-03-11 DIAGNOSIS — E039 Hypothyroidism, unspecified: Secondary | ICD-10-CM

## 2016-03-11 DIAGNOSIS — E785 Hyperlipidemia, unspecified: Secondary | ICD-10-CM

## 2016-03-13 ENCOUNTER — Other Ambulatory Visit: Payer: Self-pay | Admitting: Family Medicine

## 2016-03-13 DIAGNOSIS — R5383 Other fatigue: Secondary | ICD-10-CM

## 2016-03-13 DIAGNOSIS — E039 Hypothyroidism, unspecified: Secondary | ICD-10-CM

## 2016-03-13 DIAGNOSIS — E785 Hyperlipidemia, unspecified: Secondary | ICD-10-CM

## 2016-03-18 ENCOUNTER — Other Ambulatory Visit (INDEPENDENT_AMBULATORY_CARE_PROVIDER_SITE_OTHER): Payer: Medicare Other

## 2016-03-18 DIAGNOSIS — E785 Hyperlipidemia, unspecified: Secondary | ICD-10-CM | POA: Diagnosis not present

## 2016-03-18 DIAGNOSIS — E039 Hypothyroidism, unspecified: Secondary | ICD-10-CM | POA: Diagnosis not present

## 2016-03-18 DIAGNOSIS — R5383 Other fatigue: Secondary | ICD-10-CM | POA: Diagnosis not present

## 2016-03-18 LAB — CBC WITH DIFFERENTIAL/PLATELET
BASOS ABS: 0 10*3/uL (ref 0.0–0.1)
Basophils Relative: 0.6 % (ref 0.0–3.0)
EOS ABS: 0.1 10*3/uL (ref 0.0–0.7)
Eosinophils Relative: 1.9 % (ref 0.0–5.0)
HEMATOCRIT: 43.5 % (ref 36.0–46.0)
Hemoglobin: 14.8 g/dL (ref 12.0–15.0)
LYMPHS PCT: 18.8 % (ref 12.0–46.0)
Lymphs Abs: 0.8 10*3/uL (ref 0.7–4.0)
MCHC: 34 g/dL (ref 30.0–36.0)
MCV: 93.1 fl (ref 78.0–100.0)
MONO ABS: 0.3 10*3/uL (ref 0.1–1.0)
Monocytes Relative: 7.6 % (ref 3.0–12.0)
NEUTROS ABS: 3.2 10*3/uL (ref 1.4–7.7)
NEUTROS PCT: 71.1 % (ref 43.0–77.0)
PLATELETS: 211 10*3/uL (ref 150.0–400.0)
RBC: 4.67 Mil/uL (ref 3.87–5.11)
RDW: 13.4 % (ref 11.5–15.5)
WBC: 4.4 10*3/uL (ref 4.0–10.5)

## 2016-03-18 LAB — BASIC METABOLIC PANEL
BUN: 19 mg/dL (ref 6–23)
CALCIUM: 8.9 mg/dL (ref 8.4–10.5)
CO2: 32 meq/L (ref 19–32)
CREATININE: 0.73 mg/dL (ref 0.40–1.20)
Chloride: 105 mEq/L (ref 96–112)
GFR: 83.22 mL/min (ref >60.00)
Glucose, Bld: 99 mg/dL (ref 70–99)
Potassium: 5 mEq/L (ref 3.5–5.1)
Sodium: 142 mEq/L (ref 135–145)

## 2016-03-18 LAB — LIPID PANEL
Cholesterol: 200 mg/dL (ref 0–200)
HDL: 52.3 mg/dL (ref >39.00)
LDL Cholesterol: 127 mg/dL — ABNORMAL HIGH (ref 0–99)
NonHDL: 147.83
TRIGLYCERIDES: 103 mg/dL (ref 0.0–149.0)
Total CHOL/HDL Ratio: 4
VLDL: 20.6 mg/dL (ref 0.0–40.0)

## 2016-03-18 LAB — T4, FREE: FREE T4: 1.14 ng/dL (ref 0.60–1.60)

## 2016-03-18 LAB — HEPATIC FUNCTION PANEL
ALBUMIN: 3.8 g/dL (ref 3.5–5.2)
ALK PHOS: 76 U/L (ref 39–117)
ALT: 21 U/L (ref 0–35)
AST: 19 U/L (ref 0–37)
BILIRUBIN DIRECT: 0.1 mg/dL (ref 0.0–0.3)
TOTAL PROTEIN: 6 g/dL (ref 6.0–8.3)
Total Bilirubin: 0.4 mg/dL (ref 0.2–1.2)

## 2016-03-18 LAB — T3, FREE: T3 FREE: 2.7 pg/mL (ref 2.3–4.2)

## 2016-03-18 LAB — TSH: TSH: 0.8 u[IU]/mL (ref 0.35–4.50)

## 2016-03-23 ENCOUNTER — Encounter: Payer: Medicare Other | Admitting: Family Medicine

## 2016-03-30 ENCOUNTER — Ambulatory Visit (INDEPENDENT_AMBULATORY_CARE_PROVIDER_SITE_OTHER): Payer: Medicare Other | Admitting: Family Medicine

## 2016-03-30 ENCOUNTER — Encounter: Payer: Self-pay | Admitting: Family Medicine

## 2016-03-30 VITALS — BP 107/59 | HR 73 | Temp 98.2°F | Ht 63.5 in | Wt 159.8 lb

## 2016-03-30 DIAGNOSIS — R3911 Hesitancy of micturition: Secondary | ICD-10-CM | POA: Diagnosis not present

## 2016-03-30 DIAGNOSIS — Z0001 Encounter for general adult medical examination with abnormal findings: Secondary | ICD-10-CM | POA: Diagnosis not present

## 2016-03-30 DIAGNOSIS — Z23 Encounter for immunization: Secondary | ICD-10-CM

## 2016-03-30 DIAGNOSIS — Z0000 Encounter for general adult medical examination without abnormal findings: Secondary | ICD-10-CM

## 2016-03-30 LAB — POC URINALSYSI DIPSTICK (AUTOMATED)
BILIRUBIN UA: NEGATIVE
Blood, UA: NEGATIVE
GLUCOSE UA: NEGATIVE
KETONES UA: NEGATIVE
Leukocytes, UA: NEGATIVE
Nitrite, UA: NEGATIVE
PROTEIN UA: NEGATIVE
Urobilinogen, UA: 0.2
pH, UA: 5.5

## 2016-03-30 NOTE — Progress Notes (Signed)
Pre visit review using our clinic review tool, if applicable. No additional management support is needed unless otherwise documented below in the visit note. 

## 2016-03-30 NOTE — Progress Notes (Signed)
Dr. Frederico Hamman T. Camyla Camposano, MD, [facility] [place], [address] Phone: [phone] Fax: [phone]  03/30/2016  Patient: Monica Irwin, MRN: [id number], DOB: [birth year], 70 y.o.  Primary Physician:  Owens Loffler, MD   Chief Complaint  Patient presents with  . Medicare Wellness   Subjective:   Monica Irwin is a 70 y.o. pleasant patient who presents for a medicare wellness examination:  Health Maintenance Summary Reviewed and updated, unless pt declines services.  Tobacco History Reviewed. Non-smoker Alcohol: No concerns, no excessive use Exercise Habits: Some activity, rec at least 30 mins 5 times a week STD concerns: none Drug Use: None Birth control method: n/a Menses regular: n/a Lumps or breast concerns: no Breast Cancer Family History: no  Overall doing well and feeling well.  Health Maintenance  Topic Date Due  . TETANUS/TDAP  07/14/2011  . MAMMOGRAM  09/15/2017  . COLONOSCOPY  10/29/2020  . INFLUENZA VACCINE  Completed  . DEXA SCAN  Completed  . ZOSTAVAX  Completed  . Hepatitis C Screening  Completed  . PNA vac Low Risk Adult  Completed   Immunization History  Administered Date(s) Administered  . Influenza Whole 04/10/2009, 03/13/2010  . Influenza,inj,Quad PF,36+ Mos 04/22/2015, 03/30/2016  . Influenza-Unspecified 04/12/2013  . Pneumococcal Conjugate-13 11/08/2013  . Pneumococcal Polysaccharide-23 05/22/2010  . Td 07/13/2001  . Zoster 07/13/2001    Patient Active Problem List   Diagnosis Date Noted  . History of colonic polyps 10/15/2015  . Liver cyst 10/15/2015  . AVNRT (AV nodal re-entry tachycardia) ([place]) 08/19/2015  . Aortic calcification ([place]) 04/22/2015  . Major depressive disorder, recurrent episode, in full remission ([place]) 07/24/2010  . OSTEOPOROSIS 07/24/2010  . INSOMNIA 08/29/2009  . HYPERLIPIDEMIA 12/19/2008  . HYPOTHYROIDISM 07/02/2008  . OSTEOARTHRITIS  07/02/2008  . NEPHROLITHIASIS, HX OF 07/02/2008   Past Medical History:  Diagnosis Date  . AVNRT (AV nodal re-entry tachycardia) ([place]) 08/19/2015   S/p successful ablation 07/2015.  [name] Headache   . History of colonic polyps   . Hypothyroidism   . Liver cyst   . Nephrolithiasis   . Osteoarthritis   . Osteoporosis   . SVT (supraventricular tachycardia) ([place]) 05/07/2015   Past Surgical History:  Procedure Laterality Date  . BACK SURGERY     [phone]  . ELECTROPHYSIOLOGIC STUDY N/A 07/22/2015   Procedure: SVT Ablation;  Surgeon: Will Meredith Leeds, MD;  Location: [facility];  Service: Cardiovascular;  Laterality: N/A;  . PARTIAL COLECTOMY  2005  . SMALL INTESTINE SURGERY    . THYROIDECTOMY  2008   Social History   Social History  . Marital status: Divorced    Spouse name: N/A  . Number of children: 1  . Years of education: N/A   Occupational History  . retired [employer] Retired   Social History Main Topics  . Smoking status: Never Smoker  . Smokeless tobacco: Never Used  . Alcohol use 0.0 oz/week     Comment: rarely  . Drug use: No  . Sexual activity: No   Other Topics Concern  . Not on file   Social History Narrative   Regular exercise--no            Epworth Sleepiness Scale      Total: 4                  Family History  Problem Relation Age of Onset  . Stroke Mother   .  Alzheimer's disease Mother   . Heart failure Brother   . Alzheimer's disease Sister   . Heart attack Brother   . Colon cancer      nephew  . Ulcerative colitis Sister     colon removed   No Known Allergies  Medication list has been reviewed and updated.   General: Denies fever, chills, sweats. No significant weight loss. Eyes: Denies blurring,significant itching ENT: Denies earache, sore throat, and hoarseness.  Cardiovascular: Denies chest pains, palpitations, dyspnea on exertion,  Respiratory: Denies cough, dyspnea at  rest,wheeezing Breast: no concerns about lumps GI: Denies nausea, vomiting, diarrhea, constipation, change in bowel habits, abdominal pain, melena, hematochezia GU: HESITANCY Musculoskeletal: Denies back pain, joint pain Derm: Denies rash, itching Neuro: Denies  paresthesias, frequent falls, frequent headaches Psych: Denies depression, anxiety Endocrine: Denies cold intolerance, heat intolerance, polydipsia Heme: Denies enlarged lymph nodes Allergy: No hayfever  Objective:   BP (!) 107/59   Pulse 73   Temp 98.2 F (36.8 C) (Oral)   Ht 5' 3.5" (1.613 m)   Wt 159 lb 12 oz (72.5 kg)   BMI 27.85 kg/m   The patient completed a fall screen and PHQ-2 and PHQ-9 if necessary, which is documented in the EHR. The CMA/LPN/RN who assisted the patient verbally completed with them and documented results in [facility] EHR.   Hearing Screening   Method: Audiometry   '125Hz'  '250Hz'  '500Hz'  '1000Hz'  '2000Hz'  '3000Hz'  '4000Hz'  '6000Hz'  '8000Hz'   Right ear:   40 40 20  0    Left ear:   0 0 40  0    Vision Screening Comments: Eye Exam with Dr. Satira Sark at [facility] 10/2015.  GEN: well developed, well nourished, no acute distress Eyes: conjunctiva and lids normal, PERRLA, EOMI ENT: TM clear, nares clear, oral exam WNL Neck: supple, no lymphadenopathy, no thyromegaly, no JVD Pulm: clear to auscultation and percussion, respiratory effort normal CV: regular rate and rhythm, S1-S2, no murmur, rub or gallop, no bruits Chest: no scars, masses, no lumps BREAST: breast exam declined GI: soft, non-tender; no hepatosplenomegaly, masses; active bowel sounds all quadrants GU: GU exam declined Lymph: no cervical, axillary or inguinal adenopathy MSK: gait normal, muscle tone and strength WNL, no joint swelling, effusions, discoloration, crepitus  SKIN: clear, good turgor, color WNL, no rashes, lesions, or ulcerations Neuro: normal mental status, normal strength, sensation, and motion Psych: alert; oriented  to person, place and time, normally interactive and not anxious or depressed in appearance.   All labs reviewed with patient. Lipids:    Component Value Date/Time   CHOL 200 03/18/2016 0926   TRIG 103.0 03/18/2016 0926   HDL 52.30 03/18/2016 0926   LDLDIRECT 147.5 03/02/2012 0912   VLDL 20.6 03/18/2016 0926   CHOLHDL 4 03/18/2016 0926   CBC: CBC Latest Ref Rng & Units 03/18/2016 11/11/2015 09/30/2015  WBC 4.0 - 10.5 K/uL 4.4 5.1 5.8  Hemoglobin 12.0 - 15.0 g/dL 14.8 14.1 14.8  Hematocrit 36.0 - 46.0 % 43.5 41.3 43.9  Platelets 150.0 - 400.0 K/uL 211.0 211 756.4    Basic Metabolic Panel:    Component Value Date/Time   NA 142 03/18/2016 0926   K 5.0 03/18/2016 0926   CL 105 03/18/2016 0926   CO2 32 03/18/2016 0926   BUN 19 03/18/2016 0926   CREATININE 0.73 03/18/2016 0926   CREATININE 0.79 07/16/2015 1228   GLUCOSE 99 03/18/2016 0926   CALCIUM 8.9 03/18/2016 0926   Hepatic Function Latest Ref Rng & Units 03/18/2016  11/11/2015 09/30/2015  Total Protein 6.0 - 8.3 g/dL 6.0 6.3(L) 6.5  Albumin 3.5 - 5.2 g/dL 3.8 3.8 4.0  AST 0 - 37 U/L '19 21 24  ' ALT 0 - 35 U/L '21 25 31  ' Alk Phosphatase 39 - 117 U/L 76 82 85  Total Bilirubin 0.2 - 1.2 mg/dL 0.4 0.5 0.3  Bilirubin, Direct 0.0 - 0.3 mg/dL 0.1 - 0.1    Lab Results  Component Value Date   TSH 0.80 03/18/2016    Results for orders placed or performed in visit on 03/30/16  POCT Urinalysis Dipstick (Automated)  Result Value Ref Range   Color, UA yellow    Clarity, UA clear    Glucose, UA negative    Bilirubin, UA negative    Ketones, UA negative    Spec Grav, UA >=1.030    Blood, UA negative    pH, UA 5.5    Protein, UA negative    Urobilinogen, UA 0.2    Nitrite, UA negative    Leukocytes, UA Negative Negative     Assessment and Plan:   Healthcare maintenance  Need for prophylactic vaccination and inoculation against influenza - Plan: Flu Vaccine QUAD 36+ mos IM  Urinary hesitancy - Plan: POCT Urinalysis Dipstick  (Automated) UA clear - observe only for now, offered urology eval  Health Maintenance Exam: The patient's preventative maintenance and recommended screening tests for an annual wellness exam were reviewed in full today. Brought up to date unless services declined.  Counselled on the importance of diet, exercise, and its role in overall health and mortality. The patient's FH and SH was reviewed, including their home life, tobacco status, and drug and alcohol status.  I have personally reviewed the Medicare Annual Wellness questionnaire and have noted 1. The patient's medical and social history 2. Their use of alcohol, tobacco or illicit drugs 3. Their current medications and supplements 4. The patient's functional ability including ADL's, fall risks, home safety risks and hearing or visual             impairment. 5. Diet and physical activities 6. Evidence for depression or mood disorders 7. Reviewed Updated provider list, see scanned forms and [facility] Snapshot.   The patients weight, height, BMI and visual acuity have been recorded in the chart I have made referrals, counseling and provided education to the patient based review of the above and I have provided the pt with a written personalized care plan for preventive services.  I have provided the patient with a copy of your personalized plan for preventive services. Instructed to take the time to review along with their updated medication list.  Follow-up: No Follow-up on file. Or follow-up in 1 year for complete physical examination  Orders Placed This Encounter  Procedures  . Flu Vaccine QUAD 36+ mos IM  . POCT Urinalysis Dipstick (Automated)    Signed,  Beatrix Breece T. Loretta Doutt, MD   Patient's Medications  New Prescriptions   No medications on file  Previous Medications   ASPIRIN 81 MG TABLET    Take 81 mg by mouth daily.   CALCIUM CARBONATE-VITAMIN D (CALCIUM + D) 600-200 MG-UNIT TABS    Take 1 tablet by mouth daily.     FLUTICASONE (FLONASE) 50 MCG/ACT NASAL SPRAY    Place 2 sprays into both nostrils daily as needed for allergies or rhinitis (allergies).   GLUCOSAMINE-CHONDROITIN 500-400 MG TABLET    Take 1 tablet by mouth daily.     LEVOTHYROXINE (SYNTHROID, LEVOTHROID) 88 MCG  TABLET    TAKE ONE TABLET BY MOUTH EVERY MORNING BEFORE BREAKFAST  Modified Medications   No medications on file  Discontinued Medications   CIPROFLOXACIN (CIPRO) 500 MG TABLET    Take 1 tablet (500 mg total) by mouth 2 (two) times daily.

## 2016-06-08 ENCOUNTER — Other Ambulatory Visit: Payer: Self-pay | Admitting: Family Medicine

## 2016-06-25 ENCOUNTER — Ambulatory Visit (INDEPENDENT_AMBULATORY_CARE_PROVIDER_SITE_OTHER): Payer: Medicare Other | Admitting: Family Medicine

## 2016-06-25 ENCOUNTER — Encounter: Payer: Self-pay | Admitting: Family Medicine

## 2016-06-25 VITALS — BP 122/70 | HR 86 | Temp 98.6°F | Wt 162.2 lb

## 2016-06-25 DIAGNOSIS — R05 Cough: Secondary | ICD-10-CM | POA: Diagnosis not present

## 2016-06-25 DIAGNOSIS — J111 Influenza due to unidentified influenza virus with other respiratory manifestations: Secondary | ICD-10-CM | POA: Diagnosis not present

## 2016-06-25 DIAGNOSIS — R059 Cough, unspecified: Secondary | ICD-10-CM

## 2016-06-25 LAB — POC INFLUENZA A&B (BINAX/QUICKVUE)
INFLUENZA A, POC: POSITIVE — AB
INFLUENZA B, POC: NEGATIVE

## 2016-06-25 MED ORDER — OSELTAMIVIR PHOSPHATE 75 MG PO CAPS: 75.0000 mg | ORAL_CAPSULE | Freq: Two times a day (BID) | ORAL | 0 refills | Status: DC

## 2016-06-25 NOTE — Progress Notes (Signed)
SUBJECTIVE:  Monica Irwin is a 70 y.o. female pt of Dr. Lorelei Pont, new to me,  who present complaining of flu-like symptoms: fevers, chills, myalgias, congestion, sore throat and cough for 1 day. Denies dyspnea or wheezing.  Current Outpatient Prescriptions on File Prior to Visit  Medication Sig Dispense Refill  . aspirin 81 MG tablet Take 81 mg by mouth daily.    . Calcium Carbonate-Vitamin D (CALCIUM + D) 600-200 MG-UNIT TABS Take 1 tablet by mouth daily.     . fluticasone (FLONASE) 50 MCG/ACT nasal spray INHALE 2 SPRAYS INTO EACH NOSTRIL EVERY DAY 16 g 11  . glucosamine-chondroitin 500-400 MG tablet Take 1 tablet by mouth daily.      [name] levothyroxine (SYNTHROID, LEVOTHROID) 88 MCG tablet TAKE ONE TABLET BY MOUTH EVERY MORNING BEFORE BREAKFAST 30 tablet 5   No current facility-administered medications on file prior to visit.     No Known Allergies  Past Medical History:  Diagnosis Date  . AVNRT (AV nodal re-entry tachycardia) ([place]) 08/19/2015   S/p successful ablation 07/2015.  [name] Headache   . History of colonic polyps   . Hypothyroidism   . Liver cyst   . Nephrolithiasis   . Osteoarthritis   . Osteoporosis   . SVT (supraventricular tachycardia) ([place]) 05/07/2015    Past Surgical History:  Procedure Laterality Date  . BACK SURGERY     [phone]  . ELECTROPHYSIOLOGIC STUDY N/A 07/22/2015   Procedure: SVT Ablation;  Surgeon: Will Meredith Leeds, MD;  Location: [facility];  Service: Cardiovascular;  Laterality: N/A;  . PARTIAL COLECTOMY  2005  . SMALL INTESTINE SURGERY    . THYROIDECTOMY  2008    Family History  Problem Relation Age of Onset  . Stroke Mother   . Alzheimer's disease Mother   . Heart failure Brother   . Alzheimer's disease Sister   . Heart attack Brother   . Colon cancer      nephew  . Ulcerative colitis Sister     colon removed    Social History   Social History  . Marital status: Divorced    Spouse name: N/A  . Number of children: 1   . Years of education: N/A   Occupational History  . retired [employer] Retired   Social History Main Topics  . Smoking status: Never Smoker  . Smokeless tobacco: Never Used  . Alcohol use 0.0 oz/week     Comment: rarely  . Drug use: No  . Sexual activity: No   Other Topics Concern  . Not on file   Social History Narrative   Regular exercise--no            Epworth Sleepiness Scale      Total: 4                  The PMH, PSH, Social History, Family History, Medications, and allergies have been reviewed in [facility], and have been updated if relevant.  OBJECTIVE: BP 122/70   Pulse 86   Temp 98.6 F (37 C) (Oral)   Wt 162 lb 4 oz (73.6 kg)   SpO2 97%   BMI 28.29 kg/m   Appears moderately ill but not toxic; temperature as noted in vitals. Ears normal. Throat and pharynx normal.  Neck supple. No adenopathy in the neck. Sinuses non tender. The chest is clear.  ASSESSMENT: Influenza  PLAN: Symptomatic therapy suggested: increase fluids and call prn if symptoms persist or worsen. Call or return  to clinic prn if these symptoms worsen or fail to improve as anticipated.

## 2016-06-25 NOTE — Patient Instructions (Signed)

## 2016-06-25 NOTE — Progress Notes (Signed)
Pre visit review using our clinic review tool, if applicable. No additional management support is needed unless otherwise documented below in the visit note. 

## 2016-08-10 ENCOUNTER — Ambulatory Visit (INDEPENDENT_AMBULATORY_CARE_PROVIDER_SITE_OTHER)
Admission: RE | Admit: 2016-08-10 | Discharge: 2016-08-10 | Disposition: A | Discharge status: Home / Self Care | Payer: Medicare Other | Source: Ambulatory Visit | Attending: Family Medicine | Admitting: Family Medicine

## 2016-08-10 ENCOUNTER — Encounter: Payer: Self-pay | Admitting: Family Medicine

## 2016-08-10 ENCOUNTER — Ambulatory Visit (INDEPENDENT_AMBULATORY_CARE_PROVIDER_SITE_OTHER): Payer: Medicare Other | Admitting: Family Medicine

## 2016-08-10 VITALS — BP 118/72 | HR 77 | Temp 98.4°F | Ht 63.5 in | Wt 160.0 lb

## 2016-08-10 DIAGNOSIS — M659 Synovitis and tenosynovitis, unspecified: Secondary | ICD-10-CM

## 2016-08-10 DIAGNOSIS — M25511 Pain in right shoulder: Secondary | ICD-10-CM

## 2016-08-10 DIAGNOSIS — M65949 Unspecified synovitis and tenosynovitis, unspecified hand: Secondary | ICD-10-CM

## 2016-08-10 MED ORDER — METHYLPREDNISOLONE ACETATE 40 MG/ML IJ SUSP
20.0000 mg | Freq: Once | INTRAMUSCULAR | Status: AC
  Administered 2016-08-10: 20 mg via INTRA_ARTICULAR

## 2016-08-10 NOTE — Progress Notes (Signed)
Pre visit review using our clinic review tool, if applicable. No additional management support is needed unless otherwise documented below in the visit note. 
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
## 2016-08-20 ENCOUNTER — Encounter: Payer: Self-pay | Admitting: Family Medicine

## 2016-08-20 ENCOUNTER — Ambulatory Visit (INDEPENDENT_AMBULATORY_CARE_PROVIDER_SITE_OTHER): Payer: Medicare Other | Admitting: Family Medicine

## 2016-08-20 DIAGNOSIS — S46001D Unspecified injury of muscle(s) and tendon(s) of the rotator cuff of right shoulder, subsequent encounter: Secondary | ICD-10-CM

## 2016-08-20 DIAGNOSIS — S46001A Unspecified injury of muscle(s) and tendon(s) of the rotator cuff of right shoulder, initial encounter: Secondary | ICD-10-CM | POA: Insufficient documentation

## 2016-08-20 MED ORDER — TRAMADOL HCL 50 MG PO TABS: 50.0000 mg | ORAL_TABLET | Freq: Three times a day (TID) | ORAL | 0 refills | Status: DC | PRN

## 2016-08-20 NOTE — Progress Notes (Signed)
Pre visit review using our clinic review tool, if applicable. No additional management support is needed unless otherwise documented below in the visit note. 

## 2016-08-20 NOTE — Assessment & Plan Note (Signed)
With very limited ROM. Advised NSAID, she has alleve at home, with food. Rx for tramadol printed and given to pt to use as needed for severe pain.  Discussed sedation precautions. Refer to ortho- concern for tear. The patient indicates understanding of these issues and agrees with the plan.

## 2016-08-20 NOTE — Patient Instructions (Addendum)
Great to see you.  Please stop by to see Rosaria Ferries on your way out.  Ok to take [employer] like we discussed (with food) and tramadol for severe pain.
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
## 2016-08-31 ENCOUNTER — Ambulatory Visit: Payer: Medicare Other | Admitting: Family Medicine

## 2016-09-04 ENCOUNTER — Other Ambulatory Visit: Payer: Self-pay | Admitting: Family Medicine

## 2016-09-08 ENCOUNTER — Other Ambulatory Visit: Payer: Self-pay | Admitting: Family Medicine

## 2016-09-08 ENCOUNTER — Other Ambulatory Visit: Payer: Self-pay | Admitting: Orthopedic Surgery

## 2016-09-08 ENCOUNTER — Telehealth: Payer: Self-pay

## 2016-09-08 ENCOUNTER — Other Ambulatory Visit: Payer: Self-pay | Admitting: Physician Assistant

## 2016-09-08 DIAGNOSIS — M25511 Pain in right shoulder: Secondary | ICD-10-CM

## 2016-09-08 DIAGNOSIS — R413 Other amnesia: Secondary | ICD-10-CM

## 2016-09-08 NOTE — Telephone Encounter (Signed)
Monica Irwin pts daughter(DPR signed) said Monica Irwin has been having serious memory issues for several months. Request referral to [facility]. Monica Irwin is in agreement for Monica Irwin to go for memory issues; strong hx of altzheimers in family. Last annual 03/30/16.Monica Irwin has not seen Dr Lorelei Pont for memory issues but [name] said memory is so bad needs to see specialist and not PCP.

## 2016-09-08 NOTE — Telephone Encounter (Signed)
I saw her 1 month ago. Known very well - reasonable request.

## 2016-09-16 ENCOUNTER — Ambulatory Visit
Admission: RE | Admit: 2016-09-16 | Discharge: 2016-09-16 | Disposition: A | Discharge status: Home / Self Care | Payer: PRIVATE HEALTH INSURANCE | Source: Ambulatory Visit | Attending: Orthopedic Surgery | Admitting: Orthopedic Surgery

## 2016-09-16 DIAGNOSIS — M25511 Pain in right shoulder: Secondary | ICD-10-CM

## 2016-09-21 ENCOUNTER — Other Ambulatory Visit: Payer: Medicare Other

## 2016-09-21 ENCOUNTER — Other Ambulatory Visit: Payer: PRIVATE HEALTH INSURANCE

## 2016-09-22 ENCOUNTER — Other Ambulatory Visit: Payer: PRIVATE HEALTH INSURANCE

## 2016-09-23 ENCOUNTER — Ambulatory Visit: Payer: Self-pay | Admitting: Neurology

## 2016-09-27 ENCOUNTER — Other Ambulatory Visit: Payer: PRIVATE HEALTH INSURANCE

## 2016-10-06 ENCOUNTER — Ambulatory Visit: Payer: Self-pay | Admitting: Neurology

## 2016-10-19 ENCOUNTER — Encounter: Payer: Self-pay | Admitting: Neurology

## 2016-10-19 ENCOUNTER — Encounter (INDEPENDENT_AMBULATORY_CARE_PROVIDER_SITE_OTHER): Payer: Self-pay

## 2016-10-19 ENCOUNTER — Ambulatory Visit (INDEPENDENT_AMBULATORY_CARE_PROVIDER_SITE_OTHER): Payer: Medicare Other | Admitting: Neurology

## 2016-10-19 DIAGNOSIS — G3184 Mild cognitive impairment, so stated: Secondary | ICD-10-CM

## 2016-10-19 MED ORDER — MEMANTINE HCL 10 MG PO TABS: 10.0000 mg | ORAL_TABLET | Freq: Two times a day (BID) | ORAL | 11 refills | Status: DC

## 2016-10-19 MED ORDER — DONEPEZIL HCL 10 MG PO TABS: 10.0000 mg | ORAL_TABLET | Freq: Every day | ORAL | 11 refills | Status: DC
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
## 2016-10-20 LAB — COMPREHENSIVE METABOLIC PANEL
A/G RATIO: 1.4 (ref 1.2–2.2)
ALBUMIN: 3.6 g/dL (ref 3.5–4.8)
ALK PHOS: 68 IU/L (ref 39–117)
ALT: 12 IU/L (ref 0–32)
AST: 14 IU/L (ref 0–40)
BUN / CREAT RATIO: 34 — AB (ref 12–28)
BUN: 22 mg/dL (ref 8–27)
CHLORIDE: 103 mmol/L (ref 96–106)
CO2: 24 mmol/L (ref 18–29)
Calcium: 9.2 mg/dL (ref 8.7–10.3)
Creatinine, Ser: 0.65 mg/dL (ref 0.57–1.00)
GFR calc non Af Amer: 89 mL/min/{1.73_m2} (ref >59)
GFR, EST AFRICAN AMERICAN: 103 mL/min/{1.73_m2} (ref >59)
GLOBULIN, TOTAL: 2.5 g/dL (ref 1.5–4.5)
Glucose: 131 mg/dL — ABNORMAL HIGH (ref 65–99)
POTASSIUM: 4.1 mmol/L (ref 3.5–5.2)
SODIUM: 144 mmol/L (ref 134–144)
TOTAL PROTEIN: 6.1 g/dL (ref 6.0–8.5)

## 2016-10-20 LAB — CBC
HEMATOCRIT: 39.8 % (ref 34.0–46.6)
Hemoglobin: 13.3 g/dL (ref 11.1–15.9)
MCH: 29.7 pg (ref 26.6–33.0)
MCHC: 33.4 g/dL (ref 31.5–35.7)
MCV: 89 fL (ref 79–97)
Platelets: 309 10*3/uL (ref 150–379)
RBC: 4.48 x10E6/uL (ref 3.77–5.28)
RDW: 13.2 % (ref 12.3–15.4)
WBC: 7.5 10*3/uL (ref 3.4–10.8)

## 2016-10-20 LAB — TSH: TSH: 1.18 u[IU]/mL (ref 0.450–4.500)

## 2016-10-20 LAB — VITAMIN B12: Vitamin B-12: 598 pg/mL (ref 232–1245)

## 2016-10-20 LAB — FOLATE: Folate: 16.8 ng/mL (ref >3.0)

## 2017-01-01 ENCOUNTER — Encounter: Payer: Self-pay | Admitting: Family Medicine

## 2017-01-18 ENCOUNTER — Telehealth: Payer: Self-pay

## 2017-01-18 NOTE — Telephone Encounter (Signed)
Patient calls requesting that we review her immunization history and be sure that she has received all of the necessary recommended shots.    Upon review of our documented immunization history, appears that patient is mostly up to date other than possibly a Tdap (if indicated) and Shingrix once it becomes available.  Please Advise.

## 2017-01-18 NOTE — Telephone Encounter (Signed)
Immunization History  Administered Date(s) Administered  . Influenza Whole 04/10/2009, 03/13/2010  . Influenza,inj,Quad PF,36+ Mos 04/22/2015, 03/30/2016  . Influenza-Unspecified 04/12/2013  . Pneumococcal Conjugate-13 11/08/2013  . Pneumococcal Polysaccharide-23 05/22/2010  . Td 07/13/2001  . Zoster 07/13/2001    She is up-to-date with the exception of tetanus booster.  Medicare does not pay for tetanus unless there is a cut or accident.  She can pay out of pocket if she would like.  She has not had Shingrix, the new shingles vaccine which is not widely available.  There is a Producer, television/film/video of this vaccine.

## 2017-01-19 NOTE — Telephone Encounter (Signed)
Monica Irwin notified as instructed by telephone.  She will check with the pharmacy about getting the tetanus and shingrix.

## 2017-02-04 ENCOUNTER — Ambulatory Visit: Payer: Medicare Other | Admitting: Neurology

## 2017-02-11 ENCOUNTER — Ambulatory Visit (INDEPENDENT_AMBULATORY_CARE_PROVIDER_SITE_OTHER): Payer: Medicare Other | Admitting: Family Medicine

## 2017-02-11 ENCOUNTER — Encounter: Payer: Self-pay | Admitting: Family Medicine

## 2017-02-11 VITALS — BP 116/64 | HR 79 | Temp 97.9°F | Ht 63.5 in | Wt 161.4 lb

## 2017-02-11 DIAGNOSIS — N3000 Acute cystitis without hematuria: Secondary | ICD-10-CM | POA: Diagnosis not present

## 2017-02-11 DIAGNOSIS — R8290 Unspecified abnormal findings in urine: Secondary | ICD-10-CM | POA: Diagnosis not present

## 2017-02-11 DIAGNOSIS — N390 Urinary tract infection, site not specified: Secondary | ICD-10-CM | POA: Insufficient documentation

## 2017-02-11 LAB — POCT URINALYSIS DIPSTICK
BILIRUBIN UA: NEGATIVE
Glucose, UA: NEGATIVE
KETONES UA: NEGATIVE
Nitrite, UA: POSITIVE
PH UA: 6.5 (ref 5.0–8.0)
Protein, UA: 15
RBC UA: NEGATIVE
Spec Grav, UA: 1.02 (ref 1.010–1.025)
Urobilinogen, UA: 0.2 E.U./dL

## 2017-02-11 MED ORDER — SULFAMETHOXAZOLE-TRIMETHOPRIM 800-160 MG PO TABS: 1.0000 | ORAL_TABLET | Freq: Two times a day (BID) | ORAL | 0 refills | Status: DC

## 2017-02-11 NOTE — Assessment & Plan Note (Signed)
Anticipate simple cystitis given UA findings. Rx bactrim DS 5d course. Update if not improving with treatment. Discussed pushing fluids, avoiding bladder irritants. Pt agrees with plan.

## 2017-02-11 NOTE — Patient Instructions (Signed)

## 2017-02-11 NOTE — Progress Notes (Signed)
BP 116/64 (BP Location: Left Arm, Patient Position: Sitting, Cuff Size: Normal)   Pulse 79   Temp 97.9 F (36.6 C) (Oral)   Ht 5' 3.5" (1.613 m)   Wt 161 lb 6.4 oz (73.2 kg)   SpO2 99%   BMI 28.14 kg/m    CC: UTI Subjective:    Patient ID: Monica Irwin, female    DOB: 1946/02/11, 71 y.o.   MRN: [id number]  HPI: Monica Irwin is a 71 y.o. female presenting on 02/11/2017 for Urinary Tract Infection (x2 weeks, no burning, foul smelling odor)   1.5 wk h/o foul smelling urine. No other typical symptoms of dysuria, frequency, hematuria. She does have intermittent chronic urinary urgency. No fevers/chills, nausea, abdominal or flank pain. No vaginal discharge, rash or itching, bowel changes.  No recent abx. No h/o recurrent UTIs. Remote kidney stone, no problems recently.   Relevant past medical, surgical, family and social history reviewed and updated as indicated. Interim medical history since our last visit reviewed. Allergies and medications reviewed and updated. Outpatient Medications Prior to Visit  Medication Sig Dispense Refill  . Acetaminophen (TYLENOL ARTHRITIS PAIN PO) Take by mouth as needed.    [name] levothyroxine (SYNTHROID, LEVOTHROID) 88 MCG tablet TAKE 1 TABLET BY MOUTH EVERY MORNING 30 MINUTES BEFORE BREAKFAST. 90 tablet 1  . donepezil (ARICEPT) 10 MG tablet Take 1 tablet (10 mg total) by mouth at bedtime. (Patient not taking: Reported on 02/11/2017) 30 tablet 11  . fluticasone (FLONASE) 50 MCG/ACT nasal spray INHALE 2 SPRAYS INTO EACH NOSTRIL EVERY DAY (Patient not taking: Reported on 02/11/2017) 16 g 11  . glucosamine-chondroitin 500-400 MG tablet Take 1 tablet by mouth daily.      . memantine (NAMENDA) 10 MG tablet Take 1 tablet (10 mg total) by mouth 2 (two) times daily. (Patient not taking: Reported on 02/11/2017) 60 tablet 11  . traMADol (ULTRAM) 50 MG tablet Take 1 tablet (50 mg total) by mouth every 8 (eight) hours as needed. (Patient not taking: Reported on  02/11/2017) 30 tablet 0  . Calcium Carbonate-Vitamin D (CALCIUM + D) 600-200 MG-UNIT TABS Take 1 tablet by mouth daily.      No facility-administered medications prior to visit.      Per HPI unless specifically indicated in ROS section below Review of Systems     Objective:    BP 116/64 (BP Location: Left Arm, Patient Position: Sitting, Cuff Size: Normal)   Pulse 79   Temp 97.9 F (36.6 C) (Oral)   Ht 5' 3.5" (1.613 m)   Wt 161 lb 6.4 oz (73.2 kg)   SpO2 99%   BMI 28.14 kg/m   Wt Readings from Last 3 Encounters:  02/11/17 161 lb 6.4 oz (73.2 kg)  10/19/16 153 lb (69.4 kg)  08/20/16 160 lb 8 oz (72.8 kg)    Physical Exam  Constitutional: She appears well-developed and well-nourished. No distress.  Abdominal: Soft. Normal appearance and bowel sounds are normal. She exhibits no distension and no mass. There is no hepatosplenomegaly. There is no tenderness. There is no rigidity, no rebound, no guarding, no CVA tenderness and negative Murphy's sign.  Musculoskeletal: She exhibits no edema.  Psychiatric: She has a normal mood and affect.  Nursing note and vitals reviewed.  Results for orders placed or performed in visit on 02/11/17  POCT urinalysis dipstick  Result Value Ref Range   Color, UA dark yellow    Clarity, UA cloudy    Glucose, UA neg  Bilirubin, UA neg    Ketones, UA neg    Spec Grav, UA 1.020 1.010 - 1.025   Blood, UA neg    pH, UA 6.5 5.0 - 8.0   Protein, UA 15    Urobilinogen, UA 0.2 0.2 or 1.0 E.U./dL   Nitrite, UA pos    Leukocytes, UA Large (3+) (A) Negative      Assessment & Plan:   Problem List Items Addressed This Visit    UTI (urinary tract infection) - Primary    Anticipate simple cystitis given UA findings. Rx bactrim DS 5d course. Update if not improving with treatment. Discussed pushing fluids, avoiding bladder irritants. Pt agrees with plan.       Relevant Medications   sulfamethoxazole-trimethoprim (BACTRIM DS,SEPTRA DS) 800-160 MG tablet     Other Visit Diagnoses    Foul smelling urine       Relevant Orders   POCT urinalysis dipstick (Completed)       Follow up plan: Return if symptoms worsen or fail to improve.  Ria Bush, MD

## 2017-03-03 ENCOUNTER — Encounter: Payer: Self-pay | Admitting: Primary Care

## 2017-03-03 ENCOUNTER — Ambulatory Visit (INDEPENDENT_AMBULATORY_CARE_PROVIDER_SITE_OTHER): Payer: Medicare Other | Admitting: Primary Care

## 2017-03-03 VITALS — BP 120/80 | HR 65 | Temp 98.1°F | Wt 160.8 lb

## 2017-03-03 DIAGNOSIS — R350 Frequency of micturition: Secondary | ICD-10-CM | POA: Diagnosis not present

## 2017-03-03 DIAGNOSIS — R8290 Unspecified abnormal findings in urine: Secondary | ICD-10-CM | POA: Diagnosis not present

## 2017-03-03 DIAGNOSIS — R739 Hyperglycemia, unspecified: Secondary | ICD-10-CM | POA: Diagnosis not present

## 2017-03-03 LAB — BASIC METABOLIC PANEL
BUN: 22 mg/dL (ref 6–23)
CALCIUM: 10.1 mg/dL (ref 8.4–10.5)
CHLORIDE: 104 meq/L (ref 96–112)
CO2: 27 meq/L (ref 19–32)
Creatinine, Ser: 0.8 mg/dL (ref 0.40–1.20)
GFR: 74.68 mL/min (ref >60.00)
GLUCOSE: 105 mg/dL — AB (ref 70–99)
POTASSIUM: 4.3 meq/L (ref 3.5–5.1)
SODIUM: 140 meq/L (ref 135–145)

## 2017-03-03 LAB — POC URINALSYSI DIPSTICK (AUTOMATED)
Bilirubin, UA: NEGATIVE
Blood, UA: NEGATIVE
Glucose, UA: NEGATIVE
Ketones, UA: NEGATIVE
NITRITE UA: NEGATIVE
PROTEIN UA: NEGATIVE
Spec Grav, UA: 1.015 (ref 1.010–1.025)
UROBILINOGEN UA: 0.2 U/dL
pH, UA: 8 (ref 5.0–8.0)

## 2017-03-03 LAB — HEMOGLOBIN A1C: Hgb A1c MFr Bld: 6.4 % (ref 4.6–6.5)

## 2017-03-03 NOTE — Progress Notes (Signed)
Subjective:    Patient ID: Monica Irwin, female    DOB: [birth year], 71 y.o.   MRN: [id number]  HPI  Monica Irwin is a 71 year old female with a history of mild cogitative impairment, hypothyroidism, hyperglycemia who presents today with a chief complaint of body odor.   The body odor is located under her left arm only. She first noticed this odor under her arm about 4-5 days ago. She denies rashes, pain, changes in food/medications, polydipsia. She thinks she's noticed increased urinary frequency over the past 2 weeks. She will typically take a shower every morning which will help to reduce the odor, but the odor doesn't completely dissipate. There is no odor noticed under the right arm.   She was treated with Bactrim DS in early August and feels her symptoms have resolved from then. She drinks little to no water daily.   Review of Systems  Constitutional: Negative for fever.  Respiratory: Negative for shortness of breath.   Cardiovascular: Negative for chest pain.  Endocrine: Negative for polyphagia.  Genitourinary: Positive for frequency. Negative for dysuria, flank pain, hematuria, pelvic pain and vaginal discharge.  Skin:       Body odor under left axilla       Past Medical History:  Diagnosis Date  . AVNRT (AV nodal re-entry tachycardia) ([place]) 08/19/2015   S/p successful ablation 07/2015.  [name] Headache   . History of colonic polyps   . Hypothyroidism   . Liver cyst   . Memory loss   . Nephrolithiasis   . Osteoarthritis   . Osteoporosis   . SVT (supraventricular tachycardia) ([place]) 05/07/2015     Social History   Social History  . Marital status: Divorced    Spouse name: N/A  . Number of children: 1  . Years of education: HS   Occupational History  . retired [employer] Retired   Social History Main Topics  . Smoking status: Never Smoker  . Smokeless tobacco: Never Used  . Alcohol use 0.0 oz/week     Comment: rarely  . Drug use: No  .  Sexual activity: No   Other Topics Concern  . Not on file   Social History Narrative   Regular exercise--no   Lives at home alone.   Right-handed.   1 cup caffeine daily.            Epworth Sleepiness Scale      Total: 4                   Past Surgical History:  Procedure Laterality Date  . BACK SURGERY     [phone]  . ELECTROPHYSIOLOGIC STUDY N/A 07/22/2015   Procedure: SVT Ablation;  Surgeon: Will Meredith Leeds, MD;  Location: [place];  Service: Cardiovascular;  Laterality: N/A;  . PARTIAL COLECTOMY  2005  . SMALL INTESTINE SURGERY    . THYROIDECTOMY  2008    Family History  Problem Relation Age of Onset  . Stroke Mother   . Alzheimer's disease Mother   . Heart failure Brother   . Alzheimer's disease Sister   . Other Father        natural causes  . Heart attack Brother   . Colon cancer Unknown        nephew  . Ulcerative colitis Sister        colon removed    No Known Allergies  Current Outpatient Prescriptions on File Prior to Visit  Medication  Sig Dispense Refill  . levothyroxine (SYNTHROID, LEVOTHROID) 88 MCG tablet TAKE 1 TABLET BY MOUTH EVERY MORNING 30 MINUTES BEFORE BREAKFAST. 90 tablet 1  . donepezil (ARICEPT) 10 MG tablet Take 1 tablet (10 mg total) by mouth at bedtime. (Patient not taking: Reported on 03/03/2017) 30 tablet 11  . memantine (NAMENDA) 10 MG tablet Take 1 tablet (10 mg total) by mouth 2 (two) times daily. (Patient not taking: Reported on 03/03/2017) 60 tablet 11   No current facility-administered medications on file prior to visit.     BP 120/80 (BP Location: Left Arm, Patient Position: Sitting, Cuff Size: Normal)   Pulse 65   Temp 98.1 F (36.7 C) (Oral)   Wt 160 lb 12 oz (72.9 kg)   SpO2 98%   BMI 28.03 kg/m    Objective:   Physical Exam  Constitutional: She is oriented to person, place, and time. She appears well-nourished.  Neck: Neck supple.  Cardiovascular: Normal rate and regular rhythm.     Pulmonary/Chest: Effort normal and breath sounds normal.  Neurological: She is alert and oriented to person, place, and time.  Skin: Skin is warm and dry. No rash noted.  No rashes, erythema, changes in skin texture to bilateral axilla. Mild body odor noted to left axilla.          Assessment & Plan:  Body Odor:  Only under left axilla x 4-5 days. Exam today without obvious cause.  Will follow up on hyperglycemia noted from labs in April 2018 given reports of urinary frequency with UTI in early August 2018. Also check BMP and repeat urinalysis.  Recommended mild deodorant daily. Discussed to try showering at bedtime, change sheets weekly.  Will await labs.  Sheral Flow, NP

## 2017-03-03 NOTE — Patient Instructions (Signed)
Complete lab work prior to leaving today. I will notify you of your results once received.   Try using a mild deodorant daily.   Wash bed sheets and lines weekly.    It was a pleasure meeting you!

## 2017-03-04 ENCOUNTER — Telehealth: Payer: Self-pay | Admitting: Primary Care

## 2017-03-04 LAB — URINE CULTURE

## 2017-03-04 NOTE — Telephone Encounter (Signed)
Patient returned Chan's call. °

## 2017-03-05 NOTE — Telephone Encounter (Signed)
Spoken and notified patient of [name]'s comments. Patient verbalized understanding. 

## 2017-03-10 ENCOUNTER — Telehealth: Payer: Self-pay | Admitting: Neurology

## 2017-03-10 NOTE — Telephone Encounter (Signed)
Pt son in law (on [place]) calling to get clarity on which of pt's medications is specifically for her memory.  Pt son in law states pt threw medication away and son in law now asking for a call back re: medication please

## 2017-03-10 NOTE — Telephone Encounter (Signed)
I have spoken with Jackelyn Poling this afternoon--she sts. pt. stopped Namenda and Aricept, unsure when.  Likely several weeks ago.  They want to restart these meds, but thought YY told them to start one med, then 2 wks. later start the other one.  I have explained that, since both meds can cause gi upset, ok to start Aricept and 2 wks. later add Namenda.  She verbalized understanding of same.  Pt. has f/u with YY scheduled/fim

## 2017-03-16 ENCOUNTER — Other Ambulatory Visit: Payer: Self-pay | Admitting: Family Medicine

## 2017-03-23 ENCOUNTER — Telehealth: Payer: Self-pay | Admitting: Family Medicine

## 2017-03-23 NOTE — Telephone Encounter (Signed)
Left pt message asking to call Ebony Hail back directly at [phone] to schedule AWV + labs with Katha Cabal and CPE with PCP.  *NOTE* Last AWV 03/30/16, please schedule 03/31/17 or after

## 2017-03-31 ENCOUNTER — Ambulatory Visit (INDEPENDENT_AMBULATORY_CARE_PROVIDER_SITE_OTHER): Payer: Medicare Other | Admitting: Neurology

## 2017-03-31 ENCOUNTER — Encounter: Payer: Self-pay | Admitting: Neurology

## 2017-03-31 VITALS — BP 121/74 | HR 69 | Ht 63.5 in | Wt 156.5 lb

## 2017-03-31 DIAGNOSIS — G3184 Mild cognitive impairment, so stated: Secondary | ICD-10-CM

## 2017-03-31 MED ORDER — DONEPEZIL HCL 10 MG PO TABS: 10.0000 mg | ORAL_TABLET | Freq: Every day | ORAL | 4 refills | Status: DC

## 2017-03-31 MED ORDER — MEMANTINE HCL 10 MG PO TABS: 10.0000 mg | ORAL_TABLET | Freq: Two times a day (BID) | ORAL | 4 refills | Status: DC
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
## 2017-04-05 ENCOUNTER — Ambulatory Visit: Payer: Medicare Other

## 2017-04-12 ENCOUNTER — Ambulatory Visit: Payer: Medicare Other | Admitting: Neurology

## 2017-04-21 NOTE — Telephone Encounter (Signed)
Scheduled 05/24/17

## 2017-05-21 ENCOUNTER — Ambulatory Visit: Payer: Medicare Other

## 2017-05-24 ENCOUNTER — Ambulatory Visit (INDEPENDENT_AMBULATORY_CARE_PROVIDER_SITE_OTHER): Payer: Medicare Other

## 2017-05-24 VITALS — BP 118/60 | HR 71 | Temp 98.5°F | Ht 63.5 in | Wt 162.5 lb

## 2017-05-24 DIAGNOSIS — Z23 Encounter for immunization: Secondary | ICD-10-CM

## 2017-05-24 DIAGNOSIS — E7841 Elevated Lipoprotein(a): Secondary | ICD-10-CM | POA: Diagnosis not present

## 2017-05-24 DIAGNOSIS — R739 Hyperglycemia, unspecified: Secondary | ICD-10-CM

## 2017-05-24 DIAGNOSIS — Z0000 Encounter for general adult medical examination without abnormal findings: Secondary | ICD-10-CM | POA: Diagnosis not present

## 2017-05-24 LAB — HEPATIC FUNCTION PANEL
ALBUMIN: 4.1 g/dL (ref 3.5–5.2)
ALK PHOS: 75 U/L (ref 39–117)
ALT: 24 U/L (ref 0–35)
AST: 20 U/L (ref 0–37)
Bilirubin, Direct: 0 mg/dL (ref 0.0–0.3)
TOTAL PROTEIN: 7 g/dL (ref 6.0–8.3)
Total Bilirubin: 0.5 mg/dL (ref 0.2–1.2)

## 2017-05-24 LAB — LIPID PANEL
Cholesterol: 248 mg/dL — ABNORMAL HIGH (ref 0–200)
HDL: 60 mg/dL (ref >39.00)
LDL Cholesterol: 160 mg/dL — ABNORMAL HIGH (ref 0–99)
NONHDL: 188.26
TRIGLYCERIDES: 142 mg/dL (ref 0.0–149.0)
Total CHOL/HDL Ratio: 4
VLDL: 28.4 mg/dL (ref 0.0–40.0)

## 2017-05-24 LAB — BASIC METABOLIC PANEL
BUN: 28 mg/dL — ABNORMAL HIGH (ref 6–23)
CO2: 28 meq/L (ref 19–32)
Calcium: 10.4 mg/dL (ref 8.4–10.5)
Chloride: 103 mEq/L (ref 96–112)
Creatinine, Ser: 0.83 mg/dL (ref 0.40–1.20)
GFR: 71.53 mL/min (ref >60.00)
GLUCOSE: 109 mg/dL — AB (ref 70–99)
POTASSIUM: 5.4 meq/L — AB (ref 3.5–5.1)
SODIUM: 139 meq/L (ref 135–145)

## 2017-05-24 LAB — HEMOGLOBIN A1C: HEMOGLOBIN A1C: 6.2 % (ref 4.6–6.5)

## 2017-05-24 NOTE — Progress Notes (Signed)
I reviewed health advisor's note, was available for consultation, and agree with documentation and plan.   Signed,  Jullianna Gabor T. Erek Kowal, MD  

## 2017-05-24 NOTE — Progress Notes (Signed)
PCP notes:   Health maintenance:   Abnormal screenings:   Mini-Cog score: 17/20  Hearing - failed  Hearing Screening   125Hz  250Hz  500Hz  1000Hz  2000Hz  3000Hz  4000Hz  6000Hz  8000Hz   Right ear:   0 40 40  0    Left ear:   0 40 40  0     Patient concerns:   Permanent disability placard - application completed and signed by PCP.   Nurse concerns:  None  Next PCP appt:   06/16/17 @ 1030

## 2017-05-24 NOTE — Patient Instructions (Signed)
Ms. Buist , Thank you for taking time to come for your Medicare Wellness Visit. I appreciate your ongoing commitment to your health goals. Please review the following plan we discussed and let me know if I can assist you in the future.   These are the goals we discussed: Goals    Starting 05/24/17, I will attempt to drink at least 6-8 glasses of water daily and to eat at least 4-5 servings of fresh fruits and vegetables daily.       This is a list of the screening recommended for you and due dates:  Health Maintenance  Topic Date Due  . Mammogram  01/02/2019  . Colon Cancer Screening  10/29/2020  . Tetanus Vaccine  01/21/2027  . Flu Shot  Completed  . DEXA scan (bone density measurement)  Completed  .  Hepatitis C: One time screening is recommended by Center for Disease Control  (CDC) for  adults born from [birth year] through 1967.   Completed  . Pneumonia vaccines  Completed   Preventive Care for Adults  A healthy lifestyle and preventive care can promote health and wellness. Preventive health guidelines for adults include the following key practices.  . A routine yearly physical is a good way to check with your health care provider about your health and preventive screening. It is a chance to share any concerns and updates on your health and to receive a thorough exam.  . Visit your dentist for a routine exam and preventive care every 6 months. Brush your teeth twice a day and floss once a day. Good oral hygiene prevents tooth decay and gum disease.  . The frequency of eye exams is based on your age, health, family medical history, use  of contact lenses, and other factors. Follow your health care provider's recommendations for frequency of eye exams.  . Eat a healthy diet. Foods like vegetables, fruits, whole grains, low-fat dairy products, and lean protein foods contain the nutrients you need without too many calories. Decrease your intake of foods high in solid fats, added sugars, and  salt. Eat the right amount of calories for you. Get information about a proper diet from your health care provider, if necessary.  . Regular physical exercise is one of the most important things you can do for your health. Most adults should get at least 150 minutes of moderate-intensity exercise (any activity that increases your heart rate and causes you to sweat) each week. In addition, most adults need muscle-strengthening exercises on 2 or more days a week.  Silver Sneakers may be a benefit available to you. To determine eligibility, you may visit the website: www.silversneakers.com or contact program at 218-120-7775 Mon-Fri between 8AM-8PM.   . Maintain a healthy weight. The body mass index (BMI) is a screening tool to identify possible weight problems. It provides an estimate of body fat based on height and weight. Your health care provider can find your BMI and can help you achieve or maintain a healthy weight.   For adults 20 years and older: ? A BMI below 18.5 is considered underweight. ? A BMI of 18.5 to 24.9 is normal. ? A BMI of 25 to 29.9 is considered overweight. ? A BMI of 30 and above is considered obese.   . Maintain normal blood lipids and cholesterol levels by exercising and minimizing your intake of saturated fat. Eat a balanced diet with plenty of fruit and vegetables. Blood tests for lipids and cholesterol should begin at age 10 and be  repeated every 5 years. If your lipid or cholesterol levels are high, you are over 50, or you are at high risk for heart disease, you may need your cholesterol levels checked more frequently. Ongoing high lipid and cholesterol levels should be treated with medicines if diet and exercise are not working.  . If you smoke, find out from your health care provider how to quit. If you do not use tobacco, please do not start.  . If you choose to drink alcohol, please do not consume more than 2 drinks per day. One drink is considered to be 12 ounces  (355 mL) of beer, 5 ounces (148 mL) of wine, or 1.5 ounces (44 mL) of liquor.  . If you are 84-83 years old, ask your health care provider if you should take aspirin to prevent strokes.  . Use sunscreen. Apply sunscreen liberally and repeatedly throughout the day. You should seek shade when your shadow is shorter than you. Protect yourself by wearing long sleeves, pants, a wide-brimmed hat, and sunglasses year round, whenever you are outdoors.  . Once a month, do a whole body skin exam, using a mirror to look at the skin on your back. Tell your health care provider of new moles, moles that have irregular borders, moles that are larger than a pencil eraser, or moles that have changed in shape or color.

## 2017-05-24 NOTE — Progress Notes (Signed)
Subjective:   Monica Irwin is a 71 y.o. female who presents for Medicare Annual (Subsequent) preventive examination.  Review of Systems:  N/A Cardiac Risk Factors include: advanced age (>15men, >100 women)     Objective:     Vitals: BP 118/60 (BP Location: Right Arm, Patient Position: Sitting, Cuff Size: Normal)   Pulse 71   Temp 98.5 F (36.9 C) (Oral)   Ht 5' 3.5" (1.613 m) Comment: no shoes  Wt 162 lb 8 oz (73.7 kg)   SpO2 98%   BMI 28.33 kg/m   Body mass index is 28.33 kg/m.   Tobacco Social History   Tobacco Use  Smoking Status Never Smoker  Smokeless Tobacco Never Used     Counseling given: No   Past Medical History:  Diagnosis Date  . AVNRT (AV nodal re-entry tachycardia) ([place]) 08/19/2015   S/p successful ablation 07/2015.  [name] Headache   . History of colonic polyps   . Hypothyroidism   . Liver cyst   . Memory loss   . Nephrolithiasis   . Osteoarthritis   . Osteoporosis   . SVT (supraventricular tachycardia) ([place]) 05/07/2015   Past Surgical History:  Procedure Laterality Date  . BACK SURGERY     [phone]  . PARTIAL COLECTOMY  2005  . SMALL INTESTINE SURGERY    . THYROIDECTOMY  2008   Family History  Problem Relation Age of Onset  . Stroke Mother   . Alzheimer's disease Mother   . Heart failure Brother   . Alzheimer's disease Sister   . Other Father        natural causes  . Heart attack Brother   . Colon cancer Unknown        nephew  . Ulcerative colitis Sister        colon removed   Social History   Substance and Sexual Activity  Sexual Activity No    Outpatient Encounter Medications as of 05/24/2017  Medication Sig  . donepezil (ARICEPT) 10 MG tablet Take 1 tablet (10 mg total) by mouth at bedtime.  [name] levothyroxine (SYNTHROID, LEVOTHROID) 88 MCG tablet TAKE 1 TABLET BY MOUTH EVERY MORNING 30 MINUTES BEFORE BREAKFAST.  . memantine (NAMENDA) 10 MG tablet Take 1 tablet (10 mg total) by mouth 2 (two) times daily.   No  facility-administered encounter medications on file as of 05/24/2017.     Activities of Daily Living In your present state of health, do you have any difficulty performing the following activities: 05/24/2017  Hearing? N  Vision? N  Difficulty concentrating or making decisions? Y  Walking or climbing stairs? N  Dressing or bathing? N  Doing errands, shopping? N  Preparing Food and eating ? N  Using the Toilet? N  In the past six months, have you accidently leaked urine? Y  Comment medication-related  Do you have problems with loss of bowel control? N  Managing your Medications? N  Managing your Finances? N  Housekeeping or managing your Housekeeping? N  Some recent data might be hidden    Patient Care Team: Owens Loffler, MD as PCP - General    Assessment:     Hearing Screening   125Hz  250Hz  500Hz  1000Hz  2000Hz  3000Hz  4000Hz  6000Hz  8000Hz   Right ear:   0 40 40  0    Left ear:   0 40 40  0      Visual Acuity Screening   Right eye Left eye Both eyes  Without correction: 20/40-1 20/25 20/25  With  correction:       Exercise Activities and Dietary recommendations Current Exercise Habits: The patient does not participate in regular exercise at present, Exercise limited by: None identified  Goals    Starting 05/24/17, I will attempt to drink at least 6-8 glasses of water daily and to eat at least 4-5 servings of fresh fruits and vegetables daily.      Fall Risk Fall Risk  05/24/2017 03/30/2016 02/13/2015 11/08/2013  Falls in the past year? No No No No   Depression Screen PHQ 2/9 Scores 05/24/2017 03/30/2016 02/13/2015 11/08/2013  PHQ - 2 Score 0 0 0 0  PHQ- 9 Score 0 - - -     Cognitive Function MMSE - Mini Mental State Exam 05/24/2017 03/31/2017 10/19/2016  Orientation to time 5 5 4   Orientation to Place 5 5 5   Registration 3 3 3   Attention/ Calculation 0 5 5  Recall 1 2 0  Recall-comments unable to recall 2 of 3 words - -  Language- name 2 objects 0 2 2  Language-  repeat 1 1 1   Language- follow 3 step command 2 3 3   Language- follow 3 step command-comments unable to follow 1 step of 3 step command - -  Language- read & follow direction 0 1 1  Write a sentence 0 1 1  Copy design 0 1 1  Total score 17 29 26      PLEASE NOTE: A Mini-Cog screen was completed. Maximum score is 20. A value of 0 denotes this part of [place] MMSE was not completed or the patient failed this part of the Mini-Cog screening.   Mini-Cog Screening Orientation to Time - Max 5 pts Orientation to Place - Max 5 pts Registration - Max 3 pts Recall - Max 3 pts Language Repeat - Max 1 pts Language Follow 3 Step Command - Max 3 pts     Immunization History  Administered Date(s) Administered  . Influenza Whole 04/10/2009, 03/13/2010  . Influenza,inj,Quad PF,6+ Mos 04/22/2015, 03/30/2016, 05/24/2017  . Influenza-Unspecified 04/12/2013  . Pneumococcal Conjugate-13 11/08/2013  . Pneumococcal Polysaccharide-23 05/22/2010  . Td 07/13/2001  . Tdap 01/20/2017  . Zoster 07/13/2001   Screening Tests Health Maintenance  Topic Date Due  . MAMMOGRAM  01/02/2019  . COLONOSCOPY  10/29/2020  . TETANUS/TDAP  01/21/2027  . INFLUENZA VACCINE  Completed  . DEXA SCAN  Completed  . Hepatitis C Screening  Completed  . PNA vac Low Risk Adult  Completed      Plan:     I have personally reviewed, addressed, and noted the following in the patient's chart:  A. Medical and social history B. Use of alcohol, tobacco or illicit drugs  C. Current medications and supplements D. Functional ability and status E.  Nutritional status F.  Physical activity G. Advance directives H. List of other physicians I.  Hospitalizations, surgeries, and ER visits in previous 12 months J.  [place] to include hearing, vision, cognitive, depression L. Referrals and appointments - none  In addition, I have reviewed and discussed with patient certain preventive protocols, quality metrics, and best  practice recommendations. A written personalized care plan for preventive services as well as general preventive health recommendations were provided to patient.  See attached scanned questionnaire for additional information.   Signed,   Lindell Noe, MHA, BS, LPN Health Coach

## 2017-05-24 NOTE — Progress Notes (Signed)
Pre visit review using our clinic review tool, if applicable. No additional management support is needed unless otherwise documented below in the visit note. 

## 2017-06-10 ENCOUNTER — Telehealth: Payer: Self-pay

## 2017-06-10 NOTE — Telephone Encounter (Signed)
LOV 08-19-15    [facility] Pre-operative Risk Assessment    Request for surgical clearance:  1. What type of surgery is being performed? [id number]   2. When is this surgery scheduled? PENDING   3. Are there any medications that need to be held prior to surgery and how long?NONE LISTED-TAKES aspirin 81  4. Practice name and name of physician performing surgery? [facility] Rod Can   5. What is your office phone and fax number? [phone] FX [phone]   6. Anesthesia type (None, local, MAC, general) ? NONE LISTED   Waylan Rocher 06/10/2017, 7:24 AM  _________________________________________________________________   (provider comments below)

## 2017-06-11 NOTE — Telephone Encounter (Signed)
Reached out to pts daughter, [name], [place] on file. She has been made aware that pt hasn't been seen in the office almost 2 years and needs an o/v before she can be cleared for sx. I looked for Dr. Curt Bears and Joseph Art and Luetta Nutting, and I couldn't find nothing available to suite the pt. I will fwd to Lorenda Hatchet, and see what she can work out. Pt daughter thanked me for the time and is aware that Lenna Sciara will be calling them with an appt.

## 2017-06-11 NOTE — Telephone Encounter (Signed)
    Chart reviewed as part of pre-operative protocol coverage. Because of Mendi Constable Berman's past medical history and time since last visit, he/she will require a follow-up visit in order to better assess preoperative cardiovascular risk. Their primary cardiologist is Dr. Curt Bears. Last visit was over almost 2 years ago in 08/2015.   Pre-op covering staff: - Please schedule appointment and call patient to inform them. - Please contact requesting surgeon's office via preferred method (i.e, phone, fax) to inform them of need for appointment prior to surgery.  Charlie Pitter, PA-C  06/11/2017, 9:31 AM

## 2017-06-12 ENCOUNTER — Other Ambulatory Visit: Payer: Self-pay | Admitting: Family Medicine

## 2017-06-16 ENCOUNTER — Encounter: Payer: Self-pay | Admitting: Family Medicine

## 2017-06-16 ENCOUNTER — Ambulatory Visit (INDEPENDENT_AMBULATORY_CARE_PROVIDER_SITE_OTHER): Payer: Medicare Other | Admitting: Family Medicine

## 2017-06-16 ENCOUNTER — Telehealth: Payer: Self-pay | Admitting: Physician Assistant

## 2017-06-16 ENCOUNTER — Other Ambulatory Visit: Payer: Self-pay

## 2017-06-16 VITALS — BP 116/64 | HR 72 | Temp 98.4°F | Ht 63.5 in | Wt 166.2 lb

## 2017-06-16 DIAGNOSIS — I471 Supraventricular tachycardia: Secondary | ICD-10-CM

## 2017-06-16 DIAGNOSIS — Z0000 Encounter for general adult medical examination without abnormal findings: Secondary | ICD-10-CM | POA: Diagnosis not present

## 2017-06-16 MED ORDER — FLUOCINONIDE 0.05 % EX OINT: 1.0000 "application " | TOPICAL_OINTMENT | Freq: Two times a day (BID) | CUTANEOUS | 1 refills | Status: DC

## 2017-06-16 NOTE — Progress Notes (Signed)
Dr. Frederico Hamman T. Gianfranco Araki, MD, [facility] [place], [address] Phone: [phone] Fax: [phone]  06/16/2017  Patient: Monica Irwin, MRN: [id number], DOB: 14-Mar-1946, 71 y.o.  Primary Physician:  Owens Loffler, MD   Chief Complaint  Patient presents with  . Annual Exam    Part 2  . Surgical Clearance   Subjective:   Monica Irwin is a 71 y.o. pleasant patient who presents with the following:  Health Maintenance Summary Reviewed and updated, unless pt declines services.  Tobacco History Reviewed. Non-smoker Alcohol: No concerns, no excessive use Exercise Habits: Some activity, rec at least 30 mins 5 times a week STD concerns: none Drug Use: None Birth control method: n/a Menses regular: n/a Lumps or breast concerns: no Breast Cancer Family History: no  Preoperative medical clearance for R THA:  Needs Card  Abnormal odor - L under arm every morning.  Odor   Health Maintenance  Topic Date Due  . MAMMOGRAM  01/02/2019  . COLONOSCOPY  10/29/2020  . TETANUS/TDAP  01/21/2027  . INFLUENZA VACCINE  Completed  . DEXA SCAN  Completed  . Hepatitis C Screening  Completed  . PNA vac Low Risk Adult  Completed    Immunization History  Administered Date(s) Administered  . Influenza Whole 04/10/2009, 03/13/2010  . Influenza,inj,Quad PF,6+ Mos 04/22/2015, 03/30/2016, 05/24/2017  . Influenza-Unspecified 04/12/2013  . Pneumococcal Conjugate-13 11/08/2013  . Pneumococcal Polysaccharide-23 05/22/2010  . Td 07/13/2001  . Tdap 01/20/2017  . Zoster 07/13/2001   Patient Active Problem List   Diagnosis Date Noted  . Mild cognitive impairment 10/19/2016  . Injury of right rotator cuff 08/20/2016  . History of colonic polyps 10/15/2015  . AVNRT (AV nodal re-entry tachycardia) ([place]) 08/19/2015  . Aortic calcification ([place]) 04/22/2015  . Major depressive disorder, recurrent episode, in full remission ([place])  07/24/2010  . OSTEOPOROSIS 07/24/2010  . INSOMNIA 08/29/2009  . HYPERLIPIDEMIA 12/19/2008  . HYPOTHYROIDISM 07/02/2008  . OSTEOARTHRITIS 07/02/2008  . NEPHROLITHIASIS, HX OF 07/02/2008   Past Medical History:  Diagnosis Date  . AVNRT (AV nodal re-entry tachycardia) ([place]) 08/19/2015   S/p successful ablation 07/2015.  [name] Headache   . History of colonic polyps   . Hypothyroidism   . Liver cyst   . Memory loss   . Nephrolithiasis   . Osteoarthritis   . Osteoporosis   . SVT (supraventricular tachycardia) ([place]) 05/07/2015   Past Surgical History:  Procedure Laterality Date  . BACK SURGERY     [phone]  . ELECTROPHYSIOLOGIC STUDY N/A 07/22/2015   Procedure: SVT Ablation;  Surgeon: Will Meredith Leeds, MD;  Location: [facility];  Service: Cardiovascular;  Laterality: N/A;  . PARTIAL COLECTOMY  2005  . SMALL INTESTINE SURGERY    . THYROIDECTOMY  2008   Social History   Socioeconomic History  . Marital status: Divorced    Spouse name: Not on file  . Number of children: 1  . Years of education: HS  . Highest education level: Not on file  Social Needs  . Financial resource strain: Not on file  . Food insecurity - worry: Not on file  . Food insecurity - inability: Not on file  . Transportation needs - medical: Not on file  . Transportation needs - non-medical: Not on file  Occupational History  . Occupation: retired [employer]: RETIRED  Tobacco Use  . Smoking status: Never Smoker  . Smokeless tobacco:  Never Used  Substance and Sexual Activity  . Alcohol use: No    Alcohol/week: 0.0 oz    Frequency: Never  . Drug use: No  . Sexual activity: No  Other Topics Concern  . Not on file  Social History Narrative   Regular exercise--no   Lives at home alone.   Right-handed.   1 cup caffeine daily.            Epworth Sleepiness Scale      Total: 4               Family History  Problem Relation Age of Onset  . Stroke  Mother   . Alzheimer's disease Mother   . Heart failure Brother   . Alzheimer's disease Sister   . Other Father        natural causes  . Heart attack Brother   . Colon cancer Unknown        nephew  . Ulcerative colitis Sister        colon removed   No Known Allergies  Medication list has been reviewed and updated.   General: Denies fever, chills, sweats. No significant weight loss. Eyes: Denies blurring,significant itching ENT: Denies earache, sore throat, and hoarseness.  Cardiovascular: Denies chest pains, palpitations, dyspnea on exertion,  Respiratory: Denies cough, dyspnea at rest,wheeezing Breast: no concerns about lumps GI: Denies nausea, vomiting, diarrhea, constipation, change in bowel habits, abdominal pain, melena, hematochezia GU: Denies dysuria, hematuria, urinary hesitancy, nocturia, denies STD risk, no concerns about discharge Musculoskeletal: Denies back pain, joint pain Derm: rash on scalp Neuro: Denies  paresthesias, frequent falls, frequent headaches Psych: Denies depression, anxiety Endocrine: Denies cold intolerance, heat intolerance, polydipsia Heme: Denies enlarged lymph nodes Allergy: No hayfever  Objective:   BP 116/64   Pulse 72   Temp 98.4 F (36.9 C) (Oral)   Ht 5' 3.5" (1.613 m)   Wt 166 lb 4 oz (75.4 kg)   BMI 28.99 kg/m  No exam data present  GEN: well developed, well nourished, no acute distress Eyes: conjunctiva and lids normal, PERRLA, EOMI ENT: TM clear, nares clear, oral exam WNL Neck: supple, no lymphadenopathy, no thyromegaly, no JVD Pulm: clear to auscultation and percussion, respiratory effort normal CV: regular rate and rhythm, S1-S2, no murmur, rub or gallop, no bruits Chest: no scars, masses, no lumps BREAST: breast exam declined GI: soft, non-tender; no hepatosplenomegaly, masses; active bowel sounds all quadrants GU: GU exam declined Lymph: no cervical, axillary or inguinal adenopathy MSK: gait normal, muscle tone  and strength WNL, no joint swelling, effusions, discoloration, crepitus  SKIN: posterior scalp, small area of scaly rash Neuro: normal mental status, normal strength, sensation, and motion Psych: alert; oriented to person, place and time, normally interactive and not anxious or depressed in appearance.   All labs reviewed with patient. Lipids:    Component Value Date/Time   CHOL 248 (H) 05/24/2017 1434   TRIG 142.0 05/24/2017 1434   HDL 60.00 05/24/2017 1434   LDLDIRECT 147.5 03/02/2012 0912   VLDL 28.4 05/24/2017 1434   CHOLHDL 4 05/24/2017 1434   CBC: CBC Latest Ref Rng & Units 10/19/2016 03/18/2016 11/11/2015  WBC 3.4 - 10.8 x10E3/uL 7.5 4.4 5.1  Hemoglobin 11.1 - 15.9 g/dL 13.3 14.8 14.1  Hematocrit 34.0 - 46.6 % 39.8 43.5 41.3  Platelets 150 - 379 x10E3/uL 309 211.0 465    Basic Metabolic Panel:    Component Value Date/Time   NA 139 05/24/2017 1434   NA 144  10/19/2016 1308   K 5.4 (H) 05/24/2017 1434   CL 103 05/24/2017 1434   CO2 28 05/24/2017 1434   BUN 28 (H) 05/24/2017 1434   BUN 22 10/19/2016 1308   CREATININE 0.83 05/24/2017 1434   CREATININE 0.79 07/16/2015 1228   GLUCOSE 109 (H) 05/24/2017 1434   CALCIUM 10.4 05/24/2017 1434   Hepatic Function Latest Ref Rng & Units 05/24/2017 10/19/2016 03/18/2016  Total Protein 6.0 - 8.3 g/dL 7.0 6.1 6.0  Albumin 3.5 - 5.2 g/dL 4.1 3.6 3.8  AST 0 - 37 U/L '20 14 19  ' ALT 0 - 35 U/L '24 12 21  ' Alk Phosphatase 39 - 117 U/L 75 68 76  Total Bilirubin 0.2 - 1.2 mg/dL 0.5 <0.2 0.4  Bilirubin, Direct 0.0 - 0.3 mg/dL 0.0 - 0.1    Lab Results  Component Value Date   TSH 1.180 10/19/2016   No results found.  Assessment and Plan:   Healthcare maintenance  AVNRT (AV nodal re-entry tachycardia) ([place]) - Plan: Ambulatory referral to Cardiology  From medical standpoint, the potential benefits of right-sided total hip replacement outweigh the potential risks.  Patient does need further cardiology clearance from a cardiac standpoint.   This appointment is pending at this time.  Health Maintenance Exam: The patient's preventative maintenance and recommended screening tests for an annual wellness exam were reviewed in full today. Brought up to date unless services declined.  Counselled on the importance of diet, exercise, and its role in overall health and mortality. The patient's FH and SH was reviewed, including their home life, tobacco status, and drug and alcohol status.  Follow-up in 1 year for physical exam or additional follow-up below.  Follow-up: No Follow-up on file. Or follow-up in 1 year if not noted.  Future Appointments  Date Time Provider [place]  07/16/2017 10:15 AM Baldwin Jamaica, PA-C [facility]    Meds ordered this encounter  Medications  . fluocinonide ointment (LIDEX) 0.05 %    Sig: Apply 1 application topically 2 (two) times daily.    Dispense:  60 g    Refill:  1   Orders Placed This Encounter  Procedures  . Ambulatory referral to Cardiology    Signed,  Frederico Hamman T. Erasmo Vertz, MD   Allergies as of 06/16/2017   No Known Allergies     Medication List        Accurate as of 06/16/17 11:59 PM. Always use your most recent med list.          donepezil 10 MG tablet Commonly known as:  ARICEPT Take 1 tablet (10 mg total) by mouth at bedtime.   fluocinonide ointment 0.05 % Commonly known as:  LIDEX Apply 1 application topically 2 (two) times daily.   levothyroxine 88 MCG tablet Commonly known as:  SYNTHROID, LEVOTHROID TAKE 1 TABLET BY MOUTH EVERY MORNING 30 MINUTES BEFORE BREAKFAST.   memantine 10 MG tablet Commonly known as:  NAMENDA Take 1 tablet (10 mg total) by mouth 2 (two) times daily.

## 2017-06-16 NOTE — Patient Instructions (Signed)

## 2017-06-16 NOTE — Telephone Encounter (Signed)
Cancel error

## 2017-07-01 ENCOUNTER — Encounter: Payer: Self-pay | Admitting: *Deleted

## 2017-07-14 NOTE — Progress Notes (Signed)
Cardiology Office Note Date:  07/16/2017  Patient ID:  Monica Irwin, Monica Irwin Monica Irwin, MRN [id number] PCP:  Owens Loffler, MD  Cardiologist:  Dr. Oval Linsey Electrophysiologist: Dr. Curt Bears    Chief Complaint: pre-operative evaluation  History of Present Illness: Monica Irwin is a 72 y.o. female with history of SVT s/p AVNRT ablation with Dr. Curt Bears on 07/22/15, hypothyroidism, osteoporosis, HLD diet controlled and monitored/managed with her PMD, early dementia.    She comes today to be seen for Dr. Curt Bears, last seen by him post procedure in Feb 2017.  At that time she was without recurrent palpitations, no procedural complications to be seen as needed going forward.  She is feeling quite well, outside of her orthopedic discomfort (chronic b/l cuff tears shoulders, and her hip pain), she feels like she is doing very well.  She comes today accompanied by her daughter who agrees.  The only change in her medical history is early dementia ans started on Aricept, no other new medical problems or change to her medical history, she confirms no known hx of CAD.  She has never been bothered by palpitations again post ablation, she denies any kind of CP, no SOB or DOE, no dizziness, near syncope or syncope are reported.  She remains very active and independent.  Push mows her lawn without difficulty (outside if her hip pain though she even states she just keeps going through this pain), she lives independently and cares for her home, yard, self.  She denies any exertional intolerances.   RCRI score is 0.4 DUKE activity index is 7.01METS   Past Medical History:  Diagnosis Date  . AVNRT (AV nodal re-entry tachycardia) ([place]) 08/19/2015   S/p successful ablation 07/2015.  [name] Headache   . History of colonic polyps   . Hypothyroidism   . Liver cyst   . Memory loss   . Nephrolithiasis   . Osteoarthritis   . Osteoporosis   . SVT (supraventricular tachycardia) ([place]) 05/07/2015    Past  Surgical History:  Procedure Laterality Date  . BACK SURGERY     [phone]  . ELECTROPHYSIOLOGIC STUDY N/A 07/22/2015   Procedure: SVT Ablation;  Surgeon: Will Meredith Leeds, MD;  Location: [facility];  Service: Cardiovascular;  Laterality: N/A;  . PARTIAL COLECTOMY  2005  . SMALL INTESTINE SURGERY    . THYROIDECTOMY  2008    Current Outpatient Medications  Medication Sig Dispense Refill  . donepezil (ARICEPT) 10 MG tablet Take 1 tablet (10 mg total) by mouth at bedtime. 90 tablet 4  . fluocinonide ointment (LIDEX) 5.79 % Apply 1 application topically 2 (two) times daily. 60 g 1  . levothyroxine (SYNTHROID, LEVOTHROID) 88 MCG tablet TAKE 1 TABLET BY MOUTH EVERY MORNING 30 MINUTES BEFORE BREAKFAST. 90 tablet 1  . memantine (NAMENDA) 10 MG tablet Take 1 tablet (10 mg total) by mouth 2 (two) times daily. 180 tablet 4   No current facility-administered medications for this visit.     Allergies:   Patient has no known allergies.   Social History:  The patient  reports that  has never smoked. she has never used smokeless tobacco. She reports that she does not drink alcohol or use drugs.   Family History:  The patient's family history includes Alzheimer's disease in her mother and sister; Colon cancer in her unknown relative; Heart attack in her brother; Heart failure in her brother; Other in her father; Stroke in her mother; Ulcerative colitis in her sister.  ROS:  Please see the history of present illness.  All other systems are reviewed and otherwise negative.   PHYSICAL EXAM:  VS:  BP 122/72   Pulse 68   Ht 5' 3.5" (1.613 m)   Wt 165 lb (74.8 kg)   BMI 28.77 kg/m  BMI: Body mass index is 28.77 kg/m. Well nourished, well developed, in no acute distress  HEENT: normocephalic, atraumatic  Neck: no JVD, carotid bruits or masses Cardiac: RRR; no murmurs, no rubs, or gallops Lungs:  CTA b/l, no wheezing, rhonchi or rales  Abd: soft, nontender MS: no deformity, age  appropriate atrophy Ext: no edema  Skin: warm and dry, no rash Neuro:  No gross deficits appreciated Psych: euthymic mood, full affect   EKG:  Done today and reviewed by myself: SR 68bpm, PR 151m, QRS 823m QTc 44246mis unchanged from previous  06/20/15: Stress myoview:   Nuclear stress EF: 69%.  The left ventricular ejection fraction is hyperdynamic (>65%).  There was no ST segment deviation noted during stress.  The study is normal.  This is a low risk study. Normal exercise myocardial perfusion study. Nl LV function; EF 69%. Patient exercised to a 7 met workload and 94% APMHR without chest pain or ECG changes of ischemia. Rare PAC in recovery.   05/15/15: TTE Study Conclusions - Left ventricle: The cavity size was normal. Wall thickness was   normal. Systolic function was normal. The estimated ejection   fraction was in the range of 60% to 65%. Wall motion was normal;   there were no regional wall motion abnormalities. Doppler   parameters are consistent with abnormal left ventricular   relaxation (grade 1 diastolic dysfunction). - Mitral valve: Calcified annulus. - Pulmonary arteries: Systolic pressure was mildly increased. PA   peak pressure: 33 mm Hg (S). Impressions: - Normal LV systolic function; grade 1 diastolic dysfunction; trace   MR; mild TR; mildly elevated pulmonary pressure.  Recent Labs: 10/19/2016: Hemoglobin 13.3; Platelets 309; TSH 1.180 05/24/2017: ALT 24; BUN 28; Creatinine, Ser 0.83; Potassium 5.4; Sodium 139  05/24/2017: Cholesterol 248; HDL 60.00; LDL Cholesterol 160; Total CHOL/HDL Ratio 4; Triglycerides 142.0; VLDL 28.4   CrCl cannot be calculated (Patient's most recent lab result is older than the maximum 21 days allowed.).   Wt Readings from Last 3 Encounters:  07/16/17 165 lb (74.8 kg)  06/16/17 166 lb 4 oz (75.4 kg)  05/24/17 162 lb 8 oz (73.7 kg)     Other studies reviewed: Additional studies/records reviewed today include: summarized  above  ASSESSMENT AND PLAN:  1. SVT, AVNRT ablated 2017 w/Dr. CamCurt Bears  Palpitations remain resolved  2. Pre-op RCRI score is 0.4 DUKE activity index is 7.01METS Based on ACC/AHA guidelines, [name] be at acceptable risk for the planned procedure without further cardiovascular testing.    Disposition: F/u with us [place]  Current medicines are reviewed at length with the patient today.  The patient did not have any concerns regarding medicines.  SigVenetia NightA-C 07/16/2017 10:37 AM     [facility] [address] [place] [phone])  [phone])

## 2017-07-16 ENCOUNTER — Ambulatory Visit (INDEPENDENT_AMBULATORY_CARE_PROVIDER_SITE_OTHER): Payer: Medicare Other | Admitting: Physician Assistant

## 2017-07-16 VITALS — BP 122/72 | HR 68 | Ht 63.5 in | Wt 165.0 lb

## 2017-07-16 DIAGNOSIS — I471 Supraventricular tachycardia: Secondary | ICD-10-CM

## 2017-07-16 DIAGNOSIS — Z01818 Encounter for other preprocedural examination: Secondary | ICD-10-CM | POA: Diagnosis not present

## 2017-07-16 NOTE — Patient Instructions (Signed)

## 2017-07-26 ENCOUNTER — Encounter (HOSPITAL_COMMUNITY): Payer: Self-pay | Admitting: *Deleted

## 2017-07-27 ENCOUNTER — Ambulatory Visit: Payer: Self-pay | Admitting: Orthopedic Surgery

## 2017-07-30 NOTE — Patient Instructions (Addendum)
Monica Irwin  07/30/2017   Your procedure is scheduled on: 08/19/2017   Report to [facility] Main  Entrance  Follow signs to Short Stay on first floor at 530 AM  Call this number if you have problems the morning of surgery [phone]   Remember: Do not eat food or drink liquids :After Midnight.     Take these medicines the morning of surgery with A SIP OF WATER: Synthroid , Namenda                                 You may not have any metal on your body including hair pins and              piercings  Do not wear jewelry, make-up, lotions, powders or perfumes, deodorant             Do not wear nail polish.  Do not shave  48 hours prior to surgery.               Do not bring valuables to the hospital. [place].  Contacts, dentures or bridgework may not be worn into surgery.  Leave suitcase in the car. After surgery it may be brought to your room.                       Please read over the following fact sheets you were given: _____________________________________________________________________             [facility] - Preparing for Surgery Before surgery, you can play an important role.  Because skin is not sterile, your skin needs to be as free of germs as possible.  You can reduce the number of germs on your skin by washing with CHG (chlorahexidine gluconate) soap before surgery.  CHG is an antiseptic cleaner which kills germs and bonds with the skin to continue killing germs even after washing. Please DO NOT use if you have an allergy to CHG or antibacterial soaps.  If your skin becomes reddened/irritated stop using the CHG and inform your nurse when you arrive at Short Stay. Do not shave (including legs and underarms) for at least 48 hours prior to the first CHG shower.  You may shave your face/neck. Please follow these instructions carefully:  1.  Shower with CHG Soap the night before surgery  and the  morning of Surgery.  2.  If you choose to wash your hair, wash your hair first as usual with your  normal  shampoo.  3.  After you shampoo, rinse your hair and body thoroughly to remove the  shampoo.                           4.  Use CHG as you would any other liquid soap.  You can apply chg directly  to the skin and wash                       Gently with a scrungie or clean washcloth.  5.  Apply the CHG Soap to your body ONLY FROM THE NECK DOWN.   Do not use on face/ open  Wound or open sores. Avoid contact with eyes, ears mouth and genitals (private parts).                       Wash face,  Genitals (private parts) with your normal soap.             6.  Wash thoroughly, paying special attention to the area where your surgery  will be performed.  7.  Thoroughly rinse your body with warm water from the neck down.  8.  DO NOT shower/wash with your normal soap after using and rinsing off  the CHG Soap.                9.  Pat yourself dry with a clean towel.            10.  Wear clean pajamas.            11.  Place clean sheets on your bed the night of your first shower and do not  sleep with pets. Day of Surgery : Do not apply any lotions/deodorants the morning of surgery.  Please wear clean clothes to the hospital/surgery center.  FAILURE TO FOLLOW THESE INSTRUCTIONS MAY RESULT IN THE CANCELLATION OF YOUR SURGERY PATIENT SIGNATURE_________________________________  NURSE SIGNATURE__________________________________  ________________________________________________________________________  WHAT IS A BLOOD TRANSFUSION? Blood Transfusion Information  A transfusion is the replacement of blood or some of its parts. Blood is made up of multiple cells which provide different functions.  Red blood cells carry oxygen and are used for blood loss replacement.  White blood cells fight against infection.  Platelets control bleeding.  Plasma helps clot  blood.  Other blood products are available for specialized needs, such as hemophilia or other clotting disorders. BEFORE THE TRANSFUSION  Who gives blood for transfusions?   Healthy volunteers who are fully evaluated to make sure their blood is safe. This is blood bank blood. Transfusion therapy is the safest it has ever been in the practice of medicine. Before blood is taken from a donor, a complete history is taken to make sure that person has no history of diseases nor engages in risky social behavior (examples are intravenous drug use or sexual activity with multiple partners). The donor's travel history is screened to minimize risk of transmitting infections, such as malaria. The donated blood is tested for signs of infectious diseases, such as HIV and hepatitis. The blood is then tested to be sure it is compatible with you in order to minimize the chance of a transfusion reaction. If you or a relative donates blood, this is often done in anticipation of surgery and is not appropriate for emergency situations. It takes many days to process the donated blood. RISKS AND COMPLICATIONS Although transfusion therapy is very safe and saves many lives, the main dangers of transfusion include:   Getting an infectious disease.  Developing a transfusion reaction. This is an allergic reaction to something in the blood you were given. Every precaution is taken to prevent this. The decision to have a blood transfusion has been considered carefully by your caregiver before blood is given. Blood is not given unless the benefits outweigh the risks. AFTER THE TRANSFUSION  Right after receiving a blood transfusion, you will usually feel much better and more energetic. This is especially true if your red blood cells have gotten low (anemic). The transfusion raises the level of the red blood cells which carry oxygen, and this usually causes an energy increase.  The  nurse administering the transfusion will monitor  you carefully for complications. HOME CARE INSTRUCTIONS  No special instructions are needed after a transfusion. You may find your energy is better. Speak with your caregiver about any limitations on activity for underlying diseases you may have. SEEK MEDICAL CARE IF:   Your condition is not improving after your transfusion.  You develop redness or irritation at the intravenous (IV) site. SEEK IMMEDIATE MEDICAL CARE IF:  Any of the following symptoms occur over the next 12 hours:  Shaking chills.  You have a temperature by mouth above 102 F (38.9 C), not controlled by medicine.  Chest, back, or muscle pain.  People around you feel you are not acting correctly or are confused.  Shortness of breath or difficulty breathing.  Dizziness and fainting.  You get a rash or develop hives.  You have a decrease in urine output.  Your urine turns a dark color or changes to pink, red, or brown. Any of the following symptoms occur over the next 10 days:  You have a temperature by mouth above 102 F (38.9 C), not controlled by medicine.  Shortness of breath.  Weakness after normal activity.  The white part of the eye turns yellow (jaundice).  You have a decrease in the amount of urine or are urinating less often.  Your urine turns a dark color or changes to pink, red, or brown. Document Released: 06/26/2000 Document Revised: 09/21/2011 Document Reviewed: 02/13/2008 ExitCare Patient Information 2014 [place].  _______________________________________________________________________  Incentive Spirometer  An incentive spirometer is a tool that can help keep your lungs clear and active. This tool measures how well you are filling your lungs with each breath. Taking long deep breaths may help reverse or decrease the chance of developing breathing (pulmonary) problems (especially infection) following:  A long period of time when you are unable to move or be active. BEFORE THE  PROCEDURE   If the spirometer includes an indicator to show your best effort, your nurse or respiratory therapist will set it to a desired goal.  If possible, sit up straight or lean slightly forward. Try not to slouch.  Hold the incentive spirometer in an upright position. INSTRUCTIONS FOR USE  1. Sit on the edge of your bed if possible, or sit up as far as you can in bed or on a chair. 2. Hold the incentive spirometer in an upright position. 3. Breathe out normally. 4. Place the mouthpiece in your mouth and seal your lips tightly around it. 5. Breathe in slowly and as deeply as possible, raising the piston or the ball toward the top of the column. 6. Hold your breath for 3-5 seconds or for as long as possible. Allow the piston or ball to fall to the bottom of the column. 7. Remove the mouthpiece from your mouth and breathe out normally. 8. Rest for a few seconds and repeat Steps 1 through 7 at least 10 times every 1-2 hours when you are awake. Take your time and take a few normal breaths between deep breaths. 9. The spirometer may include an indicator to show your best effort. Use the indicator as a goal to work toward during each repetition. 10. After each set of 10 deep breaths, practice coughing to be sure your lungs are clear. If you have an incision (the cut made at the time of surgery), support your incision when coughing by placing a pillow or rolled up towels firmly against it. Once you are able to get  out of bed, walk around indoors and cough well. You may stop using the incentive spirometer when instructed by your caregiver.  RISKS AND COMPLICATIONS  Take your time so you do not get dizzy or light-headed.  If you are in pain, you may need to take or ask for pain medication before doing incentive spirometry. It is harder to take a deep breath if you are having pain. AFTER USE  Rest and breathe slowly and easily.  It can be helpful to keep track of a log of your progress. Your  caregiver can provide you with a simple table to help with this. If you are using the spirometer at home, follow these instructions: [place] IF:   You are having difficultly using the spirometer.  You have trouble using the spirometer as often as instructed.  Your pain medication is not giving enough relief while using the spirometer.  You develop fever of 100.5 F (38.1 C) or higher. SEEK IMMEDIATE MEDICAL CARE IF:   You cough up bloody sputum that had not been present before.  You develop fever of 102 F (38.9 C) or greater.  You develop worsening pain at or near the incision site. MAKE SURE YOU:   Understand these instructions.  Will watch your condition.  Will get help right away if you are not doing well or get worse. Document Released: 11/09/2006 Document Revised: 09/21/2011 Document Reviewed: 01/10/2007 [employer] Patient Information 2014 [place], [place].   ________________________________________________________________________

## 2017-08-03 ENCOUNTER — Other Ambulatory Visit: Payer: Self-pay

## 2017-08-03 ENCOUNTER — Encounter (HOSPITAL_COMMUNITY)
Admission: RE | Admit: 2017-08-03 | Discharge: 2017-08-03 | Disposition: A | Discharge status: Home / Self Care | Payer: Medicare Other | Source: Ambulatory Visit | Attending: Orthopedic Surgery | Admitting: Orthopedic Surgery

## 2017-08-03 ENCOUNTER — Encounter (HOSPITAL_COMMUNITY): Payer: Self-pay

## 2017-08-03 ENCOUNTER — Ambulatory Visit: Payer: Self-pay | Admitting: Orthopedic Surgery

## 2017-08-03 DIAGNOSIS — M1611 Unilateral primary osteoarthritis, right hip: Secondary | ICD-10-CM | POA: Diagnosis not present

## 2017-08-03 DIAGNOSIS — Z01812 Encounter for preprocedural laboratory examination: Secondary | ICD-10-CM | POA: Diagnosis present

## 2017-08-03 HISTORY — DX: Personal history of urinary calculi: Z87.442

## 2017-08-03 HISTORY — DX: Cardiac arrhythmia, unspecified: I49.9

## 2017-08-03 LAB — BASIC METABOLIC PANEL
Anion gap: 8 (ref 5–15)
BUN: 24 mg/dL — AB (ref 6–20)
CHLORIDE: 102 mmol/L (ref 101–111)
CO2: 27 mmol/L (ref 22–32)
Calcium: 9.6 mg/dL (ref 8.9–10.3)
Creatinine, Ser: 1 mg/dL (ref 0.44–1.00)
GFR calc Af Amer: >60 mL/min (ref >60)
GFR calc non Af Amer: 55 mL/min — ABNORMAL LOW (ref >60)
Glucose, Bld: 122 mg/dL — ABNORMAL HIGH (ref 65–99)
POTASSIUM: 4.6 mmol/L (ref 3.5–5.1)
SODIUM: 137 mmol/L (ref 135–145)

## 2017-08-03 LAB — SURGICAL PCR SCREEN
MRSA, PCR: NEGATIVE
Staphylococcus aureus: NEGATIVE

## 2017-08-03 LAB — CBC
HEMATOCRIT: 46.2 % — AB (ref 36.0–46.0)
HEMOGLOBIN: 15.4 g/dL — AB (ref 12.0–15.0)
MCH: 31.4 pg (ref 26.0–34.0)
MCHC: 33.3 g/dL (ref 30.0–36.0)
MCV: 94.3 fL (ref 78.0–100.0)
Platelets: 248 10*3/uL (ref 150–400)
RBC: 4.9 MIL/uL (ref 3.87–5.11)
RDW: 13 % (ref 11.5–15.5)
WBC: 6.8 10*3/uL (ref 4.0–10.5)

## 2017-08-03 NOTE — H&P (View-Only) (Signed)
TOTAL HIP ADMISSION H&P  Patient is admitted for right total hip arthroplasty.  Subjective:  Chief Complaint: right hip pain  HPI: Monica Irwin, 72 y.o. female, has a history of pain and functional disability in the right hip(s) due to arthritis and patient has failed non-surgical conservative treatments for greater than 12 weeks to include NSAID's and/or analgesics, flexibility and strengthening excercises, supervised PT with diminished ADL's post treatment, use of assistive devices and activity modification.  Onset of symptoms was gradual starting 3 years ago with rapidlly worsening course since that time.The patient noted no past surgery on the right hip(s).  Patient currently rates pain in the right hip at 10 out of 10 with activity. Patient has night pain, worsening of pain with activity and weight bearing, trendelenberg gait and pain with passive range of motion. Patient has evidence of subchondral cysts, subchondral sclerosis, periarticular osteophytes and joint space narrowing by imaging studies. This condition presents safety issues increasing the risk of falls.  There is no current active infection.  Patient Active Problem List   Diagnosis Date Noted  . Mild cognitive impairment 10/19/2016  . Injury of right rotator cuff 08/20/2016  . History of colonic polyps 10/15/2015  . 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 Aortic calcification ([place]) 04/22/2015  . Major depressive disorder, recurrent episode, in full remission ([place]) 07/24/2010  . OSTEOPOROSIS 07/24/2010  . INSOMNIA 08/29/2009  . HYPERLIPIDEMIA 12/19/2008  . HYPOTHYROIDISM 07/02/2008  . OSTEOARTHRITIS 07/02/2008  . NEPHROLITHIASIS, HX OF 07/02/2008   Past Medical History:  Diagnosis Date  . 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  [name] Headache   . History of colonic polyps   . Hypothyroidism   . Liver cyst   . Memory loss   . Nephrolithiasis   .  Osteoarthritis   . Osteoporosis   . SVT (supraventricular tachycardia) ([place]) 05/07/2015    Past Surgical History:  Procedure Laterality Date  . BACK SURGERY     [phone]  . ELECTROPHYSIOLOGIC STUDY N/A 07/22/2015   Procedure: SVT Ablation;  Surgeon: Will Meredith Leeds, MD;  Location: [facility];  Service: Cardiovascular;  Laterality: N/A;  . PARTIAL COLECTOMY  2005  . SMALL INTESTINE SURGERY    . THYROIDECTOMY  2008    Current Outpatient Medications  Medication Sig Dispense Refill Last Dose  . donepezil (ARICEPT) 10 MG tablet Take 1 tablet (10 mg total) by mouth at bedtime. 90 tablet 4 Taking  . fluocinonide ointment (LIDEX) 9.56 % Apply 1 application topically 2 (two) times daily. (Patient taking differently: Apply 1 application topically 2 (two) times daily as needed (eczema). ) 60 g 1 Taking  . ibuprofen (ADVIL,MOTRIN) 200 MG tablet Take 400 mg by mouth daily as needed for moderate pain.     [name] levothyroxine (SYNTHROID, LEVOTHROID) 88 MCG tablet TAKE 1 TABLET BY MOUTH EVERY MORNING 30 MINUTES BEFORE BREAKFAST. 90 tablet 1 Taking  . memantine (NAMENDA) 10 MG tablet Take 1 tablet (10 mg total) by mouth 2 (two) times daily. 180 tablet 4 Taking  . naproxen sodium (ALEVE) 220 MG tablet Take 220 mg by mouth daily as needed (pain).      No current facility-administered medications for this visit.    No Known Allergies  Social History   Tobacco Use  . Smoking status: Never Smoker  . Smokeless tobacco: Never Used  Substance Use Topics  . Alcohol use: No    Alcohol/week: 0.0 oz  Frequency: Never    Family History  Problem Relation Age of Onset  . Stroke Mother   . Alzheimer's disease Mother   . Heart failure Brother   . Alzheimer's disease Sister   . Other Father        natural causes  . Heart attack Brother   . Colon cancer Unknown        nephew  . Ulcerative colitis Sister        colon removed     Review of Systems  Constitutional: Negative.   HENT:  Negative.   Eyes: Negative.   Respiratory: Negative.   Cardiovascular: Negative.   Gastrointestinal: Negative.   Genitourinary: Negative.   Musculoskeletal: Positive for joint pain.  Skin: Negative.   Neurological: Negative.   Endo/Heme/Allergies: Negative.   Psychiatric/Behavioral: Positive for memory loss.    Objective:  Physical Exam  Vitals reviewed. Constitutional: She is oriented to person, place, and time. She appears well-developed and well-nourished.  HENT:  Head: Normocephalic and atraumatic.  Eyes: Conjunctivae and EOM are normal. Pupils are equal, round, and reactive to light.  Neck: Normal range of motion. No thyromegaly present.  Cardiovascular: Normal rate, regular rhythm and intact distal pulses.  Respiratory: Effort normal. No respiratory distress.  GI: Soft. She exhibits no distension.  Genitourinary:  Genitourinary Comments: deferrred  Musculoskeletal:       Right hip: She exhibits decreased range of motion and bony tenderness.  Neurological: She is alert and oriented to person, place, and time. She has normal reflexes.  Skin: Skin is warm and dry.  Psychiatric: She has a normal mood and affect. Her behavior is normal. Judgment and thought content normal.    Vital signs in last 24 hours: @VSRANGES @  Labs:   Estimated body mass index is 28.77 kg/m as calculated from the following:   Height as of 07/16/17: 5' 3.5" (1.613 m).   Weight as of 07/16/17: 74.8 kg (165 lb).   Imaging Review Plain radiographs demonstrate severe degenerative joint disease of the bilateral hip(s). The bone quality appears to be adequate for age and reported activity level.  Assessment/Plan:  End stage arthritis, right hip(s)  The patient history, physical examination, clinical judgement of the provider and imaging studies are consistent with end stage degenerative joint disease of the right hip(s) and total hip arthroplasty is deemed medically necessary. The treatment options  including medical management, injection therapy, arthroscopy and arthroplasty were discussed at length. The risks and benefits of total hip arthroplasty were presented and reviewed. The risks due to aseptic loosening, infection, stiffness, dislocation/subluxation,  thromboembolic complications and other imponderables were discussed.  The patient acknowledged the explanation, agreed to proceed with the plan and consent was signed. Patient is being admitted for inpatient treatment for surgery, pain control, PT, OT, prophylactic antibiotics, VTE prophylaxis, progressive ambulation and ADL's and discharge planning.The patient is planning to be discharged to skilled [place].

## 2017-08-03 NOTE — Progress Notes (Signed)
BMP done 08/03/17 faxed via epic to DR [facility].

## 2017-08-03 NOTE — H&P (Signed)
TOTAL HIP ADMISSION H&P  Patient is admitted for right total hip arthroplasty.  Subjective:  Chief Complaint: right hip pain  HPI: Monica Irwin, 72 y.o. female, has a history of pain and functional disability in the right hip(s) due to arthritis and patient has failed non-surgical conservative treatments for greater than 12 weeks to include NSAID's and/or analgesics, flexibility and strengthening excercises, supervised PT with diminished ADL's post treatment, use of assistive devices and activity modification.  Onset of symptoms was gradual starting 3 years ago with rapidlly worsening course since that time.The patient noted no past surgery on the right hip(s).  Patient currently rates pain in the right hip at 10 out of 10 with activity. Patient has night pain, worsening of pain with activity and weight bearing, trendelenberg gait and pain with passive range of motion. Patient has evidence of subchondral cysts, subchondral sclerosis, periarticular osteophytes and joint space narrowing by imaging studies. This condition presents safety issues increasing the risk of falls.  There is no current active infection.  Patient Active Problem List   Diagnosis Date Noted  . Mild cognitive impairment 10/19/2016  . Injury of right rotator cuff 08/20/2016  . History of colonic polyps 10/15/2015  . 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 Aortic calcification ([place]) 04/22/2015  . Major depressive disorder, recurrent episode, in full remission ([place]) 07/24/2010  . OSTEOPOROSIS 07/24/2010  . INSOMNIA 08/29/2009  . HYPERLIPIDEMIA 12/19/2008  . HYPOTHYROIDISM 07/02/2008  . OSTEOARTHRITIS 07/02/2008  . NEPHROLITHIASIS, HX OF 07/02/2008   Past Medical History:  Diagnosis Date  . 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  [name] Headache   . History of colonic polyps   . Hypothyroidism   . Liver cyst   . Memory loss   . Nephrolithiasis   .  Osteoarthritis   . Osteoporosis   . SVT (supraventricular tachycardia) ([place]) 05/07/2015    Past Surgical History:  Procedure Laterality Date  . BACK SURGERY     [phone]  . ELECTROPHYSIOLOGIC STUDY N/A 07/22/2015   Procedure: SVT Ablation;  Surgeon: Will Meredith Leeds, MD;  Location: [facility];  Service: Cardiovascular;  Laterality: N/A;  . PARTIAL COLECTOMY  2005  . SMALL INTESTINE SURGERY    . THYROIDECTOMY  2008    Current Outpatient Medications  Medication Sig Dispense Refill Last Dose  . donepezil (ARICEPT) 10 MG tablet Take 1 tablet (10 mg total) by mouth at bedtime. 90 tablet 4 Taking  . fluocinonide ointment (LIDEX) 5.03 % Apply 1 application topically 2 (two) times daily. (Patient taking differently: Apply 1 application topically 2 (two) times daily as needed (eczema). ) 60 g 1 Taking  . ibuprofen (ADVIL,MOTRIN) 200 MG tablet Take 400 mg by mouth daily as needed for moderate pain.     [name] levothyroxine (SYNTHROID, LEVOTHROID) 88 MCG tablet TAKE 1 TABLET BY MOUTH EVERY MORNING 30 MINUTES BEFORE BREAKFAST. 90 tablet 1 Taking  . memantine (NAMENDA) 10 MG tablet Take 1 tablet (10 mg total) by mouth 2 (two) times daily. 180 tablet 4 Taking  . naproxen sodium (ALEVE) 220 MG tablet Take 220 mg by mouth daily as needed (pain).      No current facility-administered medications for this visit.    No Known Allergies  Social History   Tobacco Use  . Smoking status: Never Smoker  . Smokeless tobacco: Never Used  Substance Use Topics  . Alcohol use: No    Alcohol/week: 0.0 oz  Frequency: Never    Family History  Problem Relation Age of Onset  . Stroke Mother   . Alzheimer's disease Mother   . Heart failure Brother   . Alzheimer's disease Sister   . Other Father        natural causes  . Heart attack Brother   . Colon cancer Unknown        nephew  . Ulcerative colitis Sister        colon removed     Review of Systems  Constitutional: Negative.   HENT:  Negative.   Eyes: Negative.   Respiratory: Negative.   Cardiovascular: Negative.   Gastrointestinal: Negative.   Genitourinary: Negative.   Musculoskeletal: Positive for joint pain.  Skin: Negative.   Neurological: Negative.   Endo/Heme/Allergies: Negative.   Psychiatric/Behavioral: Positive for memory loss.    Objective:  Physical Exam  Vitals reviewed. Constitutional: She is oriented to person, place, and time. She appears well-developed and well-nourished.  HENT:  Head: Normocephalic and atraumatic.  Eyes: Conjunctivae and EOM are normal. Pupils are equal, round, and reactive to light.  Neck: Normal range of motion. No thyromegaly present.  Cardiovascular: Normal rate, regular rhythm and intact distal pulses.  Respiratory: Effort normal. No respiratory distress.  GI: Soft. She exhibits no distension.  Genitourinary:  Genitourinary Comments: deferrred  Musculoskeletal:       Right hip: She exhibits decreased range of motion and bony tenderness.  Neurological: She is alert and oriented to person, place, and time. She has normal reflexes.  Skin: Skin is warm and dry.  Psychiatric: She has a normal mood and affect. Her behavior is normal. Judgment and thought content normal.    Vital signs in last 24 hours: @VSRANGES @  Labs:   Estimated body mass index is 28.77 kg/m as calculated from the following:   Height as of 07/16/17: 5' 3.5" (1.613 m).   Weight as of 07/16/17: 74.8 kg (165 lb).   Imaging Review Plain radiographs demonstrate severe degenerative joint disease of the bilateral hip(s). The bone quality appears to be adequate for age and reported activity level.  Assessment/Plan:  End stage arthritis, right hip(s)  The patient history, physical examination, clinical judgement of the provider and imaging studies are consistent with end stage degenerative joint disease of the right hip(s) and total hip arthroplasty is deemed medically necessary. The treatment options  including medical management, injection therapy, arthroscopy and arthroplasty were discussed at length. The risks and benefits of total hip arthroplasty were presented and reviewed. The risks due to aseptic loosening, infection, stiffness, dislocation/subluxation,  thromboembolic complications and other imponderables were discussed.  The patient acknowledged the explanation, agreed to proceed with the plan and consent was signed. Patient is being admitted for inpatient treatment for surgery, pain control, PT, OT, prophylactic antibiotics, VTE prophylaxis, progressive ambulation and ADL's and discharge planning.The patient is planning to be discharged to skilled [facility].

## 2017-08-03 NOTE — Progress Notes (Signed)
DR Krista Blue- 06/10/17-clearance on chart 07/16/17-preop clearance- cardiology- Charlcie Cradle, PA-epic  EKG-07/16/17-epic  06/17/17-Dr Frederico Hamman [place]- clearance

## 2017-08-18 MED ORDER — TRANEXAMIC ACID 1000 MG/10ML IV SOLN
1000.0000 mg | INTRAVENOUS | Status: AC
  Administered 2017-08-19: 1000 mg via INTRAVENOUS
  Filled 2017-08-18: qty 1100

## 2017-08-18 NOTE — Progress Notes (Signed)
Called and spoke to patient's daughter Monica Irwin and informed  Her of time change for surgery. Patient to arrive at Admitting at 0945 am and nothing to eat or drink after midnight  and may take medications with a sip of water morning of surgery. Daughter, Monica Irwin verbalized understanding.

## 2017-08-19 ENCOUNTER — Other Ambulatory Visit: Payer: Self-pay

## 2017-08-19 ENCOUNTER — Inpatient Hospital Stay (HOSPITAL_COMMUNITY): Payer: Medicare Other | Admitting: Registered Nurse

## 2017-08-19 ENCOUNTER — Inpatient Hospital Stay (HOSPITAL_COMMUNITY): Payer: Medicare Other

## 2017-08-19 ENCOUNTER — Inpatient Hospital Stay (HOSPITAL_COMMUNITY)
Admission: RE | Admit: 2017-08-19 | Discharge: 2017-08-21 | DRG: 470 | Disposition: A | Discharge status: Skilled Nursing Facility | Payer: Medicare Other | Source: Ambulatory Visit | Attending: Orthopedic Surgery | Admitting: Orthopedic Surgery

## 2017-08-19 ENCOUNTER — Encounter (HOSPITAL_COMMUNITY): Payer: Self-pay | Admitting: Emergency Medicine

## 2017-08-19 ENCOUNTER — Encounter (HOSPITAL_COMMUNITY)
Admission: RE | Disposition: A | Discharge status: Skilled Nursing Facility | Payer: Self-pay | Source: Ambulatory Visit | Attending: Orthopedic Surgery

## 2017-08-19 DIAGNOSIS — M810 Age-related osteoporosis without current pathological fracture: Secondary | ICD-10-CM | POA: Diagnosis present

## 2017-08-19 DIAGNOSIS — M1611 Unilateral primary osteoarthritis, right hip: Principal | ICD-10-CM | POA: Diagnosis present

## 2017-08-19 DIAGNOSIS — Z823 Family history of stroke: Secondary | ICD-10-CM | POA: Diagnosis not present

## 2017-08-19 DIAGNOSIS — Z419 Encounter for procedure for purposes other than remedying health state, unspecified: Secondary | ICD-10-CM

## 2017-08-19 DIAGNOSIS — Z09 Encounter for follow-up examination after completed treatment for conditions other than malignant neoplasm: Secondary | ICD-10-CM

## 2017-08-19 DIAGNOSIS — Z79890 Hormone replacement therapy (postmenopausal): Secondary | ICD-10-CM | POA: Diagnosis not present

## 2017-08-19 DIAGNOSIS — Z9049 Acquired absence of other specified parts of digestive tract: Secondary | ICD-10-CM | POA: Diagnosis not present

## 2017-08-19 DIAGNOSIS — E890 Postprocedural hypothyroidism: Secondary | ICD-10-CM | POA: Diagnosis present

## 2017-08-19 DIAGNOSIS — M25551 Pain in right hip: Secondary | ICD-10-CM | POA: Diagnosis present

## 2017-08-19 HISTORY — PX: TOTAL HIP ARTHROPLASTY: SHX124

## 2017-08-19 LAB — TYPE AND SCREEN
ABO/RH(D): A POS
Antibody Screen: NEGATIVE

## 2017-08-19 LAB — ABO/RH: ABO/RH(D): A POS

## 2017-08-19 SURGERY — ARTHROPLASTY, HIP, TOTAL, ANTERIOR APPROACH
Anesthesia: Spinal | Site: Hip | Laterality: Right

## 2017-08-19 MED ORDER — ONDANSETRON HCL 4 MG/2ML IJ SOLN
INTRAMUSCULAR | Status: DC | PRN
  Administered 2017-08-19: 4 mg via INTRAVENOUS

## 2017-08-19 MED ORDER — HYDROCODONE-ACETAMINOPHEN 5-325 MG PO TABS
2.0000 | ORAL_TABLET | ORAL | Status: DC | PRN
  Administered 2017-08-19 – 2017-08-20 (×2): 2 via ORAL
  Filled 2017-08-19: qty 2

## 2017-08-19 MED ORDER — EPHEDRINE 5 MG/ML INJ
INTRAVENOUS | Status: AC
  Filled 2017-08-19: qty 10

## 2017-08-19 MED ORDER — SODIUM CHLORIDE 0.9 % IR SOLN
Status: DC | PRN
  Administered 2017-08-19: 4000 mL

## 2017-08-19 MED ORDER — CHLORHEXIDINE GLUCONATE 4 % EX LIQD: 60.0000 mL | Freq: Once | CUTANEOUS | Status: DC

## 2017-08-19 MED ORDER — BUPIVACAINE-EPINEPHRINE 0.25% -1:200000 IJ SOLN
INTRAMUSCULAR | Status: DC | PRN
  Administered 2017-08-19: 30 mL

## 2017-08-19 MED ORDER — METHOCARBAMOL 500 MG PO TABS: 500.0000 mg | ORAL_TABLET | Freq: Four times a day (QID) | ORAL | Status: DC | PRN

## 2017-08-19 MED ORDER — FENTANYL CITRATE (PF) 100 MCG/2ML IJ SOLN
INTRAMUSCULAR | Status: AC
  Filled 2017-08-19: qty 2

## 2017-08-19 MED ORDER — METHOCARBAMOL 1000 MG/10ML IJ SOLN
500.0000 mg | Freq: Four times a day (QID) | INTRAMUSCULAR | Status: DC | PRN
  Filled 2017-08-19: qty 5

## 2017-08-19 MED ORDER — KETOROLAC TROMETHAMINE 30 MG/ML IJ SOLN
INTRAMUSCULAR | Status: DC | PRN
  Administered 2017-08-19: 30 mg

## 2017-08-19 MED ORDER — DONEPEZIL HCL 10 MG PO TABS
10.0000 mg | ORAL_TABLET | Freq: Every day | ORAL | Status: DC
  Administered 2017-08-19 – 2017-08-20 (×2): 10 mg via ORAL
  Filled 2017-08-19 (×2): qty 1

## 2017-08-19 MED ORDER — DIPHENHYDRAMINE HCL 12.5 MG/5ML PO ELIX: 12.5000 mg | ORAL_SOLUTION | ORAL | Status: DC | PRN

## 2017-08-19 MED ORDER — LACTATED RINGERS IV SOLN
INTRAVENOUS | Status: DC
  Administered 2017-08-19: 11:00:00 via INTRAVENOUS

## 2017-08-19 MED ORDER — ONDANSETRON HCL 4 MG PO TABS: 4.0000 mg | ORAL_TABLET | Freq: Three times a day (TID) | ORAL | 0 refills | Status: DC | PRN

## 2017-08-19 MED ORDER — EPHEDRINE SULFATE-NACL 50-0.9 MG/10ML-% IV SOSY
PREFILLED_SYRINGE | INTRAVENOUS | Status: DC | PRN
  Administered 2017-08-19 (×4): 5 mg via INTRAVENOUS

## 2017-08-19 MED ORDER — WATER FOR IRRIGATION, STERILE IR SOLN
Status: DC | PRN
  Administered 2017-08-19: 2000 mL via SURGICAL_CAVITY

## 2017-08-19 MED ORDER — ASPIRIN 81 MG PO TABS: 81.0000 mg | ORAL_TABLET | Freq: Two times a day (BID) | ORAL | 1 refills | Status: DC

## 2017-08-19 MED ORDER — MEPERIDINE HCL 50 MG/ML IJ SOLN: 6.2500 mg | INTRAMUSCULAR | Status: DC | PRN

## 2017-08-19 MED ORDER — DEXAMETHASONE SODIUM PHOSPHATE 10 MG/ML IJ SOLN
INTRAMUSCULAR | Status: AC
  Filled 2017-08-19: qty 1

## 2017-08-19 MED ORDER — PROPOFOL 10 MG/ML IV BOLUS
INTRAVENOUS | Status: AC
Start: 2017-08-19 — End: ?
  Filled 2017-08-19: qty 40

## 2017-08-19 MED ORDER — HYDROMORPHONE HCL 1 MG/ML IJ SOLN: 0.5000 mg | INTRAMUSCULAR | Status: DC | PRN

## 2017-08-19 MED ORDER — ALUM & MAG HYDROXIDE-SIMETH 200-200-20 MG/5ML PO SUSP: 30.0000 mL | ORAL | Status: DC | PRN

## 2017-08-19 MED ORDER — 0.9 % SODIUM CHLORIDE (POUR BTL) OPTIME
TOPICAL | Status: DC | PRN
  Administered 2017-08-19: 1000 mL

## 2017-08-19 MED ORDER — CEFAZOLIN SODIUM-DEXTROSE 2-4 GM/100ML-% IV SOLN
2.0000 g | INTRAVENOUS | Status: AC
  Administered 2017-08-19: 2 g via INTRAVENOUS
  Filled 2017-08-19: qty 100

## 2017-08-19 MED ORDER — PROPOFOL 500 MG/50ML IV EMUL
INTRAVENOUS | Status: DC | PRN
  Administered 2017-08-19: 40 ug/kg/min via INTRAVENOUS

## 2017-08-19 MED ORDER — PROMETHAZINE HCL 25 MG/ML IJ SOLN: 6.2500 mg | INTRAMUSCULAR | Status: DC | PRN

## 2017-08-19 MED ORDER — MIDAZOLAM HCL 2 MG/2ML IJ SOLN
INTRAMUSCULAR | Status: AC
  Filled 2017-08-19: qty 2

## 2017-08-19 MED ORDER — MENTHOL 3 MG MT LOZG: 1.0000 | LOZENGE | OROMUCOSAL | Status: DC | PRN

## 2017-08-19 MED ORDER — FENTANYL CITRATE (PF) 100 MCG/2ML IJ SOLN
INTRAMUSCULAR | Status: DC | PRN
  Administered 2017-08-19: 50 ug via INTRAVENOUS

## 2017-08-19 MED ORDER — LEVOTHYROXINE SODIUM 88 MCG PO TABS
88.0000 ug | ORAL_TABLET | Freq: Every day | ORAL | Status: DC
  Administered 2017-08-20 – 2017-08-21 (×2): 88 ug via ORAL
  Filled 2017-08-19 (×2): qty 1

## 2017-08-19 MED ORDER — DEXAMETHASONE SODIUM PHOSPHATE 10 MG/ML IJ SOLN
INTRAMUSCULAR | Status: DC | PRN
  Administered 2017-08-19: 10 mg via INTRAVENOUS

## 2017-08-19 MED ORDER — ONDANSETRON HCL 4 MG PO TABS: 4.0000 mg | ORAL_TABLET | Freq: Four times a day (QID) | ORAL | Status: DC | PRN

## 2017-08-19 MED ORDER — SENNA 8.6 MG PO TABS
2.0000 | ORAL_TABLET | Freq: Every day | ORAL | Status: DC
  Administered 2017-08-19 – 2017-08-20 (×2): 17.2 mg via ORAL
  Filled 2017-08-19 (×2): qty 2

## 2017-08-19 MED ORDER — PROPOFOL 10 MG/ML IV BOLUS
INTRAVENOUS | Status: AC
  Filled 2017-08-19: qty 20

## 2017-08-19 MED ORDER — LIDOCAINE 2% (20 MG/ML) 5 ML SYRINGE
INTRAMUSCULAR | Status: DC | PRN
  Administered 2017-08-19: 100 mg via INTRAVENOUS

## 2017-08-19 MED ORDER — KETOROLAC TROMETHAMINE 15 MG/ML IJ SOLN
7.5000 mg | Freq: Four times a day (QID) | INTRAMUSCULAR | Status: AC
  Administered 2017-08-19 – 2017-08-20 (×4): 7.5 mg via INTRAVENOUS
  Filled 2017-08-19 (×4): qty 1

## 2017-08-19 MED ORDER — DOCUSATE SODIUM 100 MG PO CAPS: 100.0000 mg | ORAL_CAPSULE | Freq: Two times a day (BID) | ORAL | 3 refills | Status: DC

## 2017-08-19 MED ORDER — ISOPROPYL ALCOHOL 70 % SOLN
Status: DC | PRN
  Administered 2017-08-19: 1 via TOPICAL

## 2017-08-19 MED ORDER — DOCUSATE SODIUM 100 MG PO CAPS
100.0000 mg | ORAL_CAPSULE | Freq: Two times a day (BID) | ORAL | Status: DC
  Administered 2017-08-19 – 2017-08-21 (×4): 100 mg via ORAL
  Filled 2017-08-19 (×4): qty 1

## 2017-08-19 MED ORDER — POVIDONE-IODINE 10 % EX SWAB
2.0000 "application " | Freq: Once | CUTANEOUS | Status: AC
  Administered 2017-08-19: 2 via TOPICAL

## 2017-08-19 MED ORDER — SODIUM CHLORIDE 0.9 % IJ SOLN
INTRAMUSCULAR | Status: DC | PRN
  Administered 2017-08-19: 29 mL

## 2017-08-19 MED ORDER — SODIUM CHLORIDE 0.9 % IV SOLN
INTRAVENOUS | Status: DC
  Administered 2017-08-19 – 2017-08-20 (×2): via INTRAVENOUS

## 2017-08-19 MED ORDER — POLYETHYLENE GLYCOL 3350 17 G PO PACK
17.0000 g | PACK | Freq: Every day | ORAL | Status: DC | PRN
Start: 2017-08-19 — End: 2017-08-21

## 2017-08-19 MED ORDER — ONDANSETRON HCL 4 MG/2ML IJ SOLN
INTRAMUSCULAR | Status: AC
Start: 2017-08-19 — End: ?
  Filled 2017-08-19: qty 2

## 2017-08-19 MED ORDER — BUPIVACAINE-EPINEPHRINE 0.25% -1:200000 IJ SOLN
INTRAMUSCULAR | Status: AC
  Filled 2017-08-19: qty 1

## 2017-08-19 MED ORDER — MIDAZOLAM HCL 5 MG/5ML IJ SOLN
INTRAMUSCULAR | Status: DC | PRN
  Administered 2017-08-19 (×2): 1 mg via INTRAVENOUS

## 2017-08-19 MED ORDER — HYDROCODONE-ACETAMINOPHEN 5-325 MG PO TABS: 1.0000 | ORAL_TABLET | ORAL | 0 refills | Status: DC | PRN

## 2017-08-19 MED ORDER — HYDROCODONE-ACETAMINOPHEN 5-325 MG PO TABS
1.0000 | ORAL_TABLET | ORAL | Status: DC | PRN
  Filled 2017-08-19 (×2): qty 1

## 2017-08-19 MED ORDER — DEXAMETHASONE SODIUM PHOSPHATE 10 MG/ML IJ SOLN
10.0000 mg | Freq: Once | INTRAMUSCULAR | Status: AC
  Administered 2017-08-20: 10 mg via INTRAVENOUS
  Filled 2017-08-19: qty 1

## 2017-08-19 MED ORDER — ACETAMINOPHEN 325 MG PO TABS: 650.0000 mg | ORAL_TABLET | ORAL | Status: DC | PRN

## 2017-08-19 MED ORDER — SODIUM CHLORIDE 0.9 % IV SOLN: INTRAVENOUS | Status: DC

## 2017-08-19 MED ORDER — METOCLOPRAMIDE HCL 5 MG/ML IJ SOLN: 5.0000 mg | Freq: Three times a day (TID) | INTRAMUSCULAR | Status: DC | PRN

## 2017-08-19 MED ORDER — MEMANTINE HCL 10 MG PO TABS
10.0000 mg | ORAL_TABLET | Freq: Two times a day (BID) | ORAL | Status: DC
  Administered 2017-08-19 – 2017-08-21 (×4): 10 mg via ORAL
  Filled 2017-08-19 (×4): qty 1

## 2017-08-19 MED ORDER — ACETAMINOPHEN 650 MG RE SUPP: 650.0000 mg | RECTAL | Status: DC | PRN

## 2017-08-19 MED ORDER — SODIUM CHLORIDE 0.9 % IJ SOLN
INTRAMUSCULAR | Status: AC
  Filled 2017-08-19: qty 50

## 2017-08-19 MED ORDER — KETOROLAC TROMETHAMINE 30 MG/ML IJ SOLN
INTRAMUSCULAR | Status: AC
  Filled 2017-08-19: qty 1

## 2017-08-19 MED ORDER — ACETAMINOPHEN 10 MG/ML IV SOLN
1000.0000 mg | INTRAVENOUS | Status: AC
  Administered 2017-08-19: 1000 mg via INTRAVENOUS
  Filled 2017-08-19: qty 100

## 2017-08-19 MED ORDER — PHENOL 1.4 % MT LIQD: 1.0000 | OROMUCOSAL | Status: DC | PRN

## 2017-08-19 MED ORDER — ASPIRIN 81 MG PO CHEW
81.0000 mg | CHEWABLE_TABLET | Freq: Two times a day (BID) | ORAL | Status: DC
  Administered 2017-08-19 – 2017-08-21 (×4): 81 mg via ORAL
  Filled 2017-08-19 (×4): qty 1

## 2017-08-19 MED ORDER — SENNA 8.6 MG PO TABS: 2.0000 | ORAL_TABLET | Freq: Every day | ORAL | 3 refills | Status: DC

## 2017-08-19 MED ORDER — CEFAZOLIN SODIUM-DEXTROSE 2-4 GM/100ML-% IV SOLN
2.0000 g | Freq: Four times a day (QID) | INTRAVENOUS | Status: AC
  Administered 2017-08-19 – 2017-08-20 (×2): 2 g via INTRAVENOUS
  Filled 2017-08-19 (×2): qty 100

## 2017-08-19 MED ORDER — ONDANSETRON HCL 4 MG/2ML IJ SOLN: 4.0000 mg | Freq: Four times a day (QID) | INTRAMUSCULAR | Status: DC | PRN

## 2017-08-19 MED ORDER — HYDROMORPHONE HCL 1 MG/ML IJ SOLN: 0.2500 mg | INTRAMUSCULAR | Status: DC | PRN

## 2017-08-19 MED ORDER — METOCLOPRAMIDE HCL 5 MG PO TABS: 5.0000 mg | ORAL_TABLET | Freq: Three times a day (TID) | ORAL | Status: DC | PRN

## 2017-08-19 MED ORDER — BUPIVACAINE IN DEXTROSE 0.75-8.25 % IT SOLN
INTRATHECAL | Status: DC | PRN
  Administered 2017-08-19: 2 mL via INTRATHECAL

## 2017-08-19 SURGICAL SUPPLY — 56 items
ADH SKN CLS APL DERMABOND .7 (GAUZE/BANDAGES/DRESSINGS) ×1
BAG SPEC THK2 15X12 ZIP CLS (MISCELLANEOUS) ×1
BAG ZIPLOCK 12X15 (MISCELLANEOUS) ×1 IMPLANT
CAPT HIP TOTAL 2 ×1 IMPLANT
CHLORAPREP W/TINT 26ML (MISCELLANEOUS) ×2 IMPLANT
CLOTH BEACON ORANGE TIMEOUT ST (SAFETY) ×2 IMPLANT
COVER PERINEAL POST (MISCELLANEOUS) ×2 IMPLANT
COVER SURGICAL LIGHT HANDLE (MISCELLANEOUS) ×2 IMPLANT
DECANTER SPIKE VIAL GLASS SM (MISCELLANEOUS) ×2 IMPLANT
DERMABOND ADVANCED (GAUZE/BANDAGES/DRESSINGS) ×1
DERMABOND ADVANCED .7 DNX12 (GAUZE/BANDAGES/DRESSINGS) ×2 IMPLANT
DRAPE SHEET LG 3/4 BI-LAMINATE (DRAPES) ×6 IMPLANT
DRAPE STERI IOBAN 125X83 (DRAPES) ×2 IMPLANT
DRAPE TOP SHEET (DRAPES) ×1 IMPLANT
DRAPE U-SHAPE 47X51 STRL (DRAPES) ×4 IMPLANT
DRESSING AQUACEL AG SP 3.5X10 (GAUZE/BANDAGES/DRESSINGS) IMPLANT
DRSG AQUACEL AG ADV 3.5X10 (GAUZE/BANDAGES/DRESSINGS) ×2 IMPLANT
DRSG AQUACEL AG SP 3.5X10 (GAUZE/BANDAGES/DRESSINGS) ×2
ELECT PENCIL ROCKER SW 15FT (MISCELLANEOUS) ×2 IMPLANT
ELECT REM PT RETURN 15FT ADLT (MISCELLANEOUS) ×2 IMPLANT
GAUZE SPONGE 4X4 12PLY STRL (GAUZE/BANDAGES/DRESSINGS) ×2 IMPLANT
GLOVE BIO SURGEON STRL SZ7 (GLOVE) ×1 IMPLANT
GLOVE BIO SURGEON STRL SZ8.5 (GLOVE) ×4 IMPLANT
GLOVE BIOGEL M STRL SZ7.5 (GLOVE) ×1 IMPLANT
GLOVE BIOGEL PI IND STRL 7.0 (GLOVE) IMPLANT
GLOVE BIOGEL PI IND STRL 7.5 (GLOVE) IMPLANT
GLOVE BIOGEL PI IND STRL 8.5 (GLOVE) ×1 IMPLANT
GLOVE BIOGEL PI INDICATOR 7.0 (GLOVE) ×5
GLOVE BIOGEL PI INDICATOR 7.5 (GLOVE) ×3
GLOVE BIOGEL PI INDICATOR 8.5 (GLOVE) ×1
GLOVE ECLIPSE 7.0 STRL STRAW (GLOVE) ×1 IMPLANT
GOWN SPEC L3 XXLG W/TWL (GOWN DISPOSABLE) ×2 IMPLANT
GOWN STRL REUS W/ TWL XL LVL3 (GOWN DISPOSABLE) IMPLANT
GOWN STRL REUS W/TWL LRG LVL3 (GOWN DISPOSABLE) ×1 IMPLANT
GOWN STRL REUS W/TWL XL LVL3 (GOWN DISPOSABLE) ×4 IMPLANT
HANDPIECE INTERPULSE COAX TIP (DISPOSABLE) ×2
HOLDER FOLEY CATH W/STRAP (MISCELLANEOUS) ×2 IMPLANT
HOOD PEEL AWAY FLYTE STAYCOOL (MISCELLANEOUS) ×8 IMPLANT
MARKER SKIN DUAL TIP RULER LAB (MISCELLANEOUS) ×2 IMPLANT
NDL SPNL 18GX3.5 QUINCKE PK (NEEDLE) ×1 IMPLANT
NEEDLE SPNL 18GX3.5 QUINCKE PK (NEEDLE) ×2 IMPLANT
PACK ANTERIOR HIP CUSTOM (KITS) ×2 IMPLANT
SAW OSC TIP CART 19.5X105X1.3 (SAW) ×2 IMPLANT
SEALER BIPOLAR AQUA 6.0 (INSTRUMENTS) ×2 IMPLANT
SET HNDPC FAN SPRY TIP SCT (DISPOSABLE) ×1 IMPLANT
SUT ETHIBOND NAB CT1 #1 30IN (SUTURE) ×4 IMPLANT
SUT MNCRL AB 3-0 PS2 18 (SUTURE) ×2 IMPLANT
SUT MON AB 2-0 CT1 36 (SUTURE) ×4 IMPLANT
SUT STRATAFIX PDO 1 14 VIOLET (SUTURE) ×2
SUT STRATFX PDO 1 14 VIOLET (SUTURE) ×1
SUT VIC AB 2-0 CT1 27 (SUTURE) ×2
SUT VIC AB 2-0 CT1 TAPERPNT 27 (SUTURE) ×1 IMPLANT
SUTURE STRATFX PDO 1 14 VIOLET (SUTURE) ×1 IMPLANT
SYR 50ML LL SCALE MARK (SYRINGE) ×2 IMPLANT
TRAY FOLEY CATH SILVER 14FR (SET/KITS/TRAYS/PACK) ×1 IMPLANT
YANKAUER SUCT BULB TIP 10FT TU (MISCELLANEOUS) ×2 IMPLANT

## 2017-08-19 NOTE — Op Note (Signed)
OPERATIVE REPORT  SURGEON: Rod Can, MD   ASSISTANT: Nehemiah Massed, PA-C.  PREOPERATIVE DIAGNOSIS: Right hip arthritis.   POSTOPERATIVE DIAGNOSIS: Right hip arthritis.   PROCEDURE: Right total hip arthroplasty, anterior approach.   IMPLANTS: DePuy Tri Lock stem, size 5, std offset. DePuy Pinnacle Cup, size 52 mm. DePuy Altrx liner, size 32 by 52 mm, neutral. DePuy Biolox ceramic head ball, size 32 + 5 mm.  ANESTHESIA:  Spinal  ESTIMATED BLOOD LOSS:-250 mL    ANTIBIOTICS: 2 g Ancef.  DRAINS: None.  COMPLICATIONS: None.   CONDITION: PACU - hemodynamically stable.   BRIEF CLINICAL NOTE: Monica Irwin is a 72 y.o. female with a long-standing history of Right hip arthritis. After failing conservative management, the patient was indicated for total hip arthroplasty. The risks, benefits, and alternatives to the procedure were explained, and the patient elected to proceed.  PROCEDURE IN DETAIL: Surgical site was marked by myself in the pre-op holding area. Once inside the operating room, spinal anesthesia was obtained, and a foley catheter was inserted. The patient was then positioned on the Hana table. All bony prominences were well padded. The hip was prepped and draped in the normal sterile surgical fashion. A time-out was called verifying side and site of surgery. The patient received IV antibiotics within 60 minutes of beginning the procedure.  The direct anterior approach to the hip was performed through the Hueter interval. Lateral femoral circumflex vessels were treated with the Auqumantys. The anterior capsule was exposed and an inverted T capsulotomy was made.The femoral neck cut was made to the level of the templated cut. A corkscrew was placed into the head and the head was removed. The femoral head was found to have eburnated bone. The head was passed to the back table and was measured.  Acetabular exposure was achieved, and the pulvinar and labrum  were excised. Sequential reaming of the acetabulum was then performed up to a size 51 mm reamer. A 52 mm cup was then opened and impacted into place at approximately 40 degrees of abduction and 20 degrees of anteversion. The final polyethylene liner was impacted into place and acetabular osteophytes were removed.   I then gained femoral exposure taking care to protect the abductors and greater trochanter. This was performed using standard external rotation, extension, and adduction. The capsule was peeled off the inner aspect of the greater trochanter, taking care to preserve the short external rotators. A cookie cutter was used to enter the femoral canal, and then the femoral canal finder was placed. Sequential broaching was performed up to a size 5. Calcar planer was used on the femoral neck remnant. I placed a std offset neck and a trial head ball. The hip was reduced. Leg lengths and offset were checked fluoroscopically. The hip was dislocated and trial components were removed. The final implants were placed, and the hip was reduced.  Fluoroscopy was used to confirm component position and leg lengths. At 90 degrees of external rotation and full extension, the hip was stable to an anterior directed force.  Please note that lengthening of the operative lower extremity was required in order to achieve appropriate soft tissue tension.  As the contralateral hip is also arthritic, leg length discrepancy can be addressed in the future with left total hip arthroplasty.  The wound was copiously irrigated with normal saline using pulse lavage. Marcaine solution was injected into the periarticular soft tissue. The wound was closed in layers using #1 Vicryl and V-Loc for the fascia, 2-0  Vicryl for the subcutaneous fat, 2-0 Monocryl for the deep dermal layer, 3-0 running Monocryl subcuticular stitch, and Dermabond for the skin. Once the glue was fully dried, an Aquacell Ag dressing was applied. The  patient was transported to the recovery room in stable condition. Sponge, needle, and instrument counts were correct at the end of the case x2. The patient tolerated the procedure well and there were no known complications.  Please note that a surgical assistant was a medical necessity for this procedure to perform it in a safe and expeditious manner. Assistant was necessary to provide appropriate retraction of vital neurovascular structures, to prevent femoral fracture, and to allow for anatomic placement of the prosthesis.

## 2017-08-19 NOTE — Discharge Instructions (Signed)
°Dr. Mahek Schlesinger °Joint Replacement Specialist °[facility] °[address] °, [place] [address] °[phone] ° ° °TOTAL HIP REPLACEMENT POSTOPERATIVE DIRECTIONS ° ° ° °Hip Rehabilitation, Guidelines Following Surgery  ° °WEIGHT BEARING °Weight bearing as tolerated with assist device (walker, cane, etc) as directed, use it as long as suggested by your surgeon or therapist, typically at least 4-6 weeks. ° °The results of a hip operation are greatly improved after range of motion and muscle strengthening exercises. Follow all safety measures which are given to protect your hip. If any of these exercises cause increased pain or swelling in your joint, decrease the amount until you are comfortable again. Then slowly increase the exercises. Call your caregiver if you have problems or questions.  ° °HOME CARE INSTRUCTIONS  °Most of the following instructions are designed to prevent the dislocation of your new hip.  °Remove items at home which could result in a fall. This includes throw rugs or furniture in walking pathways.  °Continue medications as instructed at time of discharge. °· You may have some home medications which will be placed on hold until you complete the course of blood thinner medication. °· You may start showering once you are discharged home. Do not remove your dressing. °Do not put on socks or shoes without following the instructions of your caregivers.   °Sit on chairs with arms. Use the chair arms to help push yourself up when arising.  °Arrange for the use of a toilet seat elevator so you are not sitting low.  °· Walk with walker as instructed.  °You may resume a sexual relationship in one month or when given the OK by your caregiver.  °Use walker as long as suggested by your caregivers.  °You may put full weight on your legs and walk as much as is comfortable. °Avoid periods of inactivity such as sitting longer than an hour when not asleep. This helps prevent  blood clots.  °You may return to work once you are cleared by your surgeon.  °Do not drive a car for 6 weeks or until released by your surgeon.  °Do not drive while taking narcotics.  °Wear elastic stockings for two weeks following surgery during the day but you may remove then at night.  °Make sure you keep all of your appointments after your operation with all of your doctors and caregivers. You should call the office at the above phone number and make an appointment for approximately two weeks after the date of your surgery. °Please pick up a stool softener and laxative for home use as long as you are requiring pain medications. °· ICE to the affected hip every three hours for 30 minutes at a time and then as needed for pain and swelling. Continue to use ice on the hip for pain and swelling from surgery. You may notice swelling that will progress down to the foot and ankle.  This is normal after surgery.  Elevate the leg when you are not up walking on it.   °It is important for you to complete the blood thinner medication as prescribed by your doctor. °· Continue to use the breathing machine which will help keep your temperature down.  It is common for your temperature to cycle up and down following surgery, especially at night when you are not up moving around and exerting yourself.  The breathing machine keeps your lungs expanded and your temperature down. ° °RANGE OF MOTION AND STRENGTHENING EXERCISES  °These exercises are   designed to help you keep full movement of your hip joint. Follow your caregiver's or physical therapist's instructions. Perform all exercises about fifteen times, three times per day or as directed. Exercise both hips, even if you have had only one joint replacement. These exercises can be done on a training (exercise) mat, on the floor, on a table or on a bed. Use whatever works the best and is most comfortable for you. Use music or television while you are exercising so that the exercises  are a pleasant break in your day. This will make your life better with the exercises acting as a break in routine you can look forward to.  °Lying on your back, slowly slide your foot toward your buttocks, raising your knee up off the floor. Then slowly slide your foot back down until your leg is straight again.  °Lying on your back spread your legs as far apart as you can without causing discomfort.  °Lying on your side, raise your upper leg and foot straight up from the floor as far as is comfortable. Slowly lower the leg and repeat.  °Lying on your back, tighten up the muscle in the front of your thigh (quadriceps muscles). You can do this by keeping your leg straight and trying to raise your heel off the floor. This helps strengthen the largest muscle supporting your knee.  °Lying on your back, tighten up the muscles of your buttocks both with the legs straight and with the knee bent at a comfortable angle while keeping your heel on the floor.  ° °SKILLED REHAB INSTRUCTIONS: °If the patient is transferred to a skilled rehab facility following release from the hospital, a list of the current medications will be sent to the facility for the patient to continue.  When discharged from the skilled rehab facility, please have the facility set up the patient's Home Health Physical Therapy prior to being released. Also, the skilled facility will be responsible for providing the patient with their medications at time of release from the facility to include their pain medication and their blood thinner medication. If the patient is still at the rehab facility at time of the two week follow up appointment, the skilled rehab facility will also need to assist the patient in arranging follow up appointment in our office and any transportation needs. ° °MAKE SURE YOU:  °Understand these instructions.  °Will watch your condition.  °Will get help right away if you are not doing well or get worse. ° °Pick up stool softner and  laxative for home use following surgery while on pain medications. °Do not remove your dressing. °The dressing is waterproof--it is OK to take showers. °Continue to use ice for pain and swelling after surgery. °Do not use any lotions or creams on the incision until instructed by your surgeon. °Total Hip Protocol. ° ° °

## 2017-08-19 NOTE — Discharge Summary (Signed)
Physician Discharge Summary  Patient ID: Monica Irwin MRN: [id number] DOB/AGE: Feb 13, 1946 72 y.o.  Admit date: 08/19/2017 Discharge date: 08/21/2017  Admission Diagnoses:  Primary osteoarthritis of right hip  Discharge Diagnoses:  Principal Problem:   Primary osteoarthritis of right hip Active Problems:   Osteoarthritis of right hip   Past Medical History:  Diagnosis Date  . 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  [name] Dysrhythmia    hx of SVT - 2017   . History of colonic polyps   . History of kidney stones   . Hypothyroidism   . Liver cyst   . Memory loss   . Nephrolithiasis   . Osteoarthritis   . Osteoporosis   . SVT (supraventricular tachycardia) ([place]) 05/07/2015    Surgeries: Procedure(s): RIGHT TOTAL HIP ARTHROPLASTY ANTERIOR APPROACH on 08/19/2017   Consultants (if any):   Discharged Condition: Improved  Hospital Course: Monica Irwin is an 72 y.o. female who was admitted 08/19/2017 with a diagnosis of Primary osteoarthritis of right hip and went to the operating room on 08/19/2017 and underwent the above named procedures.    She was given perioperative antibiotics:  Anti-infectives (From admission, onward)   Start     Dose/Rate Route Frequency Ordered Stop   08/19/17 1800  ceFAZolin (ANCEF) IVPB 2g/100 mL premix     2 g 200 mL/hr over 30 Minutes Intravenous Every 6 hours 08/19/17 1548 08/20/17 0102   08/19/17 1006  ceFAZolin (ANCEF) IVPB 2g/100 mL premix     2 g 200 mL/hr over 30 Minutes Intravenous On call to O.R. 08/19/17 1006 08/19/17 1235    .  She was given sequential compression devices, early ambulation, and ASA for DVT prophylaxis.  She benefited maximally from the hospital stay and there were no complications.    Recent vital signs:  Vitals:   08/20/17 2159 08/21/17 0608  BP: 136/65 (!) 147/92  Pulse: 70 78  Resp: 17 16  Temp: 98.6 F (37 C) 97.8 F (36.6 C)  SpO2: 100% 100%    Recent  laboratory studies:  Lab Results  Component Value Date   HGB 12.2 08/21/2017   HGB 13.3 08/20/2017   HGB 15.4 (H) 08/03/2017   Lab Results  Component Value Date   WBC 9.7 08/21/2017   PLT 187 08/21/2017   Lab Results  Component Value Date   INR 1.01 11/11/2015   Lab Results  Component Value Date   NA 140 08/20/2017   K 4.1 08/20/2017   CL 110 08/20/2017   CO2 23 08/20/2017   BUN 20 08/20/2017   CREATININE 0.63 08/20/2017   GLUCOSE 110 (H) 08/20/2017    Discharge Medications:   Allergies as of 08/21/2017   No Known Allergies     Medication List    STOP taking these medications   naproxen sodium 220 MG tablet Commonly known as:  ALEVE     TAKE these medications   aspirin 81 MG tablet Take 1 tablet (81 mg total) by mouth 2 (two) times daily after a meal.   docusate sodium 100 MG capsule Commonly known as:  COLACE Take 1 capsule (100 mg total) by mouth 2 (two) times daily.   donepezil 10 MG tablet Commonly known as:  ARICEPT Take 1 tablet (10 mg total) by mouth at bedtime.   fluocinonide ointment 0.05 % Commonly known as:  LIDEX Apply 1 application topically 2 (two) times daily. What changed:    when to take this  reasons to take this   HYDROcodone-acetaminophen 5-325 MG tablet Commonly known as:  NORCO Take 1-2 tablets by mouth every 4 (four) hours as needed for moderate pain.   ibuprofen 200 MG tablet Commonly known as:  ADVIL,MOTRIN Take 400 mg by mouth daily as needed for moderate pain.   levothyroxine 88 MCG tablet Commonly known as:  SYNTHROID, LEVOTHROID TAKE 1 TABLET BY MOUTH EVERY MORNING 30 MINUTES BEFORE BREAKFAST.   memantine 10 MG tablet Commonly known as:  NAMENDA Take 1 tablet (10 mg total) by mouth 2 (two) times daily.   ondansetron 4 MG tablet Commonly known as:  ZOFRAN Take 1 tablet (4 mg total) by mouth every 8 (eight) hours as needed for nausea or vomiting.   senna 8.6 MG Tabs tablet Commonly known as:  SENOKOT Take 2  tablets (17.2 mg total) by mouth at bedtime.       Diagnostic Studies: Dg Pelvis Portable  Result Date: 08/19/2017 CLINICAL DATA:  Status post right total hip replacement today. EXAM: PORTABLE PELVIS 1-2 VIEWS COMPARISON:  Fluoroscopic film August 19, 2017 FINDINGS: Right total hip replacement is identified without malalignment. Degenerative joint changes of left hip are noted. IMPRESSION: Right total hip replacement is identified without malalignment. Electronically Signed   By: Abelardo Diesel M.D.   On: 08/19/2017 15:13   Dg C-arm 1-60 Min-no Report  Result Date: 08/19/2017 Fluoroscopy was utilized by the requesting physician.  No radiographic interpretation.   Dg Hip Operative Unilat W Or W/o Pelvis Right  Result Date: 08/19/2017 CLINICAL DATA:  Right hip replacement EXAM: OPERATIVE RIGHT HIP WITH PELVIS COMPARISON:  None. FLUOROSCOPY TIME:  Radiation Exposure Index (as provided by the fluoroscopic device): 1.44 mGy If the device does not provide the exposure index: Fluoroscopy Time:  26 seconds Number of Acquired Images:  2 FINDINGS: Right hip replacement is noted in satisfactory position. No acute bony abnormality is seen. IMPRESSION: Status post right hip replacement. Electronically Signed   By: Inez Catalina M.D.   On: 08/19/2017 13:48    Disposition: 03-Skilled Nursing Facility  Discharge Instructions    Call MD / Call 911   Complete by:  As directed    If you experience chest pain or shortness of breath, CALL 911 and be transported to the hospital emergency room.  If you develope a fever above 101 F, pus (white drainage) or increased drainage or redness at the wound, or calf pain, call your surgeon's office.   Constipation Prevention   Complete by:  As directed    Drink plenty of fluids.  Prune juice may be helpful.  You may use a stool softener, such as Colace (over the counter) 100 mg twice a day.  Use MiraLax (over the counter) for constipation as needed.   Diet - low sodium heart  healthy   Complete by:  As directed    Increase activity slowly as tolerated   Complete by:  As directed        Contact information for follow-up providers    Adriana Lina, Aaron Edelman, MD. Schedule an appointment as soon as possible for a visit in 2 weeks.   Specialty:  Orthopedic Surgery Why:  For wound re-check Contact information: [address] [place] [address] [phone]            Contact information for after-discharge care    Destination    [facility] .   Service:  Skilled Nursing Contact information: [address]  [place]                  Signed: Hilton Cork Delcenia Inman 08/22/2017, 8:24 PM

## 2017-08-19 NOTE — Anesthesia Postprocedure Evaluation (Signed)
Anesthesia Post Note  Patient: Monica Irwin  Procedure(s) Performed: RIGHT TOTAL HIP ARTHROPLASTY ANTERIOR APPROACH (Right Hip)     Patient location during evaluation: PACU Anesthesia Type: Spinal Level of consciousness: awake and alert Pain management: pain level controlled Vital Signs Assessment: post-procedure vital signs reviewed and stable Respiratory status: spontaneous breathing and respiratory function stable Cardiovascular status: blood pressure returned to baseline and stable Postop Assessment: spinal receding Anesthetic complications: no    Last Vitals:  Vitals:   08/19/17 1635 08/19/17 1728  BP: 139/70 135/71  Pulse: 72 78  Resp: 16 16  Temp: 36.4 C (!) 36.4 C  SpO2: 100% 99%    Last Pain:  Vitals:   08/19/17 1728  TempSrc: Oral  PainSc:                  Nolon Nations

## 2017-08-19 NOTE — Transfer of Care (Signed)
Immediate Anesthesia Transfer of Care Note  Patient: Monica Irwin  Procedure(s) Performed: RIGHT TOTAL HIP ARTHROPLASTY ANTERIOR APPROACH (Right Hip)  Patient Location: PACU  Anesthesia Type:Spinal  Level of Consciousness: awake, alert , oriented and patient cooperative  Airway & Oxygen Therapy: Patient Spontanous Breathing and Patient connected to face mask oxygen  Post-op Assessment: Report given to RN and Post -op Vital signs reviewed and stable  Post vital signs: Reviewed and stable  Last Vitals:  Vitals:   08/19/17 1009  BP: 135/84  Pulse: 65  Resp: 16  Temp: 36.8 C  SpO2: 96%    Last Pain:  Vitals:   08/19/17 1036  TempSrc:   PainSc: 3       Patients Stated Pain Goal: 4 (35/70/17 7939)  Complications: No apparent anesthesia complications

## 2017-08-19 NOTE — Anesthesia Preprocedure Evaluation (Signed)
Anesthesia Evaluation  Patient identified by MRN, date of birth, ID band Patient awake    Reviewed: Allergy & Precautions, NPO status , Patient's Chart, lab work & pertinent test results  Airway Mallampati: II  TM Distance: >3 FB Neck ROM: Full    Dental no notable dental hx.    Pulmonary neg pulmonary ROS,    Pulmonary exam normal breath sounds clear to auscultation       Cardiovascular + Peripheral Vascular Disease  Normal cardiovascular exam+ dysrhythmias  Rhythm:Regular Rate:Normal     Neuro/Psych PSYCHIATRIC DISORDERS Depression negative neurological ROS     GI/Hepatic negative GI ROS, Neg liver ROS,   Endo/Other  Hypothyroidism   Renal/GU Renal disease     Musculoskeletal  (+) Arthritis ,   Abdominal   Peds  Hematology negative hematology ROS (+)   Anesthesia Other Findings   Reproductive/Obstetrics negative OB ROS                             Anesthesia Physical Anesthesia Plan  ASA: II  Anesthesia Plan: Spinal   Post-op Pain Management:    Induction: Intravenous  PONV Risk Score and Plan: 3 and Ondansetron, Dexamethasone, Propofol infusion and Treatment may vary due to age or medical condition  Airway Management Planned:   Additional Equipment:   Intra-op Plan:   Post-operative Plan:   Informed Consent: I have reviewed the patients History and Physical, chart, labs and discussed the procedure including the risks, benefits and alternatives for the proposed anesthesia with the patient or authorized representative who has indicated his/her understanding and acceptance.   Dental advisory given  Plan Discussed with: CRNA  Anesthesia Plan Comments:         Anesthesia Quick Evaluation

## 2017-08-19 NOTE — Interval H&P Note (Signed)
History and Physical Interval Note:  08/19/2017 11:48 AM  Monica Irwin  has presented today for surgery, with the diagnosis of Right hip degenerative joint disease  The various methods of treatment have been discussed with the patient and family. After consideration of risks, benefits and other options for treatment, the patient has consented to  Procedure(s) with comments: RIGHT TOTAL HIP ARTHROPLASTY ANTERIOR APPROACH (Right) - Needs RNFA as a surgical intervention .  The patient's history has been reviewed, patient examined, no change in status, stable for surgery.  I have reviewed the patient's chart and labs.  Questions were answered to the patient's satisfaction.     Hilton Cork Lelania Bia

## 2017-08-19 NOTE — Anesthesia Procedure Notes (Signed)
Spinal  Start time: 08/19/2017 11:58 AM End time: 08/19/2017 12:04 PM Staffing Resident/CRNA: Victoriano Lain, CRNA Preanesthetic Checklist Completed: patient identified, site marked, surgical consent, pre-op evaluation, timeout performed, IV checked, risks and benefits discussed and monitors and equipment checked Spinal Block Patient position: sitting Prep: ChloraPrep and site prepped and draped Patient monitoring: heart rate, continuous pulse ox and blood pressure Approach: midline Location: L3-4 Injection technique: single-shot Needle Needle type: Pencan  Needle gauge: 24 G Needle length: 10 cm Assessment Sensory level: T4 Additional Notes Spinal kit expiration date checked and verified. Pt placed in sitting position. + CSF, - heme. Pt tolerated well.

## 2017-08-20 LAB — CBC
HCT: 40.5 % (ref 36.0–46.0)
Hemoglobin: 13.3 g/dL (ref 12.0–15.0)
MCH: 30.6 pg (ref 26.0–34.0)
MCHC: 32.8 g/dL (ref 30.0–36.0)
MCV: 93.1 fL (ref 78.0–100.0)
PLATELETS: 200 10*3/uL (ref 150–400)
RBC: 4.35 MIL/uL (ref 3.87–5.11)
RDW: 13.1 % (ref 11.5–15.5)
WBC: 11.3 10*3/uL — AB (ref 4.0–10.5)

## 2017-08-20 LAB — BASIC METABOLIC PANEL
Anion gap: 7 (ref 5–15)
BUN: 20 mg/dL (ref 6–20)
CO2: 23 mmol/L (ref 22–32)
Calcium: 8.6 mg/dL — ABNORMAL LOW (ref 8.9–10.3)
Chloride: 110 mmol/L (ref 101–111)
Creatinine, Ser: 0.63 mg/dL (ref 0.44–1.00)
Glucose, Bld: 110 mg/dL — ABNORMAL HIGH (ref 65–99)
POTASSIUM: 4.1 mmol/L (ref 3.5–5.1)
SODIUM: 140 mmol/L (ref 135–145)

## 2017-08-20 NOTE — [place] FL2 (Signed)
[place] MEDICAID FL2 LEVEL OF CARE SCREENING TOOL     IDENTIFICATION  Patient Name: Monica Irwin Birthdate: 04-22-1946 Sex: female Admission Date (Current Location): 08/19/2017  [facility] and [place] Number:  Herbalist and Address:  [facility],  [place] [address] [place]      Provider Number: [id number]  Attending Physician Name and Address:  Rod Can, MD  Relative Name and Phone Number:       Current Level of Care: Hospital Recommended Level of Care: [place] Prior Approval Number:    Date Approved/Denied:   PASRR Number: [id number]  Discharge Plan: SNF    Current Diagnoses: Patient Active Problem List   Diagnosis Date Noted  . Primary osteoarthritis of right hip 08/19/2017  . Osteoarthritis of right hip 08/19/2017  . Mild cognitive impairment 10/19/2016  . Injury of right rotator cuff 08/20/2016  . History of colonic polyps 10/15/2015  . AVNRT (AV nodal re-entry tachycardia) ([place]) 08/19/2015  . Aortic calcification ([place]) 04/22/2015  . Major depressive disorder, recurrent episode, in full remission ([place]) 07/24/2010  . OSTEOPOROSIS 07/24/2010  . INSOMNIA 08/29/2009  . HYPERLIPIDEMIA 12/19/2008  . HYPOTHYROIDISM 07/02/2008  . OSTEOARTHRITIS 07/02/2008  . NEPHROLITHIASIS, HX OF 07/02/2008    Orientation RESPIRATION BLADDER Height & Weight     Self, Time, Situation, Place  Normal Continent Weight: 162 lb (73.5 kg) Height:  5' 4.5" (163.8 cm)  BEHAVIORAL SYMPTOMS/MOOD NEUROLOGICAL BOWEL NUTRITION STATUS      Continent Diet(Heart Healthy )  AMBULATORY STATUS COMMUNICATION OF NEEDS Skin   Extensive Assist Verbally (Incision Hip)                       Personal Care Assistance Level of Assistance  Bathing, Feeding, Dressing Bathing Assistance: Limited assistance Feeding assistance: Independent Dressing Assistance: Limited assistance     Functional Limitations Info  Sight, Hearing, Speech  Sight Info: Adequate Hearing Info: Adequate Speech Info: Adequate    SPECIAL CARE FACTORS FREQUENCY  PT (By licensed PT), OT (By licensed OT)     PT Frequency: 5X/week OT Frequency: 5X/week            Contractures Contractures Info: Present    Additional Factors Info  Code Status, Allergies Code Status Info: Fullcode Allergies Info: Allergies: No Known Allergies           Current Medications (08/20/2017):  This is the current hospital active medication list Current Facility-Administered Medications  Medication Dose Route Frequency Provider Last Rate Last Dose  . 0.9 %  sodium chloride infusion   Intravenous Continuous Rod Can, MD 150 mL/hr at 08/20/17 0032    . acetaminophen (TYLENOL) tablet 650 mg  650 mg Oral Q4H PRN Hamp Moreland, Aaron Edelman, MD       Or  . acetaminophen (TYLENOL) suppository 650 mg  650 mg Rectal Q4H PRN Virl Coble, Aaron Edelman, MD      . alum & mag hydroxide-simeth (MAALOX/MYLANTA) 200-200-20 MG/5ML suspension 30 mL  30 mL Oral Q4H PRN Nakeshia Waldeck, Aaron Edelman, MD      . aspirin chewable tablet 81 mg  81 mg Oral BID Rod Can, MD   81 mg at 08/20/17 0837  . diphenhydrAMINE (BENADRYL) 12.5 MG/5ML elixir 12.5-25 mg  12.5-25 mg Oral Q4H PRN Saverio Kader, Aaron Edelman, MD      . docusate sodium (COLACE) capsule 100 mg  100 mg Oral BID Rod Can, MD   100 mg at 08/20/17 0837  . donepezil (ARICEPT) tablet 10 mg  10 mg Oral QHS Rod Can, MD   10 mg at 08/19/17 2101  . HYDROcodone-acetaminophen (NORCO/VICODIN) 5-325 MG per tablet 1 tablet  1 tablet Oral Q4H PRN Barney Russomanno, Aaron Edelman, MD      . HYDROcodone-acetaminophen (NORCO/VICODIN) 5-325 MG per tablet 2 tablet  2 tablet Oral Q4H PRN Rod Can, MD   2 tablet at 08/20/17 [phone]  . HYDROmorphone (DILAUDID) injection 0.5-2 mg  0.5-2 mg Intravenous Q2H PRN Seger Jani, Aaron Edelman, MD      . ketorolac (TORADOL) 15 MG/ML injection 7.5 mg  7.5 mg Intravenous Q6H Kineta Fudala, Aaron Edelman, MD   7.5 mg at 08/20/17 0526  . levothyroxine (SYNTHROID,  LEVOTHROID) tablet 88 mcg  88 mcg Oral QAC breakfast Rod Can, MD   88 mcg at 08/20/17 [phone]  . memantine (NAMENDA) tablet 10 mg  10 mg Oral BID Rod Can, MD   10 mg at 08/20/17 0837  . menthol-cetylpyridinium (CEPACOL) lozenge 3 mg  1 lozenge Oral PRN Adiyah Lame, Aaron Edelman, MD       Or  . phenol (CHLORASEPTIC) mouth spray 1 spray  1 spray Mouth/Throat PRN Lavanna Rog, Aaron Edelman, MD      . methocarbamol (ROBAXIN) tablet 500 mg  500 mg Oral Q6H PRN Maximo Spratling, Aaron Edelman, MD       Or  . methocarbamol (ROBAXIN) 500 mg in dextrose 5 % 50 mL IVPB  500 mg Intravenous Q6H PRN Tifanny Dollens, Aaron Edelman, MD      . metoCLOPramide (REGLAN) tablet 5-10 mg  5-10 mg Oral Q8H PRN Pragya Lofaso, Aaron Edelman, MD       Or  . metoCLOPramide (REGLAN) injection 5-10 mg  5-10 mg Intravenous Q8H PRN Dyrell Tuccillo, Aaron Edelman, MD      . ondansetron (ZOFRAN) tablet 4 mg  4 mg Oral Q6H PRN Kayler Buckholtz, Aaron Edelman, MD       Or  . ondansetron (ZOFRAN) injection 4 mg  4 mg Intravenous Q6H PRN Angeleah Labrake, Aaron Edelman, MD      . polyethylene glycol (MIRALAX / GLYCOLAX) packet 17 g  17 g Oral Daily PRN Kale Dols, Aaron Edelman, MD      . senna (SENOKOT) tablet 17.2 mg  2 tablet Oral QHS Rod Can, MD   17.2 mg at 08/19/17 2101     Discharge Medications: Please see discharge summary for a list of discharge medications.  Relevant Imaging Results:  Relevant Lab Results:   Additional Information [phone]  Lia Hopping, LCSW

## 2017-08-20 NOTE — Progress Notes (Addendum)
   Subjective:  Patient reports pain as mild.  No complaints this morning of any pain.  She is very comfortable.  She denies shortness of breath, chest pain, nausea or vomiting.  She has been up with therapy once.  Objective:   VITALS:   Vitals:   08/19/17 1728 08/19/17 2033 08/20/17 0120 08/20/17 0521  BP: 135/71 125/63 109/71 120/69  Pulse: 78 73 62 63  Resp: 16 16 16 16   Temp: (!) 97.5 F (36.4 C) 97.8 F (36.6 C) 97.8 F (36.6 C) (!) 97.5 F (36.4 C)  TempSrc: Oral Oral Oral Oral  SpO2: 99% 99% 99% 100%  Weight:      Height:       Right lower extremity  Neurovascular intact Sensation intact distally Intact pulses distally Dorsiflexion/Plantar flexion intact Incision: dressing C/D/I   Lab Results  Component Value Date   WBC 11.3 (H) 08/20/2017   HGB 13.3 08/20/2017   HCT 40.5 08/20/2017   MCV 93.1 08/20/2017   PLT 200 08/20/2017   BMET    Component Value Date/Time   NA 140 08/20/2017 0626   NA 144 10/19/2016 1308   K 4.1 08/20/2017 0626   CL 110 08/20/2017 0626   CO2 23 08/20/2017 0626   GLUCOSE 110 (H) 08/20/2017 0626   BUN 20 08/20/2017 0626   BUN 22 10/19/2016 1308   CREATININE 0.63 08/20/2017 0626   CREATININE 0.79 07/16/2015 1228   CALCIUM 8.6 (L) 08/20/2017 0626   GFRNONAA >60 08/20/2017 0626   GFRAA >60 08/20/2017 0626     Assessment/Plan: 1 Day Post-Op   Principal Problem:   Primary osteoarthritis of right hip Active Problems:   Osteoarthritis of right hip   Advance diet Up with therapy Weight-bear as tolerated to right lower extremity. Aspirin and SCDs for DVT prophylaxis. Plan for discharge home Saturday to collapse nursing facility.   Nicholes Stairs 08/20/2017, 7:35 AM   Geralynn Rile, MD [phone]

## 2017-08-20 NOTE — Clinical Social Work Placement (Signed)
   CLINICAL SOCIAL WORK PLACEMENT  NOTE  Date:  08/20/2017  Patient Details  Name: KASYN STOUFFER MRN: [id number] Date of Birth: [birth year]  Clinical Social Work is seeking post-discharge placement for this patient at the [place] level of care (*CSW will initial, date and re-position this form in  chart as items are completed):  Yes   Patient/family provided with [facility] Work Department's list of facilities offering this level of care within the geographic area requested by the patient (or if unable, by the patient's family).  Yes   Patient/family informed of their freedom to choose among providers that offer the needed level of care, that participate in Medicare, Medicaid or managed care program needed by the patient, have an available bed and are willing to accept the patient.  Yes   Patient/family informed of [facility]'s ownership interest in [facility] and [facility], as well as of the fact that they are under no obligation to receive care at these facilities.  PASRR submitted to EDS on       PASRR number received on       Existing PASRR number confirmed on 08/20/17     FL2 transmitted to all facilities in geographic area requested by pt/family on       FL2 transmitted to all facilities within larger geographic area on 08/20/17     Patient informed that his/her managed care company has contracts with or will negotiate with certain facilities, including the following:        Yes   Patient/family informed of bed offers received.  Patient chooses bed at [place], [place]     Physician recommends and patient chooses bed at      Patient to be transferred to [place], [place] on  .  Patient to be transferred to facility by [facility]     Patient family notified on   of transfer.  Name of family member notified:  Daughter Debbie     PHYSICIAN Please prepare priority discharge summary, including medications, Please sign  FL2     Additional Comment:    _______________________________________________ Lia Hopping, LCSW 08/20/2017, 1:23 PM

## 2017-08-20 NOTE — Progress Notes (Signed)
Physical Therapy Treatment Patient Details Name: Monica Irwin MRN: [id number] DOB: 1946/02/10 Today's Date: 08/20/2017    History of Present Illness This 72 y.o. female admitted for Rt THA (direct anterior approach).  PMH includes:   SVT, memory loss    PT Comments    Pt very motivated and progressing well with mobility.  Pt hopeful for dc to rehab setting tomorrow.   Follow Up Recommendations  SNF     Equipment Recommendations  None recommended by PT    Recommendations for Other Services OT consult     Precautions / Restrictions Precautions Precautions: Fall Restrictions Weight Bearing Restrictions: No Other Position/Activity Restrictions: WBAT    Mobility  Bed Mobility Overal bed mobility: Needs Assistance Bed Mobility: Sit to Supine     Supine to sit: Min assist Sit to supine: Min assist   General bed mobility comments: cues for sequence and use of L LE to self assist  Transfers Overall transfer level: Needs assistance Equipment used: Rolling walker (2 wheeled) Transfers: Sit to/from Stand Sit to Stand: Min guard         General transfer comment: cues for LE management and use of UEs to self assist  Ambulation/Gait Ambulation/Gait assistance: Min assist;Min guard Ambulation Distance (Feet): 130 Feet(twice) Assistive device: Rolling walker (2 wheeled) Gait Pattern/deviations: Step-to pattern;Step-through pattern;Decreased step length - right;Decreased step length - left;Shuffle;Trunk flexed Gait velocity: decr Gait velocity interpretation: Below normal speed for age/gender General Gait Details: cues for posture, position from RW and initial sequence   Stairs            Wheelchair Mobility    Modified Rankin (Stroke Patients Only)       Balance Overall balance assessment: Needs assistance Sitting-balance support: Feet supported Sitting balance-Leahy Scale: Good     Standing balance support: During functional activity;Single  extremity supported Standing balance-Leahy Scale: Fair                              Cognition Arousal/Alertness: Awake/alert Behavior During Therapy: WFL for tasks assessed/performed Overall Cognitive Status: No family/caregiver present to determine baseline cognitive functioning                                 General Comments: Pt with h/o memory loss.  Appears she may be at baseline, but no family present       Exercises Total Joint Exercises Ankle Circles/Pumps: AROM;Both;20 reps;Supine Quad Sets: AROM;Both;10 reps;Supine Heel Slides: AAROM;Right;20 reps;Supine Hip ABduction/ADduction: AAROM;Right;15 reps;Supine    General Comments        Pertinent Vitals/Pain Pain Assessment: Faces Faces Pain Scale: Hurts a little bit Pain Location: Rt hip  Pain Descriptors / Indicators: Operative site guarding Pain Intervention(s): Limited activity within patient's tolerance;Monitored during session;Ice applied    Home Living Family/patient expects to be discharged to:: Skilled nursing facility Living Arrangements: Alone             Additional Comments: Pt lived alone PTA    Prior Function Level of Independence: Independent      Comments: Pt reports she was fully independent PTA.  She reports one recent fall in which her hip "gave way"    PT Goals (current goals can now be found in the care plan section) Acute Rehab PT Goals Patient Stated Goal: to go to rehab, then home  PT Goal Formulation: With patient Time For Goal  Achievement: 09/03/17 Potential to Achieve Goals: Good Progress towards PT goals: Progressing toward goals    Frequency    7X/week      PT Plan Current plan remains appropriate    Co-evaluation              AM-PAC PT "6 Clicks" Daily Activity  Outcome Measure  Difficulty turning over in bed (including adjusting bedclothes, sheets and blankets)?: Unable Difficulty moving from lying on back to sitting on the side  of the bed? : Unable Difficulty sitting down on and standing up from a chair with arms (e.g., wheelchair, bedside commode, etc,.)?: Unable Help needed moving to and from a bed to chair (including a wheelchair)?: A Little Help needed walking in hospital room?: A Little Help needed climbing 3-5 steps with a railing? : A Little 6 Click Score: 12    End of Session Equipment Utilized During Treatment: Gait belt Activity Tolerance: Patient tolerated treatment well Patient left: in bed;with call bell/phone within reach;with bed alarm set Nurse Communication: Mobility status PT Visit Diagnosis: Difficulty in walking, not elsewhere classified (R26.2)     Time: 1353-1410 PT Time Calculation (min) (ACUTE ONLY): 17 min  Charges:  $Gait Training: 8-22 mins $Therapeutic Exercise: 8-22 mins                    G Codes:       Pg [phone]    Tifanie Gardiner 08/20/2017, 2:23 PM

## 2017-08-20 NOTE — Evaluation (Signed)
Occupational Therapy Evaluation Patient Details Name: Monica Irwin MRN: [id number] DOB: 1946/01/17 Today's Date: 08/20/2017    History of Present Illness This 72 y.o. female admitted for Rt THA (direct anterior approach).  PMH includes:   SVT, memory loss   Clinical Impression   Pt admitted with the above.  She demonstrates the below listed deficits.  She currently requires min - mod A for ADLs and min A for functional transfers.  She lives alone, has h/o memory deficits, and was fully independent PTA.  Feel pt would benefit from Short term SNF stay to allow her to maximize safety and independence with ADLs to reduce risks of falls, injury, and readmission.  All further OT needs can be addressed at SNF.  Acute OT will sign off at this time.     Follow Up Recommendations  SNF;Supervision/Assistance - 24 hour    Equipment Recommendations  3 in 1 bedside commode    Recommendations for Other Services       Precautions / Restrictions Precautions Precautions: Fall      Mobility Bed Mobility               General bed mobility comments: Pt up in chair   Transfers Overall transfer level: Needs assistance   Transfers: Sit to/from Stand;Stand Pivot Transfers Sit to Stand: Min guard Stand pivot transfers: Min guard       General transfer comment: min guard assist for safety     Balance Overall balance assessment: Needs assistance Sitting-balance support: Feet supported Sitting balance-Leahy Scale: Good     Standing balance support: During functional activity;Single extremity supported Standing balance-Leahy Scale: Fair                             ADL either performed or assessed with clinical judgement   ADL Overall ADL's : Needs assistance/impaired Eating/Feeding: Independent   Grooming: Wash/dry hands;Wash/dry face;Oral care;Brushing hair;Min guard;Standing   Upper Body Bathing: Set up;Sitting   Lower Body Bathing: Moderate assistance;Sit  to/from stand Lower Body Bathing Details (indicate cue type and reason): pt requires assist from knees down and assist for peri area  Upper Body Dressing : Set up;Sitting   Lower Body Dressing: Moderate assistance;Sit to/from stand Lower Body Dressing Details (indicate cue type and reason): unable to access feet Toilet Transfer: Minimal assistance;Ambulation;Comfort height toilet;Grab bars;RW Toilet Transfer Details (indicate cue type and reason): assist to manage RW through small/tight spaces.  Pt with poor walker safety   Toileting- Clothing Manipulation and Hygiene: Minimal assistance;Sit to/from stand       Functional mobility during ADLs: Minimal assistance;Min guard;Rolling walker General ADL Comments: Pt requires cues for safety      Vision         Perception     Praxis      Pertinent Vitals/Pain Pain Assessment: Faces Faces Pain Scale: Hurts a little bit Pain Location: Rt hip  Pain Descriptors / Indicators: Operative site guarding Pain Intervention(s): Monitored during session;Repositioned     Hand Dominance Right   Extremity/Trunk Assessment Upper Extremity Assessment Upper Extremity Assessment: Overall WFL for tasks assessed   Lower Extremity Assessment Lower Extremity Assessment: Defer to PT evaluation   Cervical / Trunk Assessment Cervical / Trunk Assessment: Normal   Communication Communication Communication: No difficulties   Cognition Arousal/Alertness: Awake/alert Behavior During Therapy: WFL for tasks assessed/performed Overall Cognitive Status: No family/caregiver present to determine baseline cognitive functioning  General Comments: Pt with h/o memory loss.  Appears she may be at baseline, but no family present    General Comments       Exercises     Shoulder Instructions      Home Living Family/patient expects to be discharged to:: Skilled nursing facility Living Arrangements: Alone                                Additional Comments: Pt lived alone PTA      Prior Functioning/Environment Level of Independence: Independent        Comments: Pt reports she was fully independent PTA.  She reports one recent fall in which her hip "gave way"         OT Problem List: Decreased activity tolerance;Decreased safety awareness;Decreased cognition;Decreased knowledge of use of DME or AE;Pain      OT Treatment/Interventions:      OT Goals(Current goals can be found in the care plan section) Acute Rehab OT Goals Patient Stated Goal: to go to rehab, then home  OT Goal Formulation: With patient Time For Goal Achievement: 08/27/17 Potential to Achieve Goals: Good  OT Frequency:     Barriers to D/C:            Co-evaluation              AM-PAC PT "6 Clicks" Daily Activity     Outcome Measure Help from another person eating meals?: None Help from another person taking care of personal grooming?: A Little Help from another person toileting, which includes using toliet, bedpan, or urinal?: A Little Help from another person bathing (including washing, rinsing, drying)?: A Lot Help from another person to put on and taking off regular upper body clothing?: A Little Help from another person to put on and taking off regular lower body clothing?: A Lot 6 Click Score: 17   End of Session Equipment Utilized During Treatment: Rolling walker Nurse Communication: Mobility status  Activity Tolerance: Patient tolerated treatment well Patient left: in chair;with call bell/phone within reach;with chair alarm set  OT Visit Diagnosis: Unsteadiness on feet (R26.81);Pain;Cognitive communication deficit (R41.841) Pain - Right/Left: Right Pain - part of body: Hip                Time: 1006-1017 OT Time Calculation (min): 11 min Charges:  OT General Charges $OT Visit: 1 Visit OT Evaluation $OT Eval Moderate Complexity: 1 Mod G-Codes:     [facility],  OTR/L [phone]   Lucille Passy M 08/20/2017, 10:26 AM

## 2017-08-20 NOTE — Evaluation (Signed)
Physical Therapy Evaluation Patient Details Name: Monica Irwin MRN: [id number] DOB: 21-Jan-1946 Today's Date: 08/20/2017   History of Present Illness  This 72 y.o. female admitted for Rt THA (direct anterior approach).  PMH includes:   SVT, memory loss  Clinical Impression  Pt s/p R THR and presents with decreased R LE strength/ROM and post op pain limiting functional mobility.  Pt would benefit from follow up rehab at SNF level to maximize IND and safety prior to return home ALONE.    Follow Up Recommendations SNF    Equipment Recommendations  None recommended by PT    Recommendations for Other Services OT consult     Precautions / Restrictions Precautions Precautions: Fall Restrictions Weight Bearing Restrictions: No Other Position/Activity Restrictions: WBAT      Mobility  Bed Mobility Overal bed mobility: Needs Assistance Bed Mobility: Supine to Sit     Supine to sit: Min assist     General bed mobility comments: cues for sequence and use of L LE to self assist  Transfers Overall transfer level: Needs assistance Equipment used: Rolling walker (2 wheeled) Transfers: Sit to/from Stand Sit to Stand: Min assist Stand pivot transfers: Min guard       General transfer comment: cues for LE management and use of UEs to self assist  Ambulation/Gait Ambulation/Gait assistance: Min assist Ambulation Distance (Feet): 60 Feet(twice) Assistive device: Rolling walker (2 wheeled) Gait Pattern/deviations: Step-to pattern;Step-through pattern;Decreased step length - right;Decreased step length - left;Shuffle;Trunk flexed Gait velocity: decr Gait velocity interpretation: Below normal speed for age/gender General Gait Details: cues for posture, position from RW and initial sequence  Stairs            Wheelchair Mobility    Modified Rankin (Stroke Patients Only)       Balance Overall balance assessment: Needs assistance Sitting-balance support: Feet  supported Sitting balance-Leahy Scale: Good     Standing balance support: During functional activity;Single extremity supported Standing balance-Leahy Scale: Fair                               Pertinent Vitals/Pain Pain Assessment: Faces Faces Pain Scale: Hurts a little bit Pain Location: Rt hip  Pain Descriptors / Indicators: Operative site guarding Pain Intervention(s): Limited activity within patient's tolerance;Monitored during session;Premedicated before session    Home Living Family/patient expects to be discharged to:: Skilled nursing facility Living Arrangements: Alone               Additional Comments: Pt lived alone PTA    Prior Function Level of Independence: Independent         Comments: Pt reports she was fully independent PTA.  She reports one recent fall in which her hip "gave way"      Hand Dominance   Dominant Hand: Right    Extremity/Trunk Assessment   Upper Extremity Assessment Upper Extremity Assessment: Overall WFL for tasks assessed    Lower Extremity Assessment Lower Extremity Assessment: RLE deficits/detail RLE Deficits / Details: Strength at hip 2+/5 with AAROM at hip to 90 flex and 15 abd    Cervical / Trunk Assessment Cervical / Trunk Assessment: Normal  Communication   Communication: No difficulties  Cognition Arousal/Alertness: Awake/alert Behavior During Therapy: WFL for tasks assessed/performed Overall Cognitive Status: No family/caregiver present to determine baseline cognitive functioning  General Comments: Pt with h/o memory loss.  Appears she may be at baseline, but no family present       General Comments      Exercises Total Joint Exercises Ankle Circles/Pumps: AROM;Both;20 reps;Supine Quad Sets: AROM;Both;10 reps;Supine Heel Slides: AAROM;Right;20 reps;Supine Hip ABduction/ADduction: AAROM;Right;15 reps;Supine   Assessment/Plan    PT Assessment  Patient needs continued PT services  PT Problem List Decreased strength;Decreased range of motion;Decreased activity tolerance;Decreased mobility;Decreased balance;Decreased knowledge of use of DME;Pain       PT Treatment Interventions DME instruction;Gait training;Functional mobility training;Therapeutic activities;Therapeutic exercise;Patient/family education    PT Goals (Current goals can be found in the Care Plan section)  Acute Rehab PT Goals Patient Stated Goal: to go to rehab, then home  PT Goal Formulation: With patient Time For Goal Achievement: 09/03/17 Potential to Achieve Goals: Good    Frequency 7X/week   Barriers to discharge        Co-evaluation               AM-PAC PT "6 Clicks" Daily Activity  Outcome Measure Difficulty turning over in bed (including adjusting bedclothes, sheets and blankets)?: Unable Difficulty moving from lying on back to sitting on the side of the bed? : Unable Difficulty sitting down on and standing up from a chair with arms (e.g., wheelchair, bedside commode, etc,.)?: Unable Help needed moving to and from a bed to chair (including a wheelchair)?: A Little Help needed walking in hospital room?: A Little Help needed climbing 3-5 steps with a railing? : A Little 6 Click Score: 12    End of Session Equipment Utilized During Treatment: Gait belt Activity Tolerance: Patient tolerated treatment well Patient left: in chair;with call bell/phone within reach;with chair alarm set Nurse Communication: Mobility status PT Visit Diagnosis: Difficulty in walking, not elsewhere classified (R26.2)    Time: 1884-1660 PT Time Calculation (min) (ACUTE ONLY): 23 min   Charges:   PT Evaluation $PT Eval Low Complexity: 1 Low PT Treatments $Therapeutic Exercise: 8-22 mins   PT G Codes:        Pg [phone]   Fatiha Guzy 08/20/2017, 10:46 AM

## 2017-08-20 NOTE — Clinical Social Work Note (Signed)
Clinical Social Work Assessment  Patient Details  Name: Monica Irwin MRN: [id number] Date of Birth: 11/24/1945  Date of referral:  08/20/17               Reason for consult:  Facility Placement                Permission sought to share information with:    Permission granted to share information::  Yes, Verbal Permission Granted  Name::        Agency::  SN  Relationship::  Daughter Water quality scientist Information:     Housing/Transportation Living arrangements for the past 2 months:  Single Family Home Source of Information:  Patient Patient Interpreter Needed:    Criminal Activity/Legal Involvement Pertinent to Current Situation/Hospitalization:  No - Comment as needed Significant Relationships:  Adult Children Lives with:  Self Do you feel safe going back to the place where you live?  Yes Need for family participation in patient care:  Yes (Dependent with mobility)  Care giving concerns:   Patient has history of and pain and functional disability in the right hip due to arthritis. Patient admitted for surgical intervention. PT completed evaluation and recommends short rehab stay.   Social Worker assessment / plan:  CSW met with the patient at bedside, explain role and reason for visit to assist with discharge planning to SNF. Patient reports she prefers to go to [facility] if space is available.  Patient reports prior to admission she was very independent able to complete all ADL's and still transports herself. Patient is hopeful to return to her previous level of functioning after rehab.   CSW completed FL2. Sent clinicals to be reviewed.  PASRR completed.   Plan: SNF -[facility] extended bed offers.  [facility] authorization submitted.   Employment status:  Retired Nurse, adult PT Recommendations:  [place] / Referral to community resources:  [place]  Patient/Family's Response to care:  Agreeable and  Responding well to care.   Patient/Family's Understanding of and Emotional Response to Diagnosis, Current Treatment, and Prognosis:  Patient knowledgeable about her diagnosis and planned surgical intervention.   Emotional Assessment Appearance:  Appears stated age Attitude/Demeanor/Rapport:    Affect (typically observed):  Accepting Orientation:  Oriented to Self, Oriented to Place, Oriented to  Time, Oriented to Situation Alcohol / Substance use:  Not Applicable Psych involvement (Current and /or in the community):     Discharge Needs  Concerns to be addressed:  Discharge Planning Concerns Readmission within the last 30 days:  No Current discharge risk:  None Barriers to Discharge:  Ship broker, Continued Medical Work up   [employer], [place] 08/20/2017, 10:34 AM

## 2017-08-21 LAB — CBC
HCT: 38.2 % (ref 36.0–46.0)
HEMOGLOBIN: 12.2 g/dL (ref 12.0–15.0)
MCH: 30 pg (ref 26.0–34.0)
MCHC: 31.9 g/dL (ref 30.0–36.0)
MCV: 93.9 fL (ref 78.0–100.0)
PLATELETS: 187 10*3/uL (ref 150–400)
RBC: 4.07 MIL/uL (ref 3.87–5.11)
RDW: 13.4 % (ref 11.5–15.5)
WBC: 9.7 10*3/uL (ref 4.0–10.5)

## 2017-08-21 NOTE — Progress Notes (Signed)
   Subjective: 2 Days Post-Op Procedure(s) (LRB): RIGHT TOTAL HIP ARTHROPLASTY ANTERIOR APPROACH (Right)  Pt doing well Standing taking a bath this morning with minimal difficulty  Denies any new symptoms or issues Patient reports pain as mild.  Objective:   VITALS:   Vitals:   08/20/17 2159 08/21/17 0608  BP: 136/65 (!) 147/92  Pulse: 70 78  Resp: 17 16  Temp: 98.6 F (37 C) 97.8 F (36.6 C)  SpO2: 100% 100%   Right hip incision healing well nv intact distally Good rom without pain including weight bearing  LABS Recent Labs    08/20/17 0626 08/21/17 0540  HGB 13.3 12.2  HCT 40.5 38.2  WBC 11.3* 9.7  PLT 200 187    Recent Labs    08/20/17 0626  NA 140  K 4.1  BUN 20  CREATININE 0.63  GLUCOSE 110*     Assessment/Plan: 2 Days Post-Op Procedure(s) (LRB): RIGHT TOTAL HIP ARTHROPLASTY ANTERIOR APPROACH (Right) D/c to SNF today F/u in 2 weeks Activity as tolerated    Merla Riches PA-C, MPAS [place] is now [facility] [address], [place], [place] [address] Phone: [phone] [email or web address] Facebook  [employer]

## 2017-08-21 NOTE — Progress Notes (Signed)
Report called and given to Nurse at [facility] SNF, patient vss, incision is within normal limits, pain controlled adequately Neta Mends RN 12:49 PM 08-21-2017

## 2017-08-21 NOTE — Discharge Summary (Signed)
Physician Discharge Summary  Patient ID: Monica Irwin MRN: [id number] DOB/AGE: 1946/06/19 72 y.o.  Admit date: 08/19/2017 Discharge date:  08/21/17  Procedures:  Procedure(s) (LRB): RIGHT TOTAL HIP ARTHROPLASTY ANTERIOR APPROACH (Right)  Attending Physician:  Dr. Rod Can  Admission Diagnoses:   Right hip end stage osteoarthritis  Discharge Diagnoses:  Principal Problem:   Primary osteoarthritis of right hip Active Problems:   Osteoarthritis of right hip  Past Medical History:  Diagnosis Date  . 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  [name] Dysrhythmia    hx of SVT - 2017   . History of colonic polyps   . History of kidney stones   . Hypothyroidism   . Liver cyst   . Memory loss   . Nephrolithiasis   . Osteoarthritis   . Osteoporosis   . SVT (supraventricular tachycardia) ([place]) 05/07/2015     PCP: Owens Loffler, MD   Discharged Condition: good  Hospital Course:  Patient underwent the above stated procedure on 08/19/2017. Patient tolerated the procedure well and brought to the recovery room in good condition and subsequently to the floor.   Disposition: 01-Home or Self Care with follow up in 2 weeks   Follow-up Information    [place], Aaron Edelman, MD. Schedule an appointment as soon as possible for a visit in 2 weeks.   Specialty:  Orthopedic Surgery Why:  For wound re-check Contact information: [address] [place] [address] [phone]           Discharge Instructions    Call MD / Call 911   Complete by:  As directed    If you experience chest pain or shortness of breath, CALL 911 and be transported to the hospital emergency room.  If you develope a fever above 101 F, pus (white drainage) or increased drainage or redness at the wound, or calf pain, call your surgeon's office.   Constipation Prevention   Complete by:  As directed    Drink plenty of fluids.  Prune juice may be  helpful.  You may use a stool softener, such as Colace (over the counter) 100 mg twice a day.  Use MiraLax (over the counter) for constipation as needed.   Diet - low sodium heart healthy   Complete by:  As directed    Increase activity slowly as tolerated   Complete by:  As directed       Allergies as of 08/21/2017   No Known Allergies     Medication List    STOP taking these medications   naproxen sodium 220 MG tablet Commonly known as:  ALEVE     TAKE these medications   aspirin 81 MG tablet Take 1 tablet (81 mg total) by mouth 2 (two) times daily after a meal.   docusate sodium 100 MG capsule Commonly known as:  COLACE Take 1 capsule (100 mg total) by mouth 2 (two) times daily.   donepezil 10 MG tablet Commonly known as:  ARICEPT Take 1 tablet (10 mg total) by mouth at bedtime.   fluocinonide ointment 0.05 % Commonly known as:  LIDEX Apply 1 application topically 2 (two) times daily. What changed:    when to take this  reasons to take this   HYDROcodone-acetaminophen 5-325 MG tablet Commonly known as:  NORCO Take 1-2 tablets by mouth every 4 (four) hours as needed for moderate pain.   ibuprofen 200 MG tablet Commonly known as:  ADVIL,MOTRIN Take 400 mg  by mouth daily as needed for moderate pain.   levothyroxine 88 MCG tablet Commonly known as:  SYNTHROID, LEVOTHROID TAKE 1 TABLET BY MOUTH EVERY MORNING 30 MINUTES BEFORE BREAKFAST.   memantine 10 MG tablet Commonly known as:  NAMENDA Take 1 tablet (10 mg total) by mouth 2 (two) times daily.   ondansetron 4 MG tablet Commonly known as:  ZOFRAN Take 1 tablet (4 mg total) by mouth every 8 (eight) hours as needed for nausea or vomiting.   senna 8.6 MG Tabs tablet Commonly known as:  SENOKOT Take 2 tablets (17.2 mg total) by mouth at bedtime.        Kathrynn Speed, MPAS Physician Assistant [place] is now [facility] [address], [address], [place], [place]  [address] Phone: [phone] [email or web address] Facebook  [employer]

## 2017-08-21 NOTE — Clinical Social Work Placement (Signed)
Patient received and accepted bed offer at [facility]. Facility aware of patient's discharge and confirmed bed offer. PTAR contacted, patient's family notified. Patient's RN can call report to [phone] Room 105, packet complete. CSW signing off, no other needs identified at this time.  CLINICAL SOCIAL WORK PLACEMENT  NOTE  Date:  08/21/2017  Patient Details  Name: Monica Irwin MRN: [id number] Date of Birth: [birth year]  Clinical Social Work is seeking post-discharge placement for this patient at the [place] level of care (*CSW will initial, date and re-position this form in  chart as items are completed):  Yes   Patient/family provided with [place] Work Department's list of facilities offering this level of care within the geographic area requested by the patient (or if unable, by the patient's family).  Yes   Patient/family informed of their freedom to choose among providers that offer the needed level of care, that participate in Medicare, Medicaid or managed care program needed by the patient, have an available bed and are willing to accept the patient.  Yes   Patient/family informed of [facility]'s ownership interest in [facility] and [facility], as well as of the fact that they are under no obligation to receive care at these facilities.  PASRR submitted to EDS on       PASRR number received on       Existing PASRR number confirmed on 08/20/17     FL2 transmitted to all facilities in geographic area requested by pt/family on       FL2 transmitted to all facilities within larger geographic area on 08/20/17     Patient informed that his/her managed care company has contracts with or will negotiate with certain facilities, including the following:        Yes   Patient/family informed of bed offers received.  Patient chooses bed at [place] [place]     Physician recommends and patient chooses bed at      Patient to  be transferred to [place] [place] on 08/21/17.  Patient to be transferred to facility by PTAR     Patient family notified on 08/21/17 of transfer.  Name of family member notified:  Son in law Cecil Please sign FL2     Additional Comment:    _______________________________________________ Burnis Medin, LCSW 08/21/2017, 11:43 AM

## 2017-08-21 NOTE — Progress Notes (Signed)
Physical Therapy Treatment Patient Details Name: Monica Irwin MRN: [id number] DOB: 1946/01/20 Today's Date: 08/21/2017    History of Present Illness This 72 y.o. female admitted for Rt THA (direct anterior approach).  PMH includes:   SVT, memory loss    PT Comments    POD # 2 am session Assisted with amb in hallway then performed some TE's.   Follow Up Recommendations  SNF     Equipment Recommendations  None recommended by PT    Recommendations for Other Services       Precautions / Restrictions Precautions Precautions: Fall    Mobility  Bed Mobility               General bed mobility comments: OOB in recliner  Transfers Overall transfer level: Needs assistance Equipment used: Rolling walker (2 wheeled) Transfers: Sit to/from Stand Sit to Stand: Min guard Stand pivot transfers: Min guard       General transfer comment: cues for LE management and use of UEs to self assist  Ambulation/Gait Ambulation/Gait assistance: Min assist;Min guard Ambulation Distance (Feet): 85 Feet Assistive device: Rolling walker (2 wheeled) Gait Pattern/deviations: Step-to pattern;Step-through pattern;Decreased step length - right;Decreased step length - left;Shuffle;Trunk flexed Gait velocity: decr   General Gait Details: cues for posture, position from RW and initial sequence   Stairs            Wheelchair Mobility    Modified Rankin (Stroke Patients Only)       Balance                                            Cognition Arousal/Alertness: Awake/alert Behavior During Therapy: WFL for tasks assessed/performed Overall Cognitive Status: Within Functional Limits for tasks assessed                                        Exercises      General Comments        Pertinent Vitals/Pain Pain Assessment: 0-10 Faces Pain Scale: Hurts even more Pain Location: Rt hip  Pain Descriptors / Indicators: Operative site  guarding Pain Intervention(s): Monitored during session;Repositioned    Home Living                      Prior Function            PT Goals (current goals can now be found in the care plan section) Progress towards PT goals: Progressing toward goals    Frequency    7X/week      PT Plan Current plan remains appropriate    Co-evaluation              AM-PAC PT "6 Clicks" Daily Activity  Outcome Measure  Difficulty turning over in bed (including adjusting bedclothes, sheets and blankets)?: Unable Difficulty moving from lying on back to sitting on the side of the bed? : Unable Difficulty sitting down on and standing up from a chair with arms (e.g., wheelchair, bedside commode, etc,.)?: Unable Help needed moving to and from a bed to chair (including a wheelchair)?: A Little Help needed walking in hospital room?: A Little Help needed climbing 3-5 steps with a railing? : A Little 6 Click Score: 12    End of Session Equipment Utilized During Treatment:  Gait belt Activity Tolerance: Patient tolerated treatment well Patient left: in bed;with call bell/phone within reach;with bed alarm set Nurse Communication: Mobility status PT Visit Diagnosis: Difficulty in walking, not elsewhere classified (R26.2)     Time: 6578-4696 PT Time Calculation (min) (ACUTE ONLY): 18 min  Charges:  $Gait Training: 8-22 mins                    G Codes:       Rica Koyanagi  PTA [facility]  Acute  Rehab Pager      [phone]

## 2017-09-13 ENCOUNTER — Ambulatory Visit (INDEPENDENT_AMBULATORY_CARE_PROVIDER_SITE_OTHER): Payer: Medicare Other | Admitting: Family Medicine

## 2017-09-13 ENCOUNTER — Other Ambulatory Visit: Payer: Self-pay

## 2017-09-13 ENCOUNTER — Encounter: Payer: Self-pay | Admitting: Family Medicine

## 2017-09-13 VITALS — BP 100/60 | HR 84 | Temp 98.5°F | Ht 63.5 in | Wt 167.8 lb

## 2017-09-13 DIAGNOSIS — M1611 Unilateral primary osteoarthritis, right hip: Secondary | ICD-10-CM | POA: Diagnosis not present

## 2017-09-13 DIAGNOSIS — E038 Other specified hypothyroidism: Secondary | ICD-10-CM | POA: Diagnosis not present

## 2017-09-13 LAB — TSH: TSH: 5.06 u[IU]/mL — AB (ref 0.35–4.50)

## 2017-09-13 MED ORDER — LEVOTHYROXINE SODIUM 88 MCG PO TABS: ORAL_TABLET | ORAL | 0 refills | Status: DC

## 2017-09-13 NOTE — Progress Notes (Signed)
Dr. Frederico Hamman T. Vicci Reder, MD, [facility] [place], [address] Phone: [phone] Fax: [phone]  09/13/2017  Patient: Monica Irwin, MRN: [id number], DOB: [birth year], 72 y.o.  Primary Physician:  Owens Loffler, MD   Chief Complaint  Patient presents with  . Follow-up    Rehab after hip replacement   Subjective:   Monica Irwin is a 72 y.o. very pleasant female patient who presents with the following:  F/u R [facility], Dr. Lyla Glassing.   Has been at [facility]  No PT per Dr. Lyla Glassing.  Feeling. Does not have that much much pain, not continuing all day.  Eating and drinking normally and doing ok  Thyroid: No symptoms. Labs reviewed. Denies cold / heat intolerance, dry skin, hair loss. No goiter.  Lab Results  Component Value Date   TSH 1.180 10/19/2016     Past Medical History, Surgical History, Social History, Family History, Problem List, Medications, and Allergies have been reviewed and updated if relevant.  Patient Active Problem List   Diagnosis Date Noted  . Primary osteoarthritis of right hip 08/19/2017  . Osteoarthritis of right hip 08/19/2017  . Mild cognitive impairment 10/19/2016  . Injury of right rotator cuff 08/20/2016  . History of colonic polyps 10/15/2015  . AVNRT (AV nodal re-entry tachycardia) ([place]) 08/19/2015  . Aortic calcification ([place]) 04/22/2015  . Major depressive disorder, recurrent episode, in full remission ([place]) 07/24/2010  . OSTEOPOROSIS 07/24/2010  . INSOMNIA 08/29/2009  . HYPERLIPIDEMIA 12/19/2008  . HYPOTHYROIDISM 07/02/2008  . OSTEOARTHRITIS 07/02/2008  . NEPHROLITHIASIS, HX OF 07/02/2008    Past Medical History:  Diagnosis Date  . AVNRT (AV nodal re-entry tachycardia) ([place]) 08/19/2015   S/p successful ablation 07/2015.  [name] Dysrhythmia    hx of SVT - 2017   . History of colonic polyps   . History of kidney stones   . Hypothyroidism   . Liver cyst   . Memory  loss   . Nephrolithiasis   . Osteoarthritis   . Osteoporosis   . SVT (supraventricular tachycardia) ([place]) 05/07/2015    Past Surgical History:  Procedure Laterality Date  . ABLATION    . BACK SURGERY     [phone]  . ELECTROPHYSIOLOGIC STUDY N/A 07/22/2015   Procedure: SVT Ablation;  Surgeon: Will Meredith Leeds, MD;  Location: [facility];  Service: Cardiovascular;  Laterality: N/A;  . MM BREAST STEREO BIOPSY LEFT ([place] [facility])    . PARTIAL COLECTOMY  2005  . SMALL INTESTINE SURGERY    . THYROIDECTOMY  2008  . TOTAL HIP ARTHROPLASTY Right 08/19/2017   Procedure: RIGHT TOTAL HIP ARTHROPLASTY ANTERIOR APPROACH;  Surgeon: Rod Can, MD;  Location: [facility];  Service: Orthopedics;  Laterality: Right;    Social History   Socioeconomic History  . Marital status: Divorced    Spouse name: Not on file  . Number of children: 1  . Years of education: HS  . Highest education level: Not on file  Social Needs  . Financial resource strain: Not on file  . Food insecurity - worry: Not on file  . Food insecurity - inability: Not on file  . Transportation needs - medical: Not on file  . Transportation needs - non-medical: Not on file  Occupational History  . Occupation: retired [employer]: RETIRED  Tobacco Use  . Smoking status: Never Smoker  . Smokeless tobacco: Never Used  Substance and Sexual Activity  .  Alcohol use: No    Alcohol/week: 0.0 oz    Frequency: Never  . Drug use: No  . Sexual activity: No  Other Topics Concern  . Not on file  Social History Narrative   Regular exercise--no   Lives at home alone.   Right-handed.   1 cup caffeine daily.            Epworth Sleepiness Scale      Total: 4                Family History  Problem Relation Age of Onset  . Stroke Mother   . Alzheimer's disease Mother   . Heart failure Brother   . Alzheimer's disease Sister   . Other Father        natural causes  . Heart attack  Brother   . Colon cancer Unknown        nephew  . Ulcerative colitis Sister        colon removed    No Known Allergies  Medication list reviewed and updated in full in [facility].   GEN: No acute illnesses, no fevers, chills. GI: No n/v/d, eating normally Pulm: No SOB Interactive and getting along well at home.  Otherwise, ROS is as per the HPI.  Objective:   BP 100/60   Pulse 84   Temp 98.5 F (36.9 C) (Oral)   Ht 5' 3.5" (1.613 m)   Wt 167 lb 12 oz (76.1 kg)   BMI 29.25 kg/m   GEN: WDWN, NAD, Non-toxic, A & O x 3 HEENT: Atraumatic, Normocephalic. Neck supple. No masses, No LAD. Ears and Nose: No external deformity. CV: RRR, No M/G/R. No JVD. No thrill. No extra heart sounds. PULM: CTA B, no wheezes, crackles, rhonchi. No retractions. No resp. distress. No accessory muscle use. EXTR: No c/c/e NEURO Normal gait.  PSYCH: Normally interactive. Conversant. Not depressed or anxious appearing.  Calm demeanor.   Laboratory and Imaging Data:  Assessment and Plan:   Primary osteoarthritis of right hip  Other specified hypothyroidism - Plan: TSH   Doing well in [facility] recovery  Check thyroid labs  Follow-up: No Follow-up on file.  Meds ordered this encounter  Medications  . levothyroxine (SYNTHROID, LEVOTHROID) 88 MCG tablet    Sig: TAKE 1 TABLET BY MOUTH EVERY MORNING 30 MINUTES BEFORE BREAKFAST.    Dispense:  90 tablet    Refill:  0   Orders Placed This Encounter  Procedures  . TSH    Signed,  Frederico Hamman T. Rowen Hur, MD   Allergies as of 09/13/2017   No Known Allergies     Medication List        Accurate as of 09/13/17  1:42 PM. Always use your most recent med list.          aspirin 81 MG tablet Take 1 tablet (81 mg total) by mouth 2 (two) times daily after a meal.   donepezil 10 MG tablet Commonly known as:  ARICEPT Take 1 tablet (10 mg total) by mouth at bedtime.   fluocinonide ointment 0.05 % Commonly known as:  LIDEX Apply 1  application topically 2 (two) times daily.   ibuprofen 200 MG tablet Commonly known as:  ADVIL,MOTRIN Take 400 mg by mouth daily as needed for moderate pain.   levothyroxine 88 MCG tablet Commonly known as:  SYNTHROID, LEVOTHROID TAKE 1 TABLET BY MOUTH EVERY MORNING 30 MINUTES BEFORE BREAKFAST.   memantine 10 MG tablet Commonly known as:  NAMENDA Take  1 tablet (10 mg total) by mouth 2 (two) times daily.

## 2017-09-14 ENCOUNTER — Telehealth: Payer: Self-pay | Admitting: Neurology

## 2017-09-14 DIAGNOSIS — R4181 Age-related cognitive decline: Secondary | ICD-10-CM

## 2017-09-14 NOTE — Telephone Encounter (Signed)
Patient's daughter [name] is calling to discuss patient getting MRI or CT scan.

## 2017-09-14 NOTE — Telephone Encounter (Signed)
I called pt's daughter, Monica Irwin, per DPR. She reports that while working with physical therapy, therapist noticed a lack of attention and occasional disorientation in the pt. Monica Irwin is wondering if an MRI or CT be can be ordered before the pt follows up again with Dr. Krista Blue.  Spoke with Dr. Krista Blue, she gave VO for MRI brain w/o contrast.  I called Monica Irwin, explained this, advised her that we will call her to schedule the MRI. A follow up appt was made for 12/09/17 at 9:00am. Pt's daughter verbalized understanding of recommendations and appt date and time.

## 2017-09-14 NOTE — Addendum Note (Signed)
Addended by: Lester [place] A on: 09/14/2017 02:23 PM   Modules accepted: Orders

## 2017-09-15 ENCOUNTER — Other Ambulatory Visit: Payer: Self-pay | Admitting: Family Medicine

## 2017-09-15 DIAGNOSIS — E039 Hypothyroidism, unspecified: Secondary | ICD-10-CM

## 2017-09-28 IMAGING — NM NM MISC PROCEDURE
6 series · 36 of 36 positions shown · non-contrast
Comparison: none

[Series 1: wbr_r-proj_st wbr rest · 6.40mm/px · 6 of 64 frames shown]
[frame 6/64]
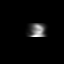
[frame 16/64]
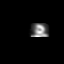
[frame 27/64]
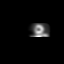
[frame 38/64]
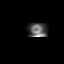
[frame 48/64]
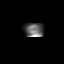
[frame 59/64]
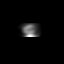

[Series 1: wbr rest · 6.40mm/px · 6 of 64 frames shown]
[frame 6/64]
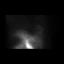
[frame 16/64]
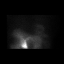
[frame 27/64]
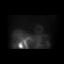
[frame 38/64]
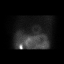
[frame 48/64]
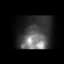
[frame 59/64]
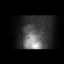

[Series 2: wbr_s-proj_st wbr stress-gsp · 6.40mm/px · 6 of 512 frames shown]
[frame 43/512]
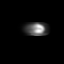
[frame 128/512]
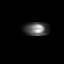
[frame 214/512]
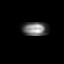
[frame 299/512]
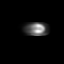
[frame 384/512]
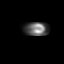
[frame 470/512]
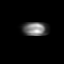

[Series 2: wbr stress-gsp · 6.40mm/px · 6 of 512 frames shown]
[frame 43/512]
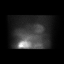
[frame 128/512]
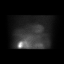
[frame 214/512]
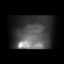
[frame 299/512]
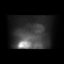
[frame 384/512]
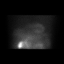
[frame 470/512]
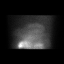

[Series 3: wbr stress-sum-em · 6.40mm/px · 6 of 64 frames shown]
[frame 6/64]
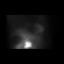
[frame 16/64]
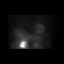
[frame 27/64]
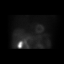
[frame 38/64]
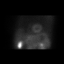
[frame 48/64]
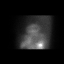
[frame 59/64]
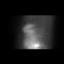

[Series 3: wbr_s-proj_st wbr stress-sum-em · 6.40mm/px · 6 of 64 frames shown]
[frame 6/64]
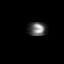
[frame 16/64]
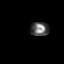
[frame 27/64]
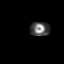
[frame 38/64]
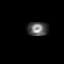
[frame 48/64]
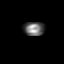
[frame 59/64]
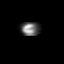

[36 of 36 positions shown; findings below may reference images not displayed]

Canned report from images found in remote index.

Refer to host system for actual result text.

## 2017-10-26 ENCOUNTER — Other Ambulatory Visit: Payer: Medicare Other

## 2017-11-08 ENCOUNTER — Ambulatory Visit
Admission: RE | Admit: 2017-11-08 | Discharge: 2017-11-08 | Disposition: A | Discharge status: Home / Self Care | Payer: Medicare Other | Source: Ambulatory Visit | Attending: Neurology | Admitting: Neurology

## 2017-11-08 ENCOUNTER — Other Ambulatory Visit (INDEPENDENT_AMBULATORY_CARE_PROVIDER_SITE_OTHER): Payer: Medicare Other

## 2017-11-08 DIAGNOSIS — R4181 Age-related cognitive decline: Secondary | ICD-10-CM | POA: Diagnosis not present

## 2017-11-08 DIAGNOSIS — E039 Hypothyroidism, unspecified: Secondary | ICD-10-CM | POA: Diagnosis not present

## 2017-11-08 LAB — T4, FREE: FREE T4: 0.99 ng/dL (ref 0.60–1.60)

## 2017-11-08 LAB — T3, FREE: T3 FREE: 2.6 pg/mL (ref 2.3–4.2)

## 2017-11-08 LAB — TSH: TSH: 3.91 u[IU]/mL (ref 0.35–4.50)

## 2017-11-09 ENCOUNTER — Other Ambulatory Visit: Payer: Self-pay | Admitting: *Deleted

## 2017-11-09 MED ORDER — LEVOTHYROXINE SODIUM 88 MCG PO TABS: ORAL_TABLET | ORAL | 3 refills | Status: DC

## 2017-11-11 ENCOUNTER — Other Ambulatory Visit: Payer: Medicare Other

## 2017-12-09 ENCOUNTER — Ambulatory Visit: Payer: Medicare Other | Admitting: Neurology

## 2018-01-05 ENCOUNTER — Encounter: Payer: Self-pay | Admitting: Family Medicine

## 2018-01-26 ENCOUNTER — Telehealth: Payer: Self-pay | Admitting: Neurology

## 2018-01-26 NOTE — Telephone Encounter (Addendum)
Spoke to the patient's daughter.  She has concerns about her mother's worsening memory.  She has been forgetful in taking her medications and has been experiencing increased confusion.  She has been driving short distances but her daughter is now worried this may no longer be safe.  The patient was last evaluated on 03/31/17. She has a pending appt on 03/10/18.  Her daughter would like to keep this appt date but wanted to share this information prior to that day so it would be recorded.  Her daughter prepares her weekly medications in a pill organizer.  She only lives three houses down from her mother and I have suggested she supervise her taking the medications.  Also, if they do not feel comfortable with her driving they need to discuss this with her directly.  If she is not compliant with their request and truly unsafe, they may want to consider taking her keys or contacting the [facility] for a driving safety re-evaluation.

## 2018-01-26 NOTE — Telephone Encounter (Signed)
I have attempted to return the call at the number provided below.  It has rang busy multiple times.  I will continue trying to get in touch with the patient's family.

## 2018-01-26 NOTE — Telephone Encounter (Signed)
Pt's daughter/[name] DPR said the patient over past 2-3 mths is having a lot more confusion, she is not taking her medication as directed-(missing some doses sometimes), missing perception of time sometimes, seems like short term is getting worse. [name] and her husband is not comfortable with the patient driving anymore. Please call to discuss, she is aware Dr Krista Blue is not in the office but would like to touch base with RN.

## 2018-03-10 ENCOUNTER — Encounter: Payer: Self-pay | Admitting: Neurology

## 2018-03-10 ENCOUNTER — Encounter

## 2018-03-10 ENCOUNTER — Ambulatory Visit (INDEPENDENT_AMBULATORY_CARE_PROVIDER_SITE_OTHER): Payer: Medicare Other | Admitting: Neurology

## 2018-03-10 VITALS — BP 122/71 | HR 70 | Ht 63.5 in | Wt 163.0 lb

## 2018-03-10 DIAGNOSIS — G3184 Mild cognitive impairment, so stated: Secondary | ICD-10-CM | POA: Diagnosis not present

## 2018-03-10 MED ORDER — DONEPEZIL HCL 10 MG PO TABS: 10.0000 mg | ORAL_TABLET | Freq: Every day | ORAL | 4 refills | Status: DC

## 2018-03-10 MED ORDER — MEMANTINE HCL 10 MG PO TABS: 10.0000 mg | ORAL_TABLET | Freq: Two times a day (BID) | ORAL | 4 refills | Status: DC
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
## 2018-04-07 ENCOUNTER — Telehealth: Payer: Self-pay | Admitting: Neurology

## 2018-04-07 NOTE — Telephone Encounter (Addendum)
Spoke to patient's daughter on HIPAA.  States her mother was upset at her last appt because it was recommended that she no longer drive.  She wanted to clarify the recent MRI results from 11/08/17.    MRI brain w/o: IMPRESSION: This MRI of the brain without contrast shows the following: 1.    Brain volume is normal for age. 2.    Scattered T2/FLAIR hyperintense foci in the subcortical and deep white matter consistent with age-appropriate minimal chronic microvascular ischemic change, unchanged when compared to the MRI from 2016. 3.    There are no acute findings.      Results reviewed with her and she had no further questions.

## 2018-04-07 NOTE — Telephone Encounter (Signed)
Pt daughter(on DPR-Gill,[name]) has called, she states that she and pt were unable to really complete the appointment re: the MRI results due to pt becoming upset.  Daughter is asking for a call so that she can hear Dr Krista Blue out re: a full explanation of the MRI results please call , she states she is also open to the idea of coming in by herself to speak directly with Dr Krista Blue if that would be preferred.  Please call

## 2018-04-07 NOTE — Telephone Encounter (Signed)
I have attempted to return the call multiple times.  The line is ringing busy.  I will continue trying to reach.

## 2018-05-13 DIAGNOSIS — R87610 Atypical squamous cells of undetermined significance on cytologic smear of cervix (ASC-US): Secondary | ICD-10-CM

## 2018-05-13 HISTORY — DX: Atypical squamous cells of undetermined significance on cytologic smear of cervix (ASC-US): R87.610

## 2018-05-16 ENCOUNTER — Ambulatory Visit (INDEPENDENT_AMBULATORY_CARE_PROVIDER_SITE_OTHER): Payer: Medicare Other | Admitting: Gynecology

## 2018-05-16 ENCOUNTER — Encounter: Payer: Self-pay | Admitting: Gynecology

## 2018-05-16 VITALS — BP 124/78 | Ht 65.0 in | Wt 164.0 lb

## 2018-05-16 DIAGNOSIS — N814 Uterovaginal prolapse, unspecified: Secondary | ICD-10-CM | POA: Diagnosis not present

## 2018-05-16 DIAGNOSIS — N3941 Urge incontinence: Secondary | ICD-10-CM

## 2018-05-16 DIAGNOSIS — N3000 Acute cystitis without hematuria: Secondary | ICD-10-CM

## 2018-05-16 DIAGNOSIS — Z124 Encounter for screening for malignant neoplasm of cervix: Secondary | ICD-10-CM

## 2018-05-16 DIAGNOSIS — Z4689 Encounter for fitting and adjustment of other specified devices: Secondary | ICD-10-CM

## 2018-05-16 MED ORDER — NITROFURANTOIN MONOHYD MACRO 100 MG PO CAPS: 100.0000 mg | ORAL_CAPSULE | Freq: Two times a day (BID) | ORAL | 0 refills | Status: DC

## 2018-05-16 NOTE — Progress Notes (Signed)
    MIRYAM MCELHINNEY 1945-12-27 [id number]        72 y.o.  G1P1 new patient presents with her daughter complaining of urinary incontinence and vaginal pressure.  Over the past year the patient has noticed increasing symptoms of urinary urgency and ultimate incontinence if she cannot get to the bathroom in time.  No stress symptoms of loss of urine with coughing sneezing laughing or straining.  No unperceived spontaneous leakage.  No dysuria low back pain fever or chills.  Also has noticed some pressure vaginally and wonders whether her bladder had dropped.  No history of gynecologic issues in the past.  Last Pap smear a number of years ago.  Past medical history,surgical history, problem list, medications, allergies, family history and social history were all reviewed and documented in the EPIC chart.  Directed ROS with pertinent positives and negatives documented in the history of present illness/assessment and plan.  Exam: [name] assistant Vitals:   05/16/18 1152  BP: 124/78  Weight: 164 lb (74.4 kg)  Height: 5\' 5"  (1.651 m)   General appearance:  Normal Spine straight without CVA tenderness Abdomen soft nontender without masses guarding rebound Pelvic external BUS vagina with atrophic changes.  Cervix at the introital opening.  No significant isolated cystocele or rectocele.  Uterus grossly normal size midline mobile nontender.  Adnexa without masses or tenderness.  Rectal exam confirms above without significant rectocele.  Procedure: The patient underwent pessary fitting as described below with #3 ring pessary with support.  She ambulated/squatted without discomfort or dislodging of the pessary.  Assessment/Plan:  72 y.o. G1P1 with:  1. Urinary urgency with associated incontinence.  Urine analysis is consistent with UTI showing many bacteria 10-20 RBC 40-60 WBC and 6-10 epi.  She did undergo hip replacement earlier this year and I wonder whether she is been having a low-grade UTI  since as I suspect she was catheterized.  We will go ahead and treat her with Macrobid 100 mg twice daily x7 days.  We did discuss if continued urgency symptoms the pathophysiology of this and options to include avoiding bladder stimulants such as caffeine, darker beverages, spicy foods etc.  I also discussed OAB medication with her and her daughter.  At this point they want to monitor and see how she responds with antibiotic treatment and treatment of her uterine prolapse. 2. Uterine prolapse.  No significant associated cystocele or rectocele.  We discussed options to include expectant management, trial of pessary as well as surgery.  The pros and cons of each choice was discussed.  At this point the patient desires a trial of pessary.  She is not sexually active.  She was fitted with a #3 ring pessary with support as above.  Patient will return when this is available for placement.  I spent a total of 45 face-to-face minutes with the patient, over 50% was spent counseling and coordination of care.    Anastasio Auerbach MD, 12:38 PM 05/16/2018

## 2018-05-16 NOTE — Patient Instructions (Signed)
Follow-up for pessary placement as scheduled.

## 2018-05-16 NOTE — Addendum Note (Signed)
Addended by: Nelva Nay on: 05/16/2018 12:47 PM   Modules accepted: Orders

## 2018-05-16 NOTE — Addendum Note (Signed)
Addended by: Nelva Nay on: 05/16/2018 02:02 PM   Modules accepted: Orders

## 2018-05-18 ENCOUNTER — Encounter: Payer: Self-pay | Admitting: Gynecology

## 2018-05-18 LAB — URINALYSIS, COMPLETE W/RFL CULTURE
Bilirubin Urine: NEGATIVE
GLUCOSE, UA: NEGATIVE
HYALINE CAST: NONE SEEN /LPF
Ketones, ur: NEGATIVE
Nitrites, Initial: POSITIVE — AB
Specific Gravity, Urine: 1.02 (ref 1.001–1.03)
pH: 7 (ref 5.0–8.0)

## 2018-05-18 LAB — URINE CULTURE
MICRO NUMBER:: 91326526
SPECIMEN QUALITY:: ADEQUATE

## 2018-05-18 LAB — CULTURE INDICATED

## 2018-05-18 LAB — PAP IG W/ RFLX HPV ASCU

## 2018-05-18 LAB — HUMAN PAPILLOMAVIRUS, HIGH RISK: HPV DNA HIGH RISK: NOT DETECTED

## 2018-05-19 ENCOUNTER — Encounter: Payer: Self-pay | Admitting: Gynecology

## 2018-05-27 ENCOUNTER — Encounter: Payer: Self-pay | Admitting: Gynecology

## 2018-05-27 ENCOUNTER — Ambulatory Visit (INDEPENDENT_AMBULATORY_CARE_PROVIDER_SITE_OTHER): Payer: Medicare Other | Admitting: Gynecology

## 2018-05-27 VITALS — BP 108/68

## 2018-05-27 DIAGNOSIS — Z4689 Encounter for fitting and adjustment of other specified devices: Secondary | ICD-10-CM | POA: Diagnosis not present

## 2018-05-27 DIAGNOSIS — N8189 Other female genital prolapse: Secondary | ICD-10-CM | POA: Diagnosis not present

## 2018-05-27 NOTE — Patient Instructions (Signed)
Follow-up in 1 to 2 months for reexamination

## 2018-05-27 NOTE — Progress Notes (Signed)
    Monica Irwin 1946/06/22 [id number]        72 y.o.  G1P1 with history of uterine prolapse.  Was fitted with #3 ring pessary with support.  Returns now for placement.  Past medical history,surgical history, problem list, medications, allergies, family history and social history were all reviewed and documented in the EPIC chart.  Directed ROS with pertinent positives and negatives documented in the history of present illness/assessment and plan.  Exam: Sharrie Rothman assistant Vitals:   05/27/18 1547  BP: 108/68   General appearance:  Normal Abdomen soft nontender without masses guarding rebound Pelvic external BUS vagina with atrophic changes.  Bimanual exam without masses or tenderness.  Ring pessary was placed without difficulty.  The patient ambulated/squatted and did well without feeling the pessary which remained in place.  Assessment/Plan:  72 y.o. G1P1 for pessary placement for uterine prolapse.  Will follow-up in 1 to 2 months for pessary recheck.  Will call sooner if she has any questions or issues.    Anastasio Auerbach MD, 4:02 PM 05/27/2018

## 2018-09-15 ENCOUNTER — Ambulatory Visit (INDEPENDENT_AMBULATORY_CARE_PROVIDER_SITE_OTHER): Payer: Medicare Other | Admitting: Gynecology

## 2018-09-15 ENCOUNTER — Encounter: Payer: Self-pay | Admitting: Gynecology

## 2018-09-15 VITALS — BP 122/78

## 2018-09-15 DIAGNOSIS — Z4689 Encounter for fitting and adjustment of other specified devices: Secondary | ICD-10-CM

## 2018-09-15 DIAGNOSIS — N8189 Other female genital prolapse: Secondary | ICD-10-CM | POA: Diagnosis not present

## 2018-09-15 NOTE — Patient Instructions (Signed)
Follow-up in 3 to 6 months for pessary recheck

## 2018-09-15 NOTE — Progress Notes (Signed)
    Monica Irwin 1946-03-13 [id number]        73 y.o.  G1P1 presents for pessary check.  Had ring pessary placed 05/2018 for uterine prolapse.  She has done well with no pain or bleeding.  Past medical history,surgical history, problem list, medications, allergies, family history and social history were all reviewed and documented in the EPIC chart.  Directed ROS with pertinent positives and negatives documented in the history of present illness/assessment and plan.  Exam: [name] assistant Vitals:   09/15/18 1611  BP: 122/78   General appearance:  Normal Abdomen soft nontender without masses guarding rebound Pelvic external BUS vagina normal with atrophic changes.  No evidence of irritation or erosion from the pessary.  Her ring pessary was removed cleansed and replaced without difficulty.  Cervix with atrophic changes.  Uterus grossly normal midline mobile nontender.  Adnexa without masses or tenderness.  Assessment/Plan:  73 y.o. G1P1 with pessary recheck doing well.  Recommend follow-up 3 to 6 months for pessary check.  Sooner if any issues.    Anastasio Auerbach MD, 4:22 PM 09/15/2018

## 2018-12-06 ENCOUNTER — Telehealth: Payer: Self-pay

## 2018-12-06 MED ORDER — AMOXICILLIN 500 MG PO CAPS: ORAL_CAPSULE | ORAL | 2 refills | Status: DC

## 2018-12-06 NOTE — Telephone Encounter (Signed)
Mr. Gordy Levan notified by telephone that antibiotics have been sent to [facility] as requested.

## 2018-12-06 NOTE — Telephone Encounter (Signed)
Done. Sent in amox 2 grams, 1 hour before dental work

## 2018-12-06 NOTE — Telephone Encounter (Signed)
[name] pts daughter (DPR signed) left v/m requesting rx for amoxicillin for heart condition prior to having any dental work done. Pt has cleaning scheduled next wk. [place] request cb. [facility]. 09/13/17 last FU.

## 2019-01-16 ENCOUNTER — Other Ambulatory Visit: Payer: Self-pay

## 2019-01-17 ENCOUNTER — Ambulatory Visit (INDEPENDENT_AMBULATORY_CARE_PROVIDER_SITE_OTHER): Payer: Medicare Other | Admitting: Gynecology

## 2019-01-17 ENCOUNTER — Encounter: Payer: Self-pay | Admitting: Gynecology

## 2019-01-17 ENCOUNTER — Other Ambulatory Visit: Payer: Self-pay | Admitting: Family Medicine

## 2019-01-17 VITALS — BP 138/74

## 2019-01-17 DIAGNOSIS — N814 Uterovaginal prolapse, unspecified: Secondary | ICD-10-CM

## 2019-01-17 DIAGNOSIS — Z1231 Encounter for screening mammogram for malignant neoplasm of breast: Secondary | ICD-10-CM

## 2019-01-17 DIAGNOSIS — Z4689 Encounter for fitting and adjustment of other specified devices: Secondary | ICD-10-CM

## 2019-01-17 NOTE — Patient Instructions (Signed)
Follow-up in 3 to 6 months for pessary recheck.  Sooner if any issues.

## 2019-01-17 NOTE — Progress Notes (Signed)
    Monica Irwin Jan 04, 1946 [id number]        73 y.o.  G1P1 presents for pessary follow-up exam.  History of uterine prolapse with ring pessary doing well without complaints.  Past medical history,surgical history, problem list, medications, allergies, family history and social history were all reviewed and documented in the EPIC chart.  Directed ROS with pertinent positives and negatives documented in the history of present illness/assessment and plan.  Exam: Monica Irwin assistant Vitals:   01/17/19 1131  BP: 138/74   General appearance:  Normal Abdomen soft nontender without masses guarding rebound Pelvic external BUS vagina with atrophic changes.  Ring pessary removed.  Vaginal mucosa without significant irritation or erosion.  Cervix with atrophic changes.  Uterus grossly normal size midline mobile nontender.  Adnexa without masses or tenderness.  Ring pessary was cleansed and replaced without difficulty.  Assessment/Plan:  73 y.o. G1P1 using ring pessary for uterine prolapse with good results.  No evidence of significant irritation or erosion.  Follow-up in 3 to 6 months for pessary recheck.    Monica Auerbach MD, 11:57 AM 01/17/2019

## 2019-02-07 ENCOUNTER — Other Ambulatory Visit: Payer: Self-pay | Admitting: Family Medicine

## 2019-02-08 ENCOUNTER — Telehealth: Payer: Self-pay | Admitting: Neurology

## 2019-02-08 ENCOUNTER — Other Ambulatory Visit: Payer: Self-pay

## 2019-02-08 ENCOUNTER — Other Ambulatory Visit: Payer: Medicare Other

## 2019-02-08 NOTE — Telephone Encounter (Signed)
Pt daughter(on DPR)has called asking for assistance in finding an assisted living facility for pt.  Please call after 3pm

## 2019-02-08 NOTE — Telephone Encounter (Signed)
I spoke to her daughter who may be looking for a placement for her mother soon.  She is going to start touring facilities.  She is paying a Sales promotion account executive to help with her daily care now.  She is asking for some resources.  We have a packet of resources for our dementia patients.  I have put it in the mail to her today.  [facility]  [address]  [place], [place]  [address]

## 2019-02-08 NOTE — Telephone Encounter (Signed)
I attempted to reach patient's daughter but she was unavailable.  I will try again after 3pm today, as requested.

## 2019-02-09 ENCOUNTER — Other Ambulatory Visit: Payer: Self-pay

## 2019-02-09 ENCOUNTER — Other Ambulatory Visit (INDEPENDENT_AMBULATORY_CARE_PROVIDER_SITE_OTHER): Payer: Medicare Other

## 2019-02-09 DIAGNOSIS — Z79899 Other long term (current) drug therapy: Secondary | ICD-10-CM

## 2019-02-09 DIAGNOSIS — E785 Hyperlipidemia, unspecified: Secondary | ICD-10-CM

## 2019-02-09 DIAGNOSIS — E038 Other specified hypothyroidism: Secondary | ICD-10-CM

## 2019-02-09 DIAGNOSIS — R7309 Other abnormal glucose: Secondary | ICD-10-CM | POA: Diagnosis not present

## 2019-02-09 LAB — CBC WITH DIFFERENTIAL/PLATELET
Basophils Absolute: 0 10*3/uL (ref 0.0–0.1)
Basophils Relative: 0.5 % (ref 0.0–3.0)
Eosinophils Absolute: 0.1 10*3/uL (ref 0.0–0.7)
Eosinophils Relative: 1.8 % (ref 0.0–5.0)
HCT: 46.9 % — ABNORMAL HIGH (ref 36.0–46.0)
Hemoglobin: 15.4 g/dL — ABNORMAL HIGH (ref 12.0–15.0)
Lymphocytes Relative: 19.1 % (ref 12.0–46.0)
Lymphs Abs: 1 10*3/uL (ref 0.7–4.0)
MCHC: 32.8 g/dL (ref 30.0–36.0)
MCV: 94.2 fl (ref 78.0–100.0)
Monocytes Absolute: 0.5 10*3/uL (ref 0.1–1.0)
Monocytes Relative: 9.4 % (ref 3.0–12.0)
Neutro Abs: 3.7 10*3/uL (ref 1.4–7.7)
Neutrophils Relative %: 69.2 % (ref 43.0–77.0)
Platelets: 242 10*3/uL (ref 150.0–400.0)
RBC: 4.98 Mil/uL (ref 3.87–5.11)
RDW: 14.1 % (ref 11.5–15.5)
WBC: 5.3 10*3/uL (ref 4.0–10.5)

## 2019-02-09 LAB — HEPATIC FUNCTION PANEL
ALT: 16 U/L (ref 0–35)
AST: 18 U/L (ref 0–37)
Albumin: 4.2 g/dL (ref 3.5–5.2)
Alkaline Phosphatase: 74 U/L (ref 39–117)
Bilirubin, Direct: 0.1 mg/dL (ref 0.0–0.3)
Total Bilirubin: 0.5 mg/dL (ref 0.2–1.2)
Total Protein: 6.4 g/dL (ref 6.0–8.3)

## 2019-02-09 LAB — LIPID PANEL
Cholesterol: 226 mg/dL — ABNORMAL HIGH (ref 0–200)
HDL: 60.1 mg/dL (ref >39.00)
LDL Cholesterol: 140 mg/dL — ABNORMAL HIGH (ref 0–99)
NonHDL: 165.83
Total CHOL/HDL Ratio: 4
Triglycerides: 129 mg/dL (ref 0.0–149.0)
VLDL: 25.8 mg/dL (ref 0.0–40.0)

## 2019-02-09 LAB — BASIC METABOLIC PANEL
BUN: 17 mg/dL (ref 6–23)
CO2: 30 mEq/L (ref 19–32)
Calcium: 9.3 mg/dL (ref 8.4–10.5)
Chloride: 105 mEq/L (ref 96–112)
Creatinine, Ser: 0.91 mg/dL (ref 0.40–1.20)
GFR: 60.23 mL/min (ref >60.00)
Glucose, Bld: 110 mg/dL — ABNORMAL HIGH (ref 70–99)
Potassium: 4.5 mEq/L (ref 3.5–5.1)
Sodium: 143 mEq/L (ref 135–145)

## 2019-02-09 LAB — T4, FREE: Free T4: 0.91 ng/dL (ref 0.60–1.60)

## 2019-02-09 LAB — HEMOGLOBIN A1C: Hgb A1c MFr Bld: 6.2 % (ref 4.6–6.5)

## 2019-02-09 LAB — T3, FREE: T3, Free: 2.4 pg/mL (ref 2.3–4.2)

## 2019-02-09 LAB — TSH: TSH: 9.25 u[IU]/mL — ABNORMAL HIGH (ref 0.35–4.50)

## 2019-02-13 ENCOUNTER — Other Ambulatory Visit: Payer: Self-pay

## 2019-02-13 ENCOUNTER — Ambulatory Visit (INDEPENDENT_AMBULATORY_CARE_PROVIDER_SITE_OTHER): Payer: Medicare Other | Admitting: Family Medicine

## 2019-02-13 ENCOUNTER — Encounter: Payer: Self-pay | Admitting: Family Medicine

## 2019-02-13 VITALS — BP 138/86 | HR 76 | Temp 98.0°F | Ht 63.75 in | Wt 159.5 lb

## 2019-02-13 DIAGNOSIS — E785 Hyperlipidemia, unspecified: Secondary | ICD-10-CM

## 2019-02-13 DIAGNOSIS — Z0000 Encounter for general adult medical examination without abnormal findings: Secondary | ICD-10-CM | POA: Diagnosis not present

## 2019-02-13 MED ORDER — ROSUVASTATIN CALCIUM 10 MG PO TABS: 10.0000 mg | ORAL_TABLET | Freq: Every day | ORAL | 1 refills | Status: DC

## 2019-02-13 MED ORDER — LEVOTHYROXINE SODIUM 88 MCG PO TABS: ORAL_TABLET | ORAL | 3 refills | Status: DC

## 2019-02-13 NOTE — Progress Notes (Signed)
Monica Loman T. Jodey Burbano, MD Primary Care and [place] at [facility] [place], [address] Phone: [phone]  FAX: [place] - 73 y.o. female  MRN [id number]  Date of Birth: March 05, 1946  Visit Date: 02/13/2019  PCP: Monica Loffler, MD  Referred by: Monica Loffler, MD  Chief Complaint  Patient presents with  . Medicare Wellness   Patient Care Team: Monica Loffler, MD as PCP - General Subjective:   Monica Irwin is a 73 y.o. pleasant patient who presents for a medicare wellness examination and CPX:  Health Maintenance Summary Reviewed and updated, unless pt declines services.  Tobacco History Reviewed. Non-smoker Alcohol: No concerns, no excessive use Exercise Habits: Some activity, rec at least 30 mins 5 times a week STD concerns: none Drug Use: None Birth control method: n/a Menses regular: n/a Lumps or breast concerns: no Breast Cancer Family History: no  The 10-year ASCVD risk score Monica Irwin., et al., 2013) is: 18.6%   Values used to calculate the score:     Age: 73 years     Sex: Female     Is Non-Hispanic African American: No     Diabetic: No     Tobacco smoker: No     Systolic Blood Pressure: 951 mmHg     Is BP treated: No     HDL Cholesterol: 60.1 mg/dL     Total Cholesterol: 226 mg/dL    Health Maintenance  Topic Date Due  . INFLUENZA VACCINE  02/11/2019  . COLONOSCOPY  10/29/2020  . TETANUS/TDAP  01/21/2027  . DEXA SCAN  Completed  . Hepatitis C Screening  Completed  . PNA vac Low Risk Adult  Completed   Immunization History  Administered Date(s) Administered  . Influenza Whole 04/10/2009, 03/13/2010  . Influenza,inj,Quad PF,6+ Mos 04/22/2015, 03/30/2016, 05/24/2017  . Influenza-Unspecified 04/12/2013  . Pneumococcal Conjugate-13 11/08/2013  . Pneumococcal Polysaccharide-23 05/22/2010  . Td 07/13/2001  . Tdap 01/20/2017  . Zoster 07/13/2001    Patient  Active Problem List   Diagnosis Date Noted  . Primary osteoarthritis of right hip 08/19/2017  . Mild cognitive impairment 10/19/2016  . Injury of right rotator cuff 08/20/2016  . History of colonic polyps 10/15/2015  . AVNRT (AV nodal re-entry tachycardia) ([place]) 08/19/2015  . Aortic calcification ([place]) 04/22/2015  . Major depressive disorder, recurrent episode, in full remission ([place]) 07/24/2010  . OSTEOPOROSIS 07/24/2010  . INSOMNIA 08/29/2009  . Hyperlipidemia with target LDL less than 100 12/19/2008  . Hypothyroidism 07/02/2008  . OSTEOARTHRITIS 07/02/2008  . NEPHROLITHIASIS, HX OF 07/02/2008   Past Medical History:  Diagnosis Date  . ASCUS of cervix with negative high risk HPV 05/2018   Repeat Pap smear one year interval  . AVNRT (AV nodal re-entry tachycardia) ([place]) 08/19/2015   S/p successful ablation 07/2015.  [name] Dysrhythmia    hx of SVT - 2017   . History of colonic polyps   . History of kidney stones   . Hypothyroidism   . Liver cyst   . Memory loss   . Nephrolithiasis   . Osteoarthritis   . Osteoporosis   . SVT (supraventricular tachycardia) ([place]) 05/07/2015   Past Surgical History:  Procedure Laterality Date  . ABLATION    . BACK SURGERY     [phone]  . ELECTROPHYSIOLOGIC STUDY N/A 07/22/2015   Procedure: SVT Ablation;  Surgeon: Will Meredith Leeds, MD;  Location: [facility];  Service: Cardiovascular;  Laterality: N/A;  . MM BREAST STEREO BIOPSY LEFT ([place] HX)    . PARTIAL COLECTOMY  2005  . SMALL INTESTINE SURGERY    . THYROIDECTOMY  2008  . TOTAL HIP ARTHROPLASTY Right 08/19/2017   Procedure: RIGHT TOTAL HIP ARTHROPLASTY ANTERIOR APPROACH;  Surgeon: Monica Can, MD;  Location: [facility];  Service: Orthopedics;  Laterality: Right;   Social History   Socioeconomic History  . Marital status: Divorced    Spouse name: Not on file  . Number of children: 1  . Years of education: HS  . Highest education level: Not on file  Occupational History  .  Occupation: retired [employer]: RETIRED  Social Needs  . Financial resource strain: Not on file  . Food insecurity    Worry: Not on file    Inability: Not on file  . Transportation needs    Medical: Not on file    Non-medical: Not on file  Tobacco Use  . Smoking status: Never Smoker  . Smokeless tobacco: Never Used  Substance and Sexual Activity  . Alcohol use: No    Alcohol/week: 0.0 standard drinks    Frequency: Never  . Drug use: No  . Sexual activity: Never  Lifestyle  . Physical activity    Days per week: Not on file    Minutes per session: Not on file  . Stress: Not on file  Relationships  . Social Herbalist on phone: Not on file    Gets together: Not on file    Attends religious service: Not on file    Active member of club or organization: Not on file    Attends meetings of clubs or organizations: Not on file    Relationship status: Not on file  . Intimate partner violence    Fear of current or ex partner: Not on file    Emotionally abused: Not on file    Physically abused: Not on file    Forced sexual activity: Not on file  Other Topics Concern  . Not on file  Social History Narrative   Regular exercise--no   Lives at home alone.   Right-handed.   1 cup caffeine daily.            Epworth Sleepiness Scale      Total: 4               Family History  Problem Relation Age of Onset  . Stroke Mother   . Alzheimer's disease Mother   . Heart failure Brother   . Alzheimer's disease Sister   . Other Father        natural causes  . Heart attack Brother   . Colon cancer Unknown        nephew  . Ulcerative colitis Sister        colon removed   No Known Allergies  Medication list has been reviewed and updated.  Results for orders placed or performed in visit on 02/09/19  Lipid panel  Result Value Ref Range   Cholesterol 226 (H) 0 - 200 mg/dL   Triglycerides 129.0 0.0 - 149.0 mg/dL   HDL 60.10  >39.00 mg/dL   VLDL 25.8 0.0 - 40.0 mg/dL   LDL Cholesterol 140 (H) 0 - 99 mg/dL   Total CHOL/HDL Ratio 4    NonHDL 165.83   Hepatic function panel  Result Value Ref Range   Total Bilirubin 0.5 0.2 - 1.2 mg/dL  Bilirubin, Direct 0.1 0.0 - 0.3 mg/dL   Alkaline Phosphatase 74 39 - 117 U/L   AST 18 0 - 37 U/L   ALT 16 0 - 35 U/L   Total Protein 6.4 6.0 - 8.3 g/dL   Albumin 4.2 3.5 - 5.2 g/dL  Basic metabolic panel  Result Value Ref Range   Sodium 143 135 - 145 mEq/L   Potassium 4.5 3.5 - 5.1 mEq/L   Chloride 105 96 - 112 mEq/L   CO2 30 19 - 32 mEq/L   Glucose, Bld 110 (H) 70 - 99 mg/dL   BUN 17 6 - 23 mg/dL   Creatinine, Ser 0.91 0.40 - 1.20 mg/dL   Calcium 9.3 8.4 - 10.5 mg/dL   GFR 60.23 >60.00 mL/min  CBC with Differential/Platelet  Result Value Ref Range   WBC 5.3 4.0 - 10.5 K/uL   RBC 4.98 3.87 - 5.11 Mil/uL   Hemoglobin 15.4 (H) 12.0 - 15.0 g/dL   HCT 46.9 (H) 36.0 - 46.0 %   MCV 94.2 78.0 - 100.0 fl   MCHC 32.8 30.0 - 36.0 g/dL   RDW 14.1 11.5 - 15.5 %   Platelets 242.0 150.0 - 400.0 K/uL   Neutrophils Relative % 69.2 43.0 - 77.0 %   Lymphocytes Relative 19.1 12.0 - 46.0 %   Monocytes Relative 9.4 3.0 - 12.0 %   Eosinophils Relative 1.8 0.0 - 5.0 %   Basophils Relative 0.5 0.0 - 3.0 %   Neutro Abs 3.7 1.4 - 7.7 K/uL   Lymphs Abs 1.0 0.7 - 4.0 K/uL   Monocytes Absolute 0.5 0.1 - 1.0 K/uL   Eosinophils Absolute 0.1 0.0 - 0.7 K/uL   Basophils Absolute 0.0 0.0 - 0.1 K/uL  Hemoglobin A1c  Result Value Ref Range   Hgb A1c MFr Bld 6.2 4.6 - 6.5 %  TSH  Result Value Ref Range   TSH 9.25 (H) 0.35 - 4.50 uIU/mL  T3, free  Result Value Ref Range   T3, Free 2.4 2.3 - 4.2 pg/mL  T4, free  Result Value Ref Range   Free T4 0.91 0.60 - 1.60 ng/dL    Objective:   BP 138/86   Pulse 76   Temp 98 F (36.7 C) (Temporal)   Ht 5' 3.75" (1.619 m)   Wt 159 lb 8 oz (72.3 kg)   SpO2 96%   BMI 27.59 kg/m  Fall Risk  02/13/2019 05/24/2017 03/30/2016 02/13/2015 11/08/2013   Falls in the past year? 0 No No No No   Ideal Body Weight: Weight in (lb) to have BMI = 25: 144.2  Hearing Screening   Method: Audiometry   '125Hz'  '250Hz'  '500Hz'  '1000Hz'  '2000Hz'  '3000Hz'  '4000Hz'  '6000Hz'  '8000Hz'   Right ear:   '20 20 20  ' 0    Left ear:   '20 20 20  ' 0      Visual Acuity Screening   Right eye Left eye Both eyes  Without correction: '20/40 20/30 20/30 '  With correction:      Depression screen [facility] 2/9 02/13/2019 05/24/2017 03/30/2016 02/13/2015 11/08/2013  Decreased Interest 0 0 0 0 0  Down, Depressed, Hopeless 0 0 0 0 0  PHQ - 2 Score 0 0 0 0 0  Altered sleeping - 0 - - -  Tired, decreased energy - 0 - - -  Change in appetite - 0 - - -  Feeling bad or failure about yourself  - 0 - - -  Trouble concentrating - 0 - - -  Moving slowly or fidgety/restless - 0 - - -  Suicidal thoughts - 0 - - -  PHQ-9 Score - 0 - - -  Difficult doing work/chores - Not difficult at all - - -     GEN: well developed, well nourished, no acute distress Eyes: conjunctiva and lids normal, PERRLA, EOMI ENT: TM clear, nares clear, oral exam WNL Neck: supple, no lymphadenopathy, no thyromegaly, no JVD Pulm: clear to auscultation and percussion, respiratory effort normal CV: regular rate and rhythm, S1-S2, no murmur, rub or gallop, no bruits Chest: no scars, masses, no lumps BREAST: breast exam declined GI: soft, non-tender; no hepatosplenomegaly, masses; active bowel sounds all quadrants GU: GU exam declined Lymph: no cervical, axillary or inguinal adenopathy MSK: gait normal, muscle tone and strength WNL, no joint swelling, effusions, discoloration, crepitus  SKIN: clear, good turgor, color WNL, no rashes, lesions, or ulcerations Neuro: normal mental status, normal strength, sensation, and motion Psych: alert; oriented to person, place and time, normally interactive and not anxious or depressed in appearance.   All labs reviewed with patient. Lipids: Lab Results  Component Value Date   CHOL 226 (H)  02/09/2019   Lab Results  Component Value Date   HDL 60.10 02/09/2019   Lab Results  Component Value Date   LDLCALC 140 (H) 02/09/2019   Lab Results  Component Value Date   TRIG 129.0 02/09/2019   Lab Results  Component Value Date   CHOLHDL 4 02/09/2019   CBC: CBC Latest Ref Rng & Units 02/09/2019 08/21/2017 08/20/2017  WBC 4.0 - 10.5 K/uL 5.3 9.7 11.3(H)  Hemoglobin 12.0 - 15.0 g/dL 15.4(H) 12.2 13.3  Hematocrit 36.0 - 46.0 % 46.9(H) 38.2 40.5  Platelets 150.0 - 400.0 K/uL 242.0 187 914    Basic Metabolic Panel:    Component Value Date/Time   NA 143 02/09/2019 0745   NA 144 10/19/2016 1308   K 4.5 02/09/2019 0745   CL 105 02/09/2019 0745   CO2 30 02/09/2019 0745   BUN 17 02/09/2019 0745   BUN 22 10/19/2016 1308   CREATININE 0.91 02/09/2019 0745   CREATININE 0.79 07/16/2015 1228   GLUCOSE 110 (H) 02/09/2019 0745   CALCIUM 9.3 02/09/2019 0745   Hepatic Function Latest Ref Rng & Units 02/09/2019 05/24/2017 10/19/2016  Total Protein 6.0 - 8.3 g/dL 6.4 7.0 6.1  Albumin 3.5 - 5.2 g/dL 4.2 4.1 3.6  AST 0 - 37 U/L '18 20 14  ' ALT 0 - 35 U/L '16 24 12  ' Alk Phosphatase 39 - 117 U/L 74 75 68  Total Bilirubin 0.2 - 1.2 mg/dL 0.5 0.5 <0.2  Bilirubin, Direct 0.0 - 0.3 mg/dL 0.1 0.0 -    Lab Results  Component Value Date   HGBA1C 6.2 02/09/2019   Lab Results  Component Value Date   TSH 9.25 (H) 02/09/2019    No results found.  Assessment and Plan:     ICD-10-CM   1. Healthcare maintenance  Z00.00   2. Hyperlipidemia with target LDL less than 100  E78.5    With risk, start a statin  The 10-year ASCVD risk score Monica Irwin., et al., 2013) is: 18.6%   Values used to calculate the score:     Age: 63 years     Sex: Female     Is Non-Hispanic African American: No     Diabetic: No     Tobacco smoker: No     Systolic Blood Pressure: 782 mmHg     Is BP  treated: No     HDL Cholesterol: 60.1 mg/dL     Total Cholesterol: 226 mg/dL    Health Maintenance Exam: The  patient's preventative maintenance and recommended screening tests for an annual wellness exam were reviewed in full today. Brought up to date unless services declined.  Counselled on the importance of diet, exercise, and its role in overall health and mortality. The patient's FH and SH was reviewed, including their home life, tobacco status, and drug and alcohol status.  Follow-up in 1 year for physical exam or additional follow-up below.  I have personally reviewed the Medicare Annual Wellness questionnaire and have noted 1. The patient's medical and social history 2. Their use of alcohol, tobacco or illicit drugs 3. Their current medications and supplements 4. The patient's functional ability including ADL's, fall risks, home safety risks and hearing or visual             impairment. 5. Diet and physical activities 6. Evidence for depression or mood disorders 7. Reviewed Updated provider list, see scanned forms and [facility] Snapshot.  8. Reviewed whether or not the patient has HCPOA or living will, and discussed what this means with the patient.  Recommended she bring in a copy for his chart in [facility].  The patients weight, height, BMI and visual acuity have been recorded in the chart I have made referrals, counseling and provided education to the patient based review of the above and I have provided the pt with a written personalized care plan for preventive services.  I have provided the patient with a copy of your personalized plan for preventive services. Instructed to take the time to review along with their updated medication list.  Follow-up: No follow-ups on file. Or follow-up in 1 year unless noted.  Future Appointments  Date Time Provider [place]  04/05/2019 10:15 AM Suzzanne Cloud, NP [facility] None  06/22/2019 10:00 AM [facility] MM 2 [facility]    Meds ordered this encounter  Medications  . rosuvastatin (CRESTOR) 10 MG tablet    Sig: Take 1 tablet (10 mg  total) by mouth daily.    Dispense:  90 tablet    Refill:  1  . levothyroxine (SYNTHROID) 88 MCG tablet    Sig: TAKE 1 TABLET BY MOUTH EVERY MORNING 30 MINUTES BEFORE BREAKFAST.    Dispense:  90 tablet    Refill:  3   Medications Discontinued During This Encounter  Medication Reason  . amoxicillin (AMOXIL) 500 MG capsule Completed Course  . levothyroxine (SYNTHROID, LEVOTHROID) 88 MCG tablet Reorder   No orders of the defined types were placed in this encounter.   Signed,  Maud Deed. Errick Salts, MD   Allergies as of 02/13/2019   No Known Allergies     Medication List       Accurate as of February 13, 2019 11:59 PM. If you have any questions, ask your nurse or doctor.        STOP taking these medications   amoxicillin 500 MG capsule Commonly known as: AMOXIL Stopped by: Monica Loffler, MD     TAKE these medications   donepezil 10 MG tablet Commonly known as: ARICEPT Take 1 tablet (10 mg total) by mouth at bedtime.   fluocinonide ointment 0.05 % Commonly known as: LIDEX Apply 1 application topically 2 (two) times daily.   ibuprofen 200 MG tablet Commonly known as: ADVIL Take 400 mg by mouth daily as needed for moderate pain.   levothyroxine 88 MCG tablet Commonly  known as: SYNTHROID TAKE 1 TABLET BY MOUTH EVERY MORNING 30 MINUTES BEFORE BREAKFAST.   memantine 10 MG tablet Commonly known as: Namenda Take 1 tablet (10 mg total) by mouth 2 (two) times daily.   rosuvastatin 10 MG tablet Commonly known as: CRESTOR Take 1 tablet (10 mg total) by mouth daily. Started by: Monica Loffler, MD

## 2019-04-05 ENCOUNTER — Ambulatory Visit: Payer: Medicare Other | Admitting: Neurology

## 2019-04-20 ENCOUNTER — Encounter: Payer: Self-pay | Admitting: Gynecology

## 2019-05-10 ENCOUNTER — Ambulatory Visit: Payer: Medicare Other | Admitting: Neurology

## 2019-05-26 ENCOUNTER — Other Ambulatory Visit: Payer: Self-pay | Admitting: Neurology

## 2019-05-27 ENCOUNTER — Other Ambulatory Visit: Payer: Self-pay | Admitting: Neurology
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
## 2019-06-06 ENCOUNTER — Encounter: Payer: Self-pay | Admitting: Neurology

## 2019-06-06 ENCOUNTER — Ambulatory Visit (INDEPENDENT_AMBULATORY_CARE_PROVIDER_SITE_OTHER): Payer: Medicare Other | Admitting: Neurology

## 2019-06-06 ENCOUNTER — Other Ambulatory Visit: Payer: Self-pay

## 2019-06-06 VITALS — BP 122/70 | HR 76 | Temp 97.1°F | Ht 64.0 in | Wt 164.8 lb

## 2019-06-06 DIAGNOSIS — G3184 Mild cognitive impairment, so stated: Secondary | ICD-10-CM

## 2019-06-06 MED ORDER — DONEPEZIL HCL 10 MG PO TABS: ORAL_TABLET | ORAL | 3 refills | Status: DC

## 2019-06-06 NOTE — Patient Instructions (Signed)
Continue current medications. 

## 2019-06-22 ENCOUNTER — Ambulatory Visit
Admission: RE | Admit: 2019-06-22 | Discharge: 2019-06-22 | Disposition: A | Discharge status: Home / Self Care | Payer: Medicare Other | Source: Ambulatory Visit | Attending: Family Medicine | Admitting: Family Medicine

## 2019-06-22 ENCOUNTER — Other Ambulatory Visit: Payer: Self-pay

## 2019-06-22 DIAGNOSIS — Z1231 Encounter for screening mammogram for malignant neoplasm of breast: Secondary | ICD-10-CM

## 2019-06-28 NOTE — Progress Notes (Signed)
I have reviewed and agreed above plan. 

## 2019-06-29 ENCOUNTER — Ambulatory Visit: Payer: Medicare Other | Admitting: Gynecology

## 2019-06-29 ENCOUNTER — Encounter: Payer: Self-pay | Admitting: Gynecology

## 2019-06-29 ENCOUNTER — Other Ambulatory Visit: Payer: Self-pay

## 2019-06-29 ENCOUNTER — Ambulatory Visit (INDEPENDENT_AMBULATORY_CARE_PROVIDER_SITE_OTHER): Payer: Medicare Other | Admitting: Gynecology

## 2019-06-29 VITALS — BP 124/82

## 2019-06-29 DIAGNOSIS — N814 Uterovaginal prolapse, unspecified: Secondary | ICD-10-CM

## 2019-06-29 DIAGNOSIS — N3941 Urge incontinence: Secondary | ICD-10-CM | POA: Diagnosis not present

## 2019-06-29 NOTE — Patient Instructions (Signed)
Follow-up in 6 months for pessary recheck.  Try over-the-counter Oxytrol for the urinary urgency.

## 2019-06-29 NOTE — Progress Notes (Signed)
    Monica Irwin 1945/11/05 [id number]        73 y.o.  G1P1 presents with her daughter for pessary check.  Using ring pessary for uterine prolapse.  Also continues with urgency symptoms.  No incontinence unless she cannot get to the bathroom in time.  No UTI symptoms otherwise.  No stress incontinence symptoms.  Doing well with her pessary as far as no bleeding or pain.  Past medical history,surgical history, problem list, medications, allergies, family history and social history were all reviewed and documented in the EPIC chart.  Directed ROS with pertinent positives and negatives documented in the history of present illness/assessment and plan.  Exam: Monica Irwin assistant Vitals:   06/29/19 1144  BP: 124/82   General appearance:  Normal Abdomen soft nontender without masses guarding rebound Pelvic external BUS vagina with atrophic changes.  Ring pessary removed cleansed and replaced without difficulty.  No evidence of vaginal irritation or erosion.  Cervix with atrophic changes.  Uterus grossly normal midline mobile nontender.  Adnexa without masses or tenderness.  Assessment/Plan:  73 y.o. G1P1 with uterine prolapse using ring pessary.  She had questions about surgery.  We discussed in general was involved with surgery including increased risks secondary to age in the perioperative timeframe.  At this point her and her daughter are comfortable waiting 6 months and then rediscussing the issue if she wants to proceed surgery.  We discussed the urgency.  Options for management to include medication discussed.  Side effect potential medication also reviewed.  Will try Oxytrol OTC to see if this does not help.  Will consider other medications to include Vesicare Enablex Myrbetriq.    Monica Auerbach MD, 12:29 PM 06/29/2019

## 2019-07-01 LAB — URINE CULTURE
MICRO NUMBER:: 1211730
SPECIMEN QUALITY:: ADEQUATE

## 2019-07-01 LAB — URINALYSIS, COMPLETE W/RFL CULTURE
Bacteria, UA: NONE SEEN /HPF
Bilirubin Urine: NEGATIVE
Glucose, UA: NEGATIVE
Ketones, ur: NEGATIVE
Nitrites, Initial: NEGATIVE
RBC / HPF: NONE SEEN /HPF (ref 0–2)
Specific Gravity, Urine: 1.016 (ref 1.001–1.03)
WBC, UA: >=60 /HPF — AB (ref 0–5)
pH: 8 (ref 5.0–8.0)

## 2019-07-01 LAB — CULTURE INDICATED

## 2019-07-24 ENCOUNTER — Other Ambulatory Visit: Payer: Self-pay | Admitting: Family Medicine

## 2019-08-01 ENCOUNTER — Ambulatory Visit: Payer: Medicare Other | Attending: Internal Medicine

## 2019-08-01 DIAGNOSIS — Z23 Encounter for immunization: Secondary | ICD-10-CM | POA: Insufficient documentation

## 2019-08-01 NOTE — Progress Notes (Signed)
   Covid-19 Vaccination Clinic  Name:  Monica Irwin    MRN: [id number] DOB: 02/16/1946  08/01/2019  Monica Irwin was observed post Covid-19 immunization for 15 minutes without incidence. She was provided with Vaccine Information Sheet and instruction to access the V-Safe system.   Monica Irwin was instructed to call 911 with any severe reactions post vaccine: [name] Difficulty breathing  . Swelling of your face and throat  . A fast heartbeat  . A bad rash all over your body  . Dizziness and weakness    Immunizations Administered    Name Date Dose VIS Date Route   Pfizer COVID-19 Vaccine 08/01/2019  2:52 PM 0.3 mL 06/23/2019 Intramuscular   Manufacturer: [place]   Lot: [id number]   [place]: [id number]

## 2019-08-08 ENCOUNTER — Ambulatory Visit: Payer: Medicare Other

## 2019-08-18 ENCOUNTER — Ambulatory Visit: Payer: Medicare Other

## 2019-08-21 ENCOUNTER — Ambulatory Visit: Payer: Medicare Other | Attending: Internal Medicine

## 2019-08-21 DIAGNOSIS — Z23 Encounter for immunization: Secondary | ICD-10-CM

## 2019-08-21 NOTE — Progress Notes (Signed)
   Covid-19 Vaccination Clinic  Name:  Addley Jama    MRN: [id number] DOB: 1946/01/07  08/21/2019  Ms. Clune was observed post Covid-19 immunization for 15 minutes without incidence. She was provided with Vaccine Information Sheet and instruction to access the V-Safe system.   Ms. Harrigan was instructed to call 911 with any severe reactions post vaccine: [name] Difficulty breathing  . Swelling of your face and throat  . A fast heartbeat  . A bad rash all over your body  . Dizziness and weakness    Immunizations Administered    Name Date Dose VIS Date Route   Pfizer COVID-19 Vaccine 08/21/2019  8:56 AM 0.3 mL 06/23/2019 Intramuscular   Manufacturer: [place]   Lot: [id number]   [place]: [id number]

## 2019-09-13 ENCOUNTER — Encounter: Payer: Self-pay | Admitting: Family Medicine

## 2019-09-13 ENCOUNTER — Other Ambulatory Visit: Payer: Self-pay

## 2019-09-13 ENCOUNTER — Ambulatory Visit (INDEPENDENT_AMBULATORY_CARE_PROVIDER_SITE_OTHER): Payer: Medicare Other | Admitting: Family Medicine

## 2019-09-13 VITALS — BP 130/82 | HR 72 | Temp 97.9°F | Ht 63.75 in | Wt 165.8 lb

## 2019-09-13 DIAGNOSIS — M24541 Contracture, right hand: Secondary | ICD-10-CM

## 2019-09-13 DIAGNOSIS — M720 Palmar fascial fibromatosis [Dupuytren]: Secondary | ICD-10-CM

## 2019-09-13 MED ORDER — METHYLPREDNISOLONE ACETATE 40 MG/ML IJ SUSP
20.0000 mg | Freq: Once | INTRAMUSCULAR | Status: AC
  Administered 2019-09-13: 16:00:00 20 mg via INTRA_ARTICULAR

## 2019-09-13 NOTE — Patient Instructions (Signed)
OSTEOARTHRITIS:  Supplements: Tart cherry juice and Curcumin (Turmeric extract) have good scientific evidence  Glucosamine and Chondroitin often helpful - will take about 3 months to see if you have an effect. If you do, great, keep them up, if none at that point, no need to take in the future.

## 2019-09-13 NOTE — Progress Notes (Signed)
Monica Mceachron T. Davelyn Gwinn, MD Primary Care and [place] [facility] [place], [address] Phone: [phone]  FAX: [place] - 74 y.o. female  MRN [id number]  Date of Birth: 1946-02-29  Visit Date: 09/13/2019  PCP: Owens Loffler, MD  Referred by: Owens Loffler, MD  Chief Complaint  Patient presents with  . Hand Pain    Right Middle Finger    This visit occurred during the SARS-CoV-2 public health emergency.  Safety protocols were in place, including screening questions prior to the visit, additional usage of staff PPE, and extensive cleaning of exam room while observing appropriate contact time as indicated for disinfecting solutions.   Subjective:   Chanley Legault is a 74 y.o. very pleasant female patient with Body mass index is 28.67 kg/m. who presents with the following:  R dup 2nd.  She is having pain on the flexor tendon sheath at the R 2nd digit.  There appears to be a very early contracture.  She is starting to have pain with movement.  No trauma or injury.  She does have some significant OA in B hands.  inj 2nd flexor on R  Review of Systems is noted in the HPI, as appropriate   Objective:   BP 130/82   Pulse 72   Temp 97.9 F (36.6 C) (Temporal)   Ht 5' 3.75" (1.619 m)   Wt 165 lb 12 oz (75.2 kg)   SpO2 96%   BMI 28.67 kg/m   GEN: No acute distress; alert,appropriate. PULM: Breathing comfortably in no respiratory distress PSYCH: Normally interactive.    All bony anatomy is NT throughout the whole R hand and wrist.   The 2nd digit on the R has a mild flexion contraction and has pain on the volar aspect.  Loss of motion, mostly at the DIP joint. Unable to make a full composite fist, but very close.  Radiology: No results found.  Assessment and Plan:     ICD-10-CM   1. Dupuytren contracture  M72.0   2. Contracture of finger joint, right  M24.541  methylPREDNISolone acetate (DEPO-MEDROL) injection 20 mg   She has an early Dupuytren's contracture on the R 2nd digit.  Reviewed the anatomy with the patient and her daughter.    Patient Instructions  OSTEOARTHRITIS:  Supplements: Tart cherry juice and Curcumin (Turmeric extract) have good scientific evidence  Glucosamine and Chondroitin often helpful - will take about 3 months to see if you have an effect. If you do, great, keep them up, if none at that point, no need to take in the future.      Tendon Sheath Injection Procedure Note Diamantina Melillo [birth year] Date of procedure: 09/13/2019  Procedure: Tendon Sheath Injection for Dupuytren's contracture, R 2nd Indications: Pain  Procedure Details Verbal consent was obtained. Risks (including rare risk of infection, potential risk for skin lightening and potential atrophy), benefits and alternatives were discussed. Prepped with Chloraprep and Ethyl Chloride used for anesthesia. Under sterile conditions, patient injected at palmar crease aiming distally with 45 degree angle; injected directly into tendon sheath. Medication flowed freely without resistance.  Needle size: 22 gauge 1 1/2 inch Injection: 1/2 cc of Lidocaine 1% and Depo-Medrol 20 mg Medication: Depo-Medrol 20 mg   Follow-up: No follow-ups on file.  Meds ordered this encounter  Medications  . methylPREDNISolone acetate (DEPO-MEDROL) injection 20 mg   There are no discontinued medications. No orders of the defined  types were placed in this encounter.   Signed,  Maud Deed. Rhyse Skowron, MD   Outpatient Encounter Medications as of 09/13/2019  Medication Sig  . donepezil (ARICEPT) 10 MG tablet Take one tab at bedtime.  . fluocinonide ointment (LIDEX) [id number] % Apply 1 application topically 2 (two) times daily.  [name] ibuprofen (ADVIL,MOTRIN) 200 MG tablet Take 400 mg by mouth daily as needed for moderate pain.  [name] levothyroxine (SYNTHROID) 88 MCG tablet TAKE 1 TABLET BY  MOUTH EVERY MORNING 30 MINUTES BEFORE BREAKFAST.  . memantine (NAMENDA) 10 MG tablet TAKE 1 TABLET (10 MG TOTAL) BY MOUTH 2 (TWO) TIMES DAILY.  . rosuvastatin (CRESTOR) 10 MG tablet TAKE 1 TABLET BY MOUTH DAILY.  . [EXPIRED] methylPREDNISolone acetate (DEPO-MEDROL) injection 20 mg    No facility-administered encounter medications on file as of 09/13/2019.

## 2019-11-10 ENCOUNTER — Other Ambulatory Visit: Payer: Self-pay | Admitting: Orthopedic Surgery

## 2019-11-10 DIAGNOSIS — M25561 Pain in right knee: Secondary | ICD-10-CM

## 2019-12-01 ENCOUNTER — Other Ambulatory Visit: Payer: Self-pay

## 2019-12-04 ENCOUNTER — Ambulatory Visit (INDEPENDENT_AMBULATORY_CARE_PROVIDER_SITE_OTHER): Payer: Medicare Other | Admitting: Obstetrics and Gynecology

## 2019-12-04 ENCOUNTER — Other Ambulatory Visit: Payer: Self-pay

## 2019-12-04 ENCOUNTER — Encounter: Payer: Self-pay | Admitting: Obstetrics and Gynecology

## 2019-12-04 VITALS — BP 118/76

## 2019-12-04 DIAGNOSIS — Z13820 Encounter for screening for osteoporosis: Secondary | ICD-10-CM | POA: Diagnosis not present

## 2019-12-04 DIAGNOSIS — R350 Frequency of micturition: Secondary | ICD-10-CM

## 2019-12-04 DIAGNOSIS — N814 Uterovaginal prolapse, unspecified: Secondary | ICD-10-CM | POA: Diagnosis not present

## 2019-12-04 DIAGNOSIS — Z4689 Encounter for fitting and adjustment of other specified devices: Secondary | ICD-10-CM | POA: Diagnosis not present

## 2019-12-04 NOTE — Progress Notes (Signed)
   Monica Irwin [birth year] [id number]  SUBJECTIVE:  74 y.o. G1P1 female presents for a pessary check.  Her daughter accompanies her to the appointment she requires a wheelchair right now due to healing from some stress fractures in her right knee.  DEXA scan is ordered per her treating provider's request.  She denies any vaginal bleeding.  She has been having a little bit of increased urinary frequency, up to 10 times per day.  No vaginal pain or discomfort.  Current Outpatient Medications  Medication Sig Dispense Refill  . Calcium Carbonate-Vitamin D (CALTRATE 600+D PO) Take by mouth.    . donepezil (ARICEPT) 10 MG tablet Take one tab at bedtime. 90 tablet 3  . fluocinonide ointment (LIDEX) [id number] % Apply 1 application topically 2 (two) times daily. 60 g 1  . ibuprofen (ADVIL,MOTRIN) 200 MG tablet Take 400 mg by mouth daily as needed for moderate pain.    [name] levothyroxine (SYNTHROID) 88 MCG tablet TAKE 1 TABLET BY MOUTH EVERY MORNING 30 MINUTES BEFORE BREAKFAST. 90 tablet 3  . memantine (NAMENDA) 10 MG tablet TAKE 1 TABLET (10 MG TOTAL) BY MOUTH 2 (TWO) TIMES DAILY. 180 tablet 4  . rosuvastatin (CRESTOR) 10 MG tablet TAKE 1 TABLET BY MOUTH DAILY. 90 tablet 1   No current facility-administered medications for this visit.   Allergies: Patient has no known allergies.  No LMP recorded. Patient is postmenopausal.  Past medical history,surgical history, problem list, medications, allergies, family history and social history were all reviewed and documented as reviewed in the EPIC chart.   OBJECTIVE:  BP 118/76  The patient appears well, alert, oriented x 3, in no distress. PELVIC EXAM: Ring pessary removed, cleansed, replaced without difficulty.  VULVA: Atrophic changes, normal appearing vulva with no masses, tenderness or lesions, VAGINA: normal appearing vagina with normal color and discharge, no lesions, no irritation or erosion, CERVIX: normal appearing cervix without discharge or  lesions  Chaperone: [name] present during the examination  ASSESSMENT:  74 y.o. G1P1 here for pessary maintenance, urinary frequency  PLAN:  Using ring pessary with good effect, will continue.  With the urinary frequency will check a urinalysis and let her know of the results when available.  Return for repeat pessary care in 4 to 6 months.   Joseph Pierini MD 12/04/19

## 2019-12-06 LAB — URINALYSIS, COMPLETE W/RFL CULTURE
Bacteria, UA: NONE SEEN /HPF
Bilirubin Urine: NEGATIVE
Glucose, UA: NEGATIVE
Ketones, ur: NEGATIVE
Nitrites, Initial: NEGATIVE
RBC / HPF: NONE SEEN /HPF (ref 0–2)
Specific Gravity, Urine: 1.012 (ref 1.001–1.03)
Squamous Epithelial / HPF: NONE SEEN /HPF (ref <=5)
pH: >=8.5 — AB (ref 5.0–8.0)

## 2019-12-06 LAB — URINE CULTURE
MICRO NUMBER:: 10515249
SPECIMEN QUALITY:: ADEQUATE

## 2019-12-06 LAB — CULTURE INDICATED

## 2019-12-25 ENCOUNTER — Other Ambulatory Visit: Payer: Self-pay

## 2019-12-26 ENCOUNTER — Ambulatory Visit (INDEPENDENT_AMBULATORY_CARE_PROVIDER_SITE_OTHER): Payer: Medicare Other

## 2019-12-26 ENCOUNTER — Other Ambulatory Visit: Payer: Self-pay | Admitting: Obstetrics and Gynecology

## 2019-12-26 DIAGNOSIS — M810 Age-related osteoporosis without current pathological fracture: Secondary | ICD-10-CM | POA: Diagnosis not present

## 2019-12-26 DIAGNOSIS — Z780 Asymptomatic menopausal state: Secondary | ICD-10-CM | POA: Diagnosis not present

## 2019-12-26 DIAGNOSIS — Z13820 Encounter for screening for osteoporosis: Secondary | ICD-10-CM

## 2020-02-06 ENCOUNTER — Telehealth: Payer: Self-pay | Admitting: Family Medicine

## 2020-02-06 NOTE — Telephone Encounter (Signed)
Please schedule MWV with nurse and CPE with Dr. Lorelei Pont for sometime in August.

## 2020-02-07 NOTE — Telephone Encounter (Signed)
Left message asking pt to call office  °

## 2020-02-15 NOTE — Telephone Encounter (Signed)
Patients daughter called and got patient scheduled for CPE,AWV and lab.

## 2020-02-15 NOTE — Telephone Encounter (Signed)
Left message asking pt to call office  °

## 2020-02-16 ENCOUNTER — Other Ambulatory Visit: Payer: Self-pay | Admitting: Family Medicine

## 2020-02-28 ENCOUNTER — Other Ambulatory Visit (INDEPENDENT_AMBULATORY_CARE_PROVIDER_SITE_OTHER): Payer: Medicare Other

## 2020-02-28 ENCOUNTER — Other Ambulatory Visit: Payer: Self-pay

## 2020-02-28 DIAGNOSIS — E785 Hyperlipidemia, unspecified: Secondary | ICD-10-CM

## 2020-02-28 DIAGNOSIS — Z79899 Other long term (current) drug therapy: Secondary | ICD-10-CM | POA: Diagnosis not present

## 2020-02-28 DIAGNOSIS — E559 Vitamin D deficiency, unspecified: Secondary | ICD-10-CM | POA: Diagnosis not present

## 2020-02-28 DIAGNOSIS — E039 Hypothyroidism, unspecified: Secondary | ICD-10-CM

## 2020-02-29 ENCOUNTER — Other Ambulatory Visit: Payer: Medicare Other

## 2020-02-29 LAB — HEPATIC FUNCTION PANEL
ALT: 22 U/L (ref 0–35)
AST: 18 U/L (ref 0–37)
Albumin: 4.2 g/dL (ref 3.5–5.2)
Alkaline Phosphatase: 76 U/L (ref 39–117)
Bilirubin, Direct: 0.1 mg/dL (ref 0.0–0.3)
Total Bilirubin: 0.5 mg/dL (ref 0.2–1.2)
Total Protein: 6.5 g/dL (ref 6.0–8.3)

## 2020-02-29 LAB — BASIC METABOLIC PANEL
BUN: 23 mg/dL (ref 6–23)
CO2: 30 mEq/L (ref 19–32)
Calcium: 9.5 mg/dL (ref 8.4–10.5)
Chloride: 104 mEq/L (ref 96–112)
Creatinine, Ser: 1.03 mg/dL (ref 0.40–1.20)
GFR: 52.06 mL/min — ABNORMAL LOW (ref >60.00)
Glucose, Bld: 112 mg/dL — ABNORMAL HIGH (ref 70–99)
Potassium: 4.9 mEq/L (ref 3.5–5.1)
Sodium: 142 mEq/L (ref 135–145)

## 2020-02-29 LAB — CBC WITH DIFFERENTIAL/PLATELET
Basophils Absolute: 0.1 10*3/uL (ref 0.0–0.1)
Basophils Relative: 1 % (ref 0.0–3.0)
Eosinophils Absolute: 0.1 10*3/uL (ref 0.0–0.7)
Eosinophils Relative: 1.2 % (ref 0.0–5.0)
HCT: 45.5 % (ref 36.0–46.0)
Hemoglobin: 15.3 g/dL — ABNORMAL HIGH (ref 12.0–15.0)
Lymphocytes Relative: 13.2 % (ref 12.0–46.0)
Lymphs Abs: 0.8 10*3/uL (ref 0.7–4.0)
MCHC: 33.6 g/dL (ref 30.0–36.0)
MCV: 95.3 fl (ref 78.0–100.0)
Monocytes Absolute: 0.5 10*3/uL (ref 0.1–1.0)
Monocytes Relative: 8.7 % (ref 3.0–12.0)
Neutro Abs: 4.7 10*3/uL (ref 1.4–7.7)
Neutrophils Relative %: 75.9 % (ref 43.0–77.0)
Platelets: 228 10*3/uL (ref 150.0–400.0)
RBC: 4.77 Mil/uL (ref 3.87–5.11)
RDW: 13.1 % (ref 11.5–15.5)
WBC: 6.2 10*3/uL (ref 4.0–10.5)

## 2020-02-29 LAB — LIPID PANEL
Cholesterol: 152 mg/dL (ref 0–200)
HDL: 51 mg/dL (ref >39.00)
LDL Cholesterol: 74 mg/dL (ref 0–99)
NonHDL: 101.16
Total CHOL/HDL Ratio: 3
Triglycerides: 137 mg/dL (ref 0.0–149.0)
VLDL: 27.4 mg/dL (ref 0.0–40.0)

## 2020-02-29 LAB — TSH: TSH: 3.71 u[IU]/mL (ref 0.35–4.50)

## 2020-02-29 LAB — T4, FREE: Free T4: 1.54 ng/dL (ref 0.60–1.60)

## 2020-02-29 LAB — T3, FREE: T3, Free: 3.1 pg/mL (ref 2.3–4.2)

## 2020-02-29 LAB — VITAMIN D 25 HYDROXY (VIT D DEFICIENCY, FRACTURES): VITD: 32.55 ng/mL (ref 30.00–100.00)

## 2020-03-04 ENCOUNTER — Ambulatory Visit: Payer: Medicare Other

## 2020-03-07 ENCOUNTER — Encounter: Payer: Self-pay | Admitting: Family Medicine

## 2020-03-07 ENCOUNTER — Other Ambulatory Visit: Payer: Self-pay

## 2020-03-07 ENCOUNTER — Ambulatory Visit (INDEPENDENT_AMBULATORY_CARE_PROVIDER_SITE_OTHER): Payer: Medicare Other | Admitting: Family Medicine

## 2020-03-07 VITALS — BP 118/80 | HR 79 | Temp 97.4°F | Ht 63.5 in | Wt 156.2 lb

## 2020-03-07 DIAGNOSIS — Z0000 Encounter for general adult medical examination without abnormal findings: Secondary | ICD-10-CM

## 2020-03-07 MED ORDER — ALENDRONATE SODIUM 70 MG PO TABS: 70.0000 mg | ORAL_TABLET | ORAL | 3 refills | Status: DC

## 2020-03-07 NOTE — Progress Notes (Signed)
Kimetha Trulson T. Byford Schools, MD, [place] at [facility] [place], [address]  Phone: [phone]  FAX: [place] - 74 y.o. female  MRN [id number]  Date of Birth: August 29, 1945  Date: 03/07/2020  PCP: Owens Loffler, MD  Referral: Owens Loffler, MD  Chief Complaint  Patient presents with  . Medicare Wellness    This visit occurred during the SARS-CoV-2 public health emergency.  Safety protocols were in place, including screening questions prior to the visit, additional usage of staff PPE, and extensive cleaning of exam room while observing appropriate contact time as indicated for disinfecting solutions.   Patient Care Team: Owens Loffler, MD as PCP - General Subjective:   Nereida Schepp is a 75 y.o. pleasant patient who presents for a medicare wellness examination:  Health Maintenance Summary Reviewed and updated, unless pt declines services.  Tobacco History Reviewed. Non-smoker Alcohol: No concerns, no excessive use Exercise Habits: Some activity, rec at least 30 mins 5 times a week - as able now STD concerns: none Drug Use: None Lumps or breast concerns: no Breast Cancer Family History: no  She continues to have some ongoing cognitive impairment, and she is followed by Neurology.  Does have 2 stress fx in April.    DEXA - bisphosfenates? fosmamax  Health Maintenance  Topic Date Due  . INFLUENZA VACCINE  02/11/2020  . COLONOSCOPY  10/29/2020  . TETANUS/TDAP  01/21/2027  . DEXA SCAN  Completed  . COVID-19 Vaccine  Completed  . Hepatitis C Screening  Completed  . PNA vac Low Risk Adult  Completed   Immunization History  Administered Date(s) Administered  . Fluad Quad(high Dose 65+) 04/06/2019  . Influenza Whole 04/10/2009, 03/13/2010  . Influenza, High Dose Seasonal PF 04/01/2018  . Influenza,inj,Quad PF,6+ Mos 04/22/2015,  03/30/2016, 05/24/2017  . Influenza-Unspecified 04/12/2013, 04/12/2017  . PFIZER SARS-COV-2 Vaccination 08/01/2019, 08/21/2019  . Pneumococcal Conjugate-13 11/08/2013  . Pneumococcal Polysaccharide-23 05/22/2010  . Td 07/13/2001  . Tdap 01/20/2017  . Zoster 07/13/2001  . Zoster Recombinat (Shingrix) 02/15/2019, 02/15/2019, 04/24/2019, 04/24/2019    Patient Active Problem List   Diagnosis Date Noted  . Primary osteoarthritis of right hip 08/19/2017  . Mild cognitive impairment 10/19/2016  . History of colonic polyps 10/15/2015  . AVNRT (AV nodal re-entry tachycardia) ([place]) 08/19/2015  . Aortic calcification ([place]) 04/22/2015  . Major depressive disorder, recurrent episode, in full remission ([place]) 07/24/2010  . OSTEOPOROSIS 07/24/2010  . INSOMNIA 08/29/2009  . Hyperlipidemia with target LDL less than 100 12/19/2008  . Hypothyroidism 07/02/2008  . OSTEOARTHRITIS 07/02/2008  . NEPHROLITHIASIS, HX OF 07/02/2008    Past Medical History:  Diagnosis Date  . ASCUS of cervix with negative high risk HPV 05/2018   Repeat Pap smear one year interval  . AVNRT (AV nodal re-entry tachycardia) ([place]) 08/19/2015   S/p successful ablation 07/2015.  [name] Dysrhythmia    hx of SVT - 2017   . History of colonic polyps   . History of kidney stones   . Hypothyroidism   . Liver cyst   . Memory loss   . Nephrolithiasis   . Osteoarthritis   . Osteoporosis   . SVT (supraventricular tachycardia) ([place]) 05/07/2015    Past Surgical History:  Procedure Laterality Date  . ABLATION    . BACK SURGERY     [phone]  . ELECTROPHYSIOLOGIC STUDY N/A 07/22/2015   Procedure: SVT Ablation;  Surgeon: Will  Martin Camnitz, MD;  Location: [facility] INVASIVE [facility];  Service: Cardiovascular;  Laterality: N/A;  . MM BREAST STEREO BIOPSY LEFT ([facility] HX)    . PARTIAL COLECTOMY  2005  . SMALL INTESTINE SURGERY    . THYROIDECTOMY  2008  . TOTAL HIP ARTHROPLASTY Right 08/19/2017   Procedure: RIGHT TOTAL HIP ARTHROPLASTY  ANTERIOR APPROACH;  Surgeon: Swinteck, Brian, MD;  Location: [facility] ORS;  Service: Orthopedics;  Laterality: Right;    Family History  Problem Relation Age of Onset  . Stroke Mother   . Alzheimer's disease Mother   . Heart failure Brother   . Alzheimer's disease Sister   . Other Father        natural causes  . Heart attack Brother   . Colon cancer Other        nephew  . Ulcerative colitis Sister        colon removed    Past Medical History, Surgical History, Social History, Family History, Problem List, Medications, and Allergies have been reviewed and updated if relevant.  Review of Systems: Pertinent positives are listed above.  Otherwise, a full 14 point review of systems has been done in full and it is negative except where it is noted positive.  Objective:   BP 118/80   Pulse 79   Temp (!) 97.4 F (36.3 C) (Temporal)   Ht 5' 3.5" (1.613 m)   Wt 156 lb 4 oz (70.9 kg)   SpO2 95%   BMI 27.24 kg/m  Fall Risk  03/07/2020 02/13/2019 05/24/2017 03/30/2016 02/13/2015  Falls in the past year? 0 0 No No No   Ideal Body Weight: Weight in (lb) to have BMI = 25: 143.1  Hearing Screening   Method: Audiometry   125Hz 250Hz 500Hz 1000Hz 2000Hz 3000Hz 4000Hz 6000Hz 8000Hz  Right ear:   25 25 25  0    Left ear:   20 40 40  0      Visual Acuity Screening   Right eye Left eye Both eyes  Without correction: 20/25 20/25 20/15  With correction:      Depression screen PHQ 2/9 03/07/2020 02/13/2019 05/24/2017 03/30/2016 02/13/2015  Decreased Interest 0 0 0 0 0  Down, Depressed, Hopeless 0 0 0 0 0  PHQ - 2 Score 0 0 0 0 0  Altered sleeping - - 0 - -  Tired, decreased energy - - 0 - -  Change in appetite - - 0 - -  Feeling bad or failure about yourself  - - 0 - -  Trouble concentrating - - 0 - -  Moving slowly or fidgety/restless - - 0 - -  Suicidal thoughts - - 0 - -  PHQ-9 Score - - 0 - -  Difficult doing work/chores - - Not difficult at all - -     GEN: well developed, well nourished, no  acute distress Eyes: conjunctiva and lids normal, PERRLA, EOMI ENT: TM clear, nares clear, oral exam WNL Neck: supple, no lymphadenopathy, no thyromegaly, no JVD Pulm: clear to auscultation and percussion, respiratory effort normal CV: regular rate and rhythm, S1-S2, no murmur, rub or gallop, no bruits Chest: no scars, masses, no lumps BREAST: breast exam declined GI: soft, non-tender; no hepatosplenomegaly, masses; active bowel sounds all quadrants GU: GU exam declined Lymph: no cervical, axillary or inguinal adenopathy MSK: gait normal, muscle tone and strength WNL, no joint swelling, effusions, discoloration, crepitus  SKIN: clear, good turgor, color WNL, no rashes, lesions, or ulcerations Neuro: normal   mental status, normal strength, sensation, and motion Psych: alert; oriented to person, place and time, normally interactive and not anxious or depressed in appearance.   All labs reviewed with patient.  Lipids: Lab Results  Component Value Date   CHOL 152 02/28/2020   Lab Results  Component Value Date   HDL 51.00 02/28/2020   Lab Results  Component Value Date   LDLCALC 74 02/28/2020   Lab Results  Component Value Date   TRIG 137.0 02/28/2020   Lab Results  Component Value Date   CHOLHDL 3 02/28/2020   CBC: CBC Latest Ref Rng & Units 02/28/2020 02/09/2019 08/21/2017  WBC 4.0 - 10.5 K/uL 6.2 5.3 9.7  Hemoglobin 12.0 - 15.0 g/dL 15.3(H) 15.4(H) 12.2  Hematocrit 36 - 46 % 45.5 46.9(H) 38.2  Platelets 150 - 400 K/uL 228.0 242.0 187    Basic Metabolic Panel:    Component Value Date/Time   NA 142 02/28/2020 1607   NA 144 10/19/2016 1308   K 4.9 02/28/2020 1607   CL 104 02/28/2020 1607   CO2 30 02/28/2020 1607   BUN 23 02/28/2020 1607   BUN 22 10/19/2016 1308   CREATININE 1.03 02/28/2020 1607   CREATININE 0.79 07/16/2015 1228   GLUCOSE 112 (H) 02/28/2020 1607   CALCIUM 9.5 02/28/2020 1607   Hepatic Function Latest Ref Rng & Units 02/28/2020 02/09/2019 05/24/2017    Total Protein 6.0 - 8.3 g/dL 6.5 6.4 7.0  Albumin 3.5 - 5.2 g/dL 4.2 4.2 4.1  AST 0 - 37 U/L 18 18 20  ALT 0 - 35 U/L 22 16 24  Alk Phosphatase 39 - 117 U/L 76 74 75  Total Bilirubin 0.2 - 1.2 mg/dL 0.5 0.5 0.5  Bilirubin, Direct 0.0 - 0.3 mg/dL 0.1 0.1 0.0    Lab Results  Component Value Date   HGBA1C 6.2 02/09/2019   Lab Results  Component Value Date   TSH 3.71 02/28/2020    No results found.  Assessment and Plan:     ICD-10-CM   1. Healthcare maintenance  Z00.00    Bone density - 3.2 T score Start fosamax weekly  Health Maintenance Exam: The patient's preventative maintenance and recommended screening tests for an annual wellness exam were reviewed in full today. Brought up to date unless services declined.  Counselled on the importance of diet, exercise, and its role in overall health and mortality. The patient's FH and SH was reviewed, including their home life, tobacco status, and drug and alcohol status.  Follow-up in 1 year for physical exam or additional follow-up below.  I have personally reviewed the Medicare Annual Wellness questionnaire and have noted 1. The patient's medical and social history 2. Their use of alcohol, tobacco or illicit drugs 3. Their current medications and supplements 4. The patient's functional ability including ADL's, fall risks, home safety risks and hearing or visual             impairment. 5. Diet and physical activities 6. Evidence for depression or mood disorders 7. Reviewed Updated provider list, see scanned forms and [facility] Snapshot.  8. Reviewed whether or not the patient has HCPOA or living will, and discussed what this means with the patient.  Recommended she bring in a copy for his chart in [facility].  The patients weight, height, BMI and visual acuity have been recorded in the chart I have made referrals, counseling and provided education to the patient based review of the above and I have provided the pt with a written    personalized care plan for preventive services.  I have provided the patient with a copy of your personalized plan for preventive services. Instructed to take the time to review along with their updated medication list.  Follow-up: No follow-ups on file. Or follow-up in 1 year unless noted.  Future Appointments  Date Time Provider Department Center  06/10/2020 10:15 AM Slack, Sarah J, NP [facility] None    Meds ordered this encounter  Medications  . alendronate (FOSAMAX) 70 MG tablet    Sig: Take 1 tablet (70 mg total) by mouth every 7 (seven) days. Take with a full glass of water on an empty stomach.    Dispense:  12 tablet    Refill:  3   There are no discontinued medications. No orders of the defined types were placed in this encounter.   Signed,   T. , MD   Allergies as of 03/07/2020   No Known Allergies     Medication List       Accurate as of March 07, 2020 10:42 AM. If you have any questions, ask your nurse or doctor.        alendronate 70 MG tablet Commonly known as: FOSAMAX Take 1 tablet (70 mg total) by mouth every 7 (seven) days. Take with a full glass of water on an empty stomach. Started by:  , MD   CALTRATE 600+D PO Take by mouth.   donepezil 10 MG tablet Commonly known as: ARICEPT Take one tab at bedtime.   fluocinonide ointment 0.05 % Commonly known as: LIDEX Apply 1 application topically 2 (two) times daily.   ibuprofen 200 MG tablet Commonly known as: ADVIL Take 400 mg by mouth daily as needed for moderate pain.   levothyroxine 88 MCG tablet Commonly known as: SYNTHROID TAKE 1 TABLET BY MOUTH EVERY MORNING 30 MINUTES BEFORE BREAKFAST.   memantine 10 MG tablet Commonly known as: NAMENDA TAKE 1 TABLET (10 MG TOTAL) BY MOUTH 2 (TWO) TIMES DAILY.   rosuvastatin 10 MG tablet Commonly known as: CRESTOR TAKE 1 TABLET BY MOUTH DAILY.      

## 2020-03-07 NOTE — Patient Instructions (Signed)
Recommend optimizing calcium (1200 mg/day) and vitamin D (2,000 IU/day).

## 2020-03-08 ENCOUNTER — Ambulatory Visit: Payer: Medicare Other

## 2020-03-13 ENCOUNTER — Encounter: Payer: Self-pay | Admitting: Obstetrics and Gynecology

## 2020-03-13 ENCOUNTER — Ambulatory Visit (INDEPENDENT_AMBULATORY_CARE_PROVIDER_SITE_OTHER): Payer: Medicare Other | Admitting: Obstetrics and Gynecology

## 2020-03-13 ENCOUNTER — Other Ambulatory Visit: Payer: Self-pay

## 2020-03-13 VITALS — BP 122/82

## 2020-03-13 DIAGNOSIS — N814 Uterovaginal prolapse, unspecified: Secondary | ICD-10-CM | POA: Diagnosis not present

## 2020-03-13 DIAGNOSIS — N8189 Other female genital prolapse: Secondary | ICD-10-CM | POA: Diagnosis not present

## 2020-03-13 DIAGNOSIS — Z4689 Encounter for fitting and adjustment of other specified devices: Secondary | ICD-10-CM

## 2020-03-13 NOTE — Progress Notes (Signed)
   Rylynn Kobs Feb 01, 1946 [id number]  SUBJECTIVE:  74 y.o. G1P1 female presents for a pessary check.  She has had increased urinary urgency but no frequency or dysuria.  No vaginal pain, discomfort, bleeding, or discharge.  Allergies: Patient has no known allergies.  No LMP recorded. Patient is postmenopausal.  Past medical history,surgical history, problem list, medications, allergies, family history and social history were all reviewed and documented as reviewed in the EPIC chart.   OBJECTIVE:  BP 122/82  The patient appears well, alert, oriented x 3, in no distress. PELVIC EXAM: Ring pessary removed, cleansed, replaced without difficulty.  VULVA: Atrophic changes, normal appearing vulva with no masses, tenderness or lesions, VAGINA: normal appearing vagina with normal color and discharge, no lesions, no irritation or erosion, CERVIX: normal appearing cervix without discharge or lesions  Chaperone: [name] present during the examination  ASSESSMENT:  74 y.o. G1P1 here for pessary maintenance  PLAN:  Using ring pessary with good results.  We will continue.  For the urinary urgency recommended timed voids every 1-2 hours.  She does limit caffeine.  Return for repeat pessary care in 3 to 4 months.  Joseph Pierini MD 03/13/20

## 2020-04-25 ENCOUNTER — Other Ambulatory Visit: Payer: Self-pay | Admitting: Family Medicine

## 2020-04-25 DIAGNOSIS — Z1231 Encounter for screening mammogram for malignant neoplasm of breast: Secondary | ICD-10-CM

## 2020-05-08 ENCOUNTER — Other Ambulatory Visit: Payer: Self-pay | Admitting: Family Medicine

## 2020-05-31 ENCOUNTER — Other Ambulatory Visit: Payer: Self-pay | Admitting: Neurology

## 2020-06-10 ENCOUNTER — Ambulatory Visit (INDEPENDENT_AMBULATORY_CARE_PROVIDER_SITE_OTHER): Payer: Medicare Other | Admitting: Neurology

## 2020-06-10 ENCOUNTER — Ambulatory Visit (INDEPENDENT_AMBULATORY_CARE_PROVIDER_SITE_OTHER): Payer: Medicare Other | Admitting: Obstetrics and Gynecology

## 2020-06-10 ENCOUNTER — Ambulatory Visit: Payer: Medicare Other | Admitting: Obstetrics and Gynecology

## 2020-06-10 ENCOUNTER — Other Ambulatory Visit: Payer: Self-pay

## 2020-06-10 ENCOUNTER — Encounter: Payer: Self-pay | Admitting: Neurology

## 2020-06-10 ENCOUNTER — Encounter: Payer: Self-pay | Admitting: Obstetrics and Gynecology

## 2020-06-10 VITALS — BP 130/70

## 2020-06-10 DIAGNOSIS — R350 Frequency of micturition: Secondary | ICD-10-CM | POA: Diagnosis not present

## 2020-06-10 DIAGNOSIS — N8189 Other female genital prolapse: Secondary | ICD-10-CM

## 2020-06-10 DIAGNOSIS — F0390 Unspecified dementia without behavioral disturbance: Secondary | ICD-10-CM | POA: Diagnosis not present

## 2020-06-10 DIAGNOSIS — N814 Uterovaginal prolapse, unspecified: Secondary | ICD-10-CM | POA: Diagnosis not present

## 2020-06-10 DIAGNOSIS — Z4689 Encounter for fitting and adjustment of other specified devices: Secondary | ICD-10-CM

## 2020-06-10 HISTORY — DX: Unspecified dementia, unspecified severity, without behavioral disturbance, psychotic disturbance, mood disturbance, and anxiety: F03.90

## 2020-06-10 MED ORDER — SULFAMETHOXAZOLE-TRIMETHOPRIM 800-160 MG PO TABS: 1.0000 | ORAL_TABLET | Freq: Two times a day (BID) | ORAL | 0 refills | Status: AC

## 2020-06-10 MED ORDER — DONEPEZIL HCL 10 MG PO TABS: ORAL_TABLET | ORAL | 4 refills | Status: DC

## 2020-06-10 NOTE — Patient Instructions (Signed)
Continue current medications Encourage exercise  See you back in 1 year

## 2020-06-10 NOTE — Progress Notes (Signed)
   Monica Irwin [birth year] [id number]  SUBJECTIVE:  74 y.o. G1P1 female presents for a pessary check.  Presents with her daughter.  She has had increased urinary urgency and nocturia, not really happening during the daytime, no specific dysuria symptoms.  Notes that she will sometimes note sensation to void while in bed but by the time she gets up she has a urinary accident.  This does not happen every night but a few nights per week.  No vaginal pain, discomfort, bleeding, or discharge.   Allergies: Patient has no known allergies.  No LMP recorded. Patient is postmenopausal.  Past medical history,surgical history, problem list, medications, allergies, family history and social history were all reviewed and documented as reviewed in the EPIC chart.   OBJECTIVE:  BP 130/70  The patient appears well, alert, oriented x 3, in no distress. PELVIC EXAM: Ring pessary removed, cleansed, replaced without difficulty.  VULVA: Atrophic changes, normal appearing vulva with no masses, tenderness or lesions, VAGINA: normal appearing vagina with normal color and discharge, no lesions, no irritation or erosion, CERVIX: normal appearing cervix without discharge or lesions Pessary is removed, cleansed, and reinserted without difficulty Urinalysis 20-40 WBC, 10-20 RBC, 0-5 squamous epithelial cells, few bacteria, negative nitrite, 2+ leukocyte esterase, clear Chaperone: Thurnell Garbe present during the examination  ASSESSMENT:  74 y.o. G1P1 here for pessary maintenance, possible cute cystitis  PLAN:  Using ring pessary with good results.  We will continue.   UA indicates possible acute cystitis.  Given her symptoms including the increased sensation of urinary urgency and frequency/nocturia, will empirically treat with Bactrim DS twice daily for 5 days.  We will send urine for culture and if no infection will have her stop antibiotic at that point.  Recommend using absorptive pads or briefs overnight  and frequent changes to avoid skin moisture irritation. Return for repeat pessary care in 3 to 4 months.  Joseph Pierini MD 06/10/20
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
## 2020-06-14 LAB — URINALYSIS, COMPLETE W/RFL CULTURE
Bilirubin Urine: NEGATIVE
Glucose, UA: NEGATIVE
Hyaline Cast: NONE SEEN /LPF
Ketones, ur: NEGATIVE
Nitrites, Initial: NEGATIVE
Specific Gravity, Urine: 1.012 (ref 1.001–1.03)
pH: 8.5 — ABNORMAL HIGH (ref 5.0–8.0)

## 2020-06-14 LAB — URINE CULTURE
MICRO NUMBER:: 11251714
SPECIMEN QUALITY:: ADEQUATE

## 2020-06-14 LAB — CULTURE INDICATED

## 2020-06-24 ENCOUNTER — Other Ambulatory Visit: Payer: Self-pay

## 2020-06-24 ENCOUNTER — Ambulatory Visit
Admission: RE | Admit: 2020-06-24 | Discharge: 2020-06-24 | Disposition: A | Discharge status: Home / Self Care | Payer: Medicare Other | Source: Ambulatory Visit | Attending: Family Medicine | Admitting: Family Medicine

## 2020-06-24 DIAGNOSIS — Z1231 Encounter for screening mammogram for malignant neoplasm of breast: Secondary | ICD-10-CM

## 2020-07-02 ENCOUNTER — Ambulatory Visit: Payer: Medicare Other | Admitting: Obstetrics and Gynecology

## 2020-07-09 ENCOUNTER — Other Ambulatory Visit: Payer: Self-pay | Admitting: Family Medicine

## 2020-08-15 NOTE — Progress Notes (Signed)
I have reviewed and agreed above plan. 

## 2020-10-21 NOTE — Progress Notes (Signed)
GYNECOLOGY  VISIT   HPI: 75 y.o.   Divorced  Caucasian  female   G1P1 with No LMP recorded. Patient is postmenopausal.   here for pessary check.  Using a pessary for  3 - 4 years.  No discharge, bleeding or pain.   Not using any vaginal estrogen cream.    Some urinary leakage with urgency occasionally. No increase in symptoms.  Denies dysuria and hematuria.   Can leak with a cough or sneeze and her bladder is full.  Wears a pad when she leaves the house.   Patient had an E Coli and Proteus bladder infection.   Denies constipation or diarrhea.  Denies fecal incontinence.  GYNECOLOGIC HISTORY: No LMP recorded. Patient is postmenopausal. Contraception:  PMP Menopausal hormone therapy:  None Last mammogram: 06-24-20 3D/Neg/BiRads1 Last pap smear:   05-16-18 ASCUS        OB History    Gravida  1   Para  1   Term      Preterm      AB      Living  1     SAB      IAB      Ectopic      Multiple      Live Births                 Patient Active Problem List   Diagnosis Date Noted  . Dementia ([facility]) 06/10/2020  . Primary osteoarthritis of right hip 08/19/2017  . Mild cognitive impairment 10/19/2016  . History of colonic polyps 10/15/2015  . AVNRT (AV nodal re-entry tachycardia) ([place]) 08/19/2015  . Aortic calcification ([facility]) 04/22/2015  . Major depressive disorder, recurrent episode, in full remission ([facility]) 07/24/2010  . OSTEOPOROSIS 07/24/2010  . INSOMNIA 08/29/2009  . Hyperlipidemia with target LDL less than 100 12/19/2008  . Hypothyroidism 07/02/2008  . OSTEOARTHRITIS 07/02/2008  . NEPHROLITHIASIS, HX OF 07/02/2008    Past Medical History:  Diagnosis Date  . ASCUS of cervix with negative high risk HPV 05/2018   Repeat Pap smear one year interval  . AVNRT (AV nodal re-entry tachycardia) ([facility]) 08/19/2015   S/p successful ablation 07/2015.  [name] Dysrhythmia    hx of SVT - 2017   . History of colonic polyps   . History of kidney stones   .  Hypothyroidism   . Liver cyst   . Memory loss   . Nephrolithiasis   . Osteoarthritis   . Osteoporosis   . SVT (supraventricular tachycardia) ([facility]) 05/07/2015    Past Surgical History:  Procedure Laterality Date  . ABLATION    . BACK SURGERY     [phone]  . ELECTROPHYSIOLOGIC STUDY N/A 07/22/2015   Procedure: SVT Ablation;  Surgeon: Will Meredith Leeds, MD;  Location: [facility];  Service: Cardiovascular;  Laterality: N/A;  . MM BREAST STEREO BIOPSY LEFT ([facility] HX)    . PARTIAL COLECTOMY  2005  . SMALL INTESTINE SURGERY    . THYROIDECTOMY  2008  . TOTAL HIP ARTHROPLASTY Right 08/19/2017   Procedure: RIGHT TOTAL HIP ARTHROPLASTY ANTERIOR APPROACH;  Surgeon: Rod Can, MD;  Location: [facility];  Service: Orthopedics;  Laterality: Right;    Current Outpatient Medications  Medication Sig Dispense Refill  . alendronate (FOSAMAX) 70 MG tablet Take 1 tablet (70 mg total) by mouth every 7 (seven) days. Take with a full glass of water on an empty stomach. 12 tablet 3  . Calcium Carbonate-Vitamin D (CALTRATE 600+D PO) Take by mouth.    [name]  donepezil (ARICEPT) 10 MG tablet Take one tab at bedtime. 90 tablet 4  . fluocinonide ointment (LIDEX) 9.32 % Apply 1 application topically 2 (two) times daily. 60 g 1  . ibuprofen (ADVIL,MOTRIN) 200 MG tablet Take 400 mg by mouth daily as needed for moderate pain.    [name] levothyroxine (SYNTHROID) 88 MCG tablet TAKE 1 TABLET BY MOUTH EVERY MORNING 30 MINUTES BEFORE BREAKFAST. 90 tablet 1  . memantine (NAMENDA) 10 MG tablet TAKE 1 TABLET BY MOUTH 2 TIMES DAILY. 180 tablet 4  . rosuvastatin (CRESTOR) 10 MG tablet TAKE 1 TABLET BY MOUTH DAILY. 90 tablet 3   No current facility-administered medications for this visit.     ALLERGIES: Patient has no known allergies.  Family History  Problem Relation Age of Onset  . Stroke Mother   . Alzheimer's disease Mother   . Heart failure Brother   . Alzheimer's disease Sister   . Other Father         natural causes  . Heart attack Brother   . Colon cancer Other        nephew  . Ulcerative colitis Sister        colon removed    Social History   Socioeconomic History  . Marital status: Divorced    Spouse name: Not on file  . Number of children: 1  . Years of education: HS  . Highest education level: Not on file  Occupational History  . Occupation: retired [employer]: RETIRED  Tobacco Use  . Smoking status: Never Smoker  . Smokeless tobacco: Never Used  Vaping Use  . Vaping Use: Never used  Substance and Sexual Activity  . Alcohol use: No    Alcohol/week: 0.0 standard drinks  . Drug use: No  . Sexual activity: Never  Other Topics Concern  . Not on file  Social History Narrative   Regular exercise--no   Lives at home alone.   Right-handed.   1 cup caffeine daily.            Epworth Sleepiness Scale      Total: 4               Social Determinants of Health   Financial Resource Strain: Not on file  Food Insecurity: Not on file  Transportation Needs: Not on file  Physical Activity: Not on file  Stress: Not on file  Social Connections: Not on file  Intimate Partner Violence: Not on file    Review of Systems  See HPI.  PHYSICAL EXAMINATION:    BP (!) 158/80   Pulse 70   Ht 5' 4.5" (1.638 m)   Wt 146 lb (66.2 kg)   SpO2 98%   BMI 24.67 kg/m     General appearance: alert, cooperative and appears stated age   Pelvic: External genitalia:  no lesions              Urethra:  normal appearing urethra with no masses, tenderness or lesions              Bartholins and Skenes: normal                 Vagina: erythema of the vaginal apex.               Cervix: no lesions                Bimanual Exam:  Uterus:  normal size, contour, position, consistency, mobility, non-tender  Adnexa: no mass, fullness, tenderness         Ring with support pessary removed, cleanses and replaced.  Yellow green discharge noted.    Chaperone was present for exam.  ASSESSMENT  Pessary maintenance.  Vaginal inflammation from pessary use.   PLAN  Ok to continue pessary use.  Will have patient start to use vaginal Estrace cream 1 gram with pessary care appointments in the office.  I discussed importance of yearly mammograms as vaginal estrogen can cause an existing breast cancer to grow.  Fu for breast and pelvic exam and pessary check in 6 weeks.   22 min  total time was spent for this patient encounter, including preparation, face-to-face counseling with the patient, coordination of care, and documentation of the encounter.

## 2020-10-22 ENCOUNTER — Encounter: Payer: Self-pay | Admitting: Obstetrics and Gynecology

## 2020-10-22 ENCOUNTER — Ambulatory Visit (INDEPENDENT_AMBULATORY_CARE_PROVIDER_SITE_OTHER): Payer: Medicare Other | Admitting: Obstetrics and Gynecology

## 2020-10-22 ENCOUNTER — Other Ambulatory Visit: Payer: Self-pay

## 2020-10-22 VITALS — BP 158/80 | HR 70 | Ht 64.5 in | Wt 146.0 lb

## 2020-10-22 DIAGNOSIS — T8369XA Infection and inflammatory reaction due to other prosthetic device, implant and graft in genital tract, initial encounter: Secondary | ICD-10-CM | POA: Diagnosis not present

## 2020-10-22 DIAGNOSIS — Z4689 Encounter for fitting and adjustment of other specified devices: Secondary | ICD-10-CM

## 2020-10-22 DIAGNOSIS — N760 Acute vaginitis: Secondary | ICD-10-CM

## 2020-10-22 MED ORDER — ESTRADIOL 0.1 MG/GM VA CREA: TOPICAL_CREAM | VAGINAL | 0 refills | Status: DC

## 2020-10-22 NOTE — Patient Instructions (Signed)
Please pick up your prescription for estrogen cream at your pharmacy.  Please bring this to your appointments each time you see your gynecologist.

## 2020-11-14 ENCOUNTER — Encounter: Payer: Self-pay | Admitting: Internal Medicine

## 2020-11-19 ENCOUNTER — Telehealth: Payer: Self-pay

## 2020-11-19 DIAGNOSIS — U071 COVID-19: Secondary | ICD-10-CM

## 2020-11-19 MED ORDER — PAXLOVID 20 X 150 MG & 10 X 100MG PO TBPK: 3.0000 | ORAL_TABLET | Freq: Two times a day (BID) | ORAL | 0 refills | Status: DC

## 2020-11-19 NOTE — Telephone Encounter (Signed)
We had a long conversation about this patient.  I had this with her daughter [name].  We also talked about some other family members with COVID, and another family member with a acute hip fracture.  The patient is 75 years old.  Several days only into her COVID.  I think that she would benefit from antivirals, so I sent in some Paxlovid.    ICD-10-CM   1. COVID-19  U07.1     Meds ordered this encounter  Medications  . Nirmatrelvir & Ritonavir (PAXLOVID) 20 x 150 MG & 10 x 100MG  TBPK    Sig: Take 3 tablets by mouth 2 (two) times daily. Take nirmatrelvir (150mg ) 2 tablet twice daily for 5 days and ritonavir (100mg ) 1 tablet twice daily for 5 days    Dispense:  30 tablet    Refill:  0

## 2020-11-19 NOTE — Telephone Encounter (Signed)
Patients daughter called and stated that patient tested positive for COVID yesterday. Patient only complains of nasal congestion and no other acute symptoms. Instructed patients daughter to make sure her mother is staying well hydrated, eating a well balanced meal, and getting rest. Patients daughter verbalized understanding. UC and ED precautions verbalized understanding. Patients daughter wanted to know if patient would be a candidate for the antiviral medication? Please advise.

## 2020-11-19 NOTE — Telephone Encounter (Signed)
I just fielded 20+ minute conversation with the patient's daughter about this and other family members with Covid risk.  I sent in Paxlovid after risk assessment.

## 2020-12-03 NOTE — Progress Notes (Signed)
GYNECOLOGY  VISIT   HPI: 75 y.o.   Divorced  Caucasian  female   G1P1 with No LMP recorded. Patient is postmenopausal.   here for breast and pelvic exam. Also needs pessary check.  Her niece was present for the discussion portion of the visit at the end to review plan of care.   She has vaginal estrogen cream with her today to be used with each pessary cleaning in the office.   GYNECOLOGIC HISTORY: No LMP recorded. Patient is postmenopausal. Contraception: PMP Menopausal hormone therapy:  none Last mammogram: 06-24-20 3D/Neg/BiRads1 Last pap smear: ASCUS:Neg HR HPV 05/16/2018        OB History    Gravida  1   Para  1   Term      Preterm      AB      Living  1     SAB      IAB      Ectopic      Multiple      Live Births                 Patient Active Problem List   Diagnosis Date Noted  . Dementia ([place]) 06/10/2020  . Primary osteoarthritis of right hip 08/19/2017  . Mild cognitive impairment 10/19/2016  . History of colonic polyps 10/15/2015  . 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 Aortic calcification ([place]) 04/22/2015  . Major depressive disorder, recurrent episode, in full remission ([place]) 07/24/2010  . OSTEOPOROSIS 07/24/2010  . INSOMNIA 08/29/2009  . Hyperlipidemia with target LDL less than 100 12/19/2008  . Hypothyroidism 07/02/2008  . OSTEOARTHRITIS 07/02/2008  . NEPHROLITHIASIS, HX OF 07/02/2008    Past Medical History:  Diagnosis Date  . ASCUS of cervix with negative high risk HPV 05/2018   Repeat Pap smear one year interval  . 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  [name] Dysrhythmia    hx of SVT - 2017   . History of colonic polyps   . History of kidney stones   . Hypothyroidism   . Liver cyst   . Memory loss   . Nephrolithiasis   . Osteoarthritis   . Osteoporosis   . SVT (supraventricular tachycardia) ([place]) 05/07/2015    Past Surgical History:  Procedure  Laterality Date  . ABLATION    . BACK SURGERY     [phone]  . ELECTROPHYSIOLOGIC STUDY N/A 07/22/2015   Procedure: SVT Ablation;  Surgeon: Will Meredith Leeds, MD;  Location: [facility];  Service: Cardiovascular;  Laterality: N/A;  . MM BREAST STEREO BIOPSY LEFT ([place] HX)    . PARTIAL COLECTOMY  2005  . SMALL INTESTINE SURGERY    . THYROIDECTOMY  2008  . TOTAL HIP ARTHROPLASTY Right 08/19/2017   Procedure: RIGHT TOTAL HIP ARTHROPLASTY ANTERIOR APPROACH;  Surgeon: Rod Can, MD;  Location: [facility];  Service: Orthopedics;  Laterality: Right;    Current Outpatient Medications  Medication Sig Dispense Refill  . alendronate (FOSAMAX) 70 MG tablet Take 1 tablet (70 mg total) by mouth every 7 (seven) days. Take with a full glass of water on an empty stomach. 12 tablet 3  . Calcium Carbonate-Vitamin D (CALTRATE 600+D PO) Take by mouth.    . donepezil (ARICEPT) 10 MG tablet Take one tab at bedtime. 90 tablet 4  . estradiol (ESTRACE) 0.1 MG/GM vaginal cream Use 1 gram per vagina with pessary care in the office. 42.5 g 0  .  fluocinonide ointment (LIDEX) 7.74 % Apply 1 application topically 2 (two) times daily. 60 g 1  . ibuprofen (ADVIL,MOTRIN) 200 MG tablet Take 400 mg by mouth daily as needed for moderate pain.    [name] levothyroxine (SYNTHROID) 88 MCG tablet TAKE 1 TABLET BY MOUTH EVERY MORNING 30 MINUTES BEFORE BREAKFAST. 90 tablet 1  . memantine (NAMENDA) 10 MG tablet TAKE 1 TABLET BY MOUTH 2 TIMES DAILY. 180 tablet 4  . Nirmatrelvir & Ritonavir (PAXLOVID) 20 x 150 MG & 10 x 100MG  TBPK Take 3 tablets by mouth 2 (two) times daily. Take nirmatrelvir (150mg ) 2 tablet twice daily for 5 days and ritonavir (100mg ) 1 tablet twice daily for 5 days 30 tablet 0  . rosuvastatin (CRESTOR) 10 MG tablet TAKE 1 TABLET BY MOUTH DAILY. 90 tablet 3   No current facility-administered medications for this visit.     ALLERGIES: Patient has no known allergies.  Family History  Problem Relation Age of  Onset  . Stroke Mother   . Alzheimer's disease Mother   . Heart failure Brother   . Alzheimer's disease Sister   . Other Father        natural causes  . Heart attack Brother   . Colon cancer Other        nephew  . Ulcerative colitis Sister        colon removed    Social History   Socioeconomic History  . Marital status: Divorced    Spouse name: Not on file  . Number of children: 1  . Years of education: HS  . Highest education level: Not on file  Occupational History  . Occupation: retired [employer]: RETIRED  Tobacco Use  . Smoking status: Never Smoker  . Smokeless tobacco: Never Used  Vaping Use  . Vaping Use: Never used  Substance and Sexual Activity  . Alcohol use: No    Alcohol/week: 0.0 standard drinks  . Drug use: No  . Sexual activity: Never  Other Topics Concern  . Not on file  Social History Narrative   Regular exercise--no   Lives at home alone.   Right-handed.   1 cup caffeine daily.            Epworth Sleepiness Scale      Total: 4               Social Determinants of Health   Financial Resource Strain: Not on file  Food Insecurity: Not on file  Transportation Needs: Not on file  Physical Activity: Not on file  Stress: Not on file  Social Connections: Not on file  Intimate Partner Violence: Not on file    Review of Systems  All other systems reviewed and are negative.   PHYSICAL EXAMINATION:    BP (!) 144/70   Pulse 78   Ht 5' 4.5" (1.638 m)   Wt 146 lb (66.2 kg)   SpO2 97%   BMI 24.67 kg/m     General appearance: alert, cooperative and appears stated age Head: Normocephalic, without obvious abnormality, atraumatic Neck: no adenopathy, supple, symmetrical, trachea midline and thyroid normal to inspection and palpation Lungs: clear to auscultation bilaterally Breasts: normal appearance, no masses or tenderness, No nipple retraction or dimpling, No nipple discharge or bleeding, No axillary  or supraclavicular adenopathy Heart: regular rate and rhythm Abdomen: soft, non-tender, no masses,  no organomegaly Lymph nodes: Cervical, supraclavicular, and axillary nodes normal. No abnormal inguinal nodes palpated Neurologic: Grossly normal  Pelvic: External genitalia:  no lesions              Urethra:  normal appearing urethra with no masses, tenderness or lesions              Bartholins and Skenes: normal                 Vagina: normal appearing vagina with normal color and discharge, no lesions.  Mild inflammation of the posterior vaginal apex.                Cervix: no lesions                Bimanual Exam:  Uterus:  normal size, contour, position, consistency, mobility, non-tender              Adnexa: no mass, fullness, tenderness              Rectal exam: Yes.  .  Confirms.              Anus:  normal sphincter tone, no lesions  Pessary removed, cleansed and replaced with vaginal estrace cream 1.5 grams.  Patient was not aware she had the pessary inside her vagina.  Chaperone was present for exam.  ASSESSMENT  Screening breast exam.  Pelvic exam with abnormal finding of pelvic relaxation.  Cervical cancer screening.  Prior ASCUS pap and negative HR HPV.  Encounter for rectal exam.  Pessary maintenance.  Memory loss.  On Aricept and Namenda.  PLAN  Pap and reflex HR HPV testing.  Continue yearly mammograms.  Continue pessary use.  Fu in 3 months for next pessary check.  She will bring the estrogen cream with her each time.   24 min  total time was spent for this patient encounter, including preparation, face-to-face counseling with the patient, coordination of care, and documentation of the encounter.

## 2020-12-04 ENCOUNTER — Other Ambulatory Visit: Payer: Self-pay

## 2020-12-04 ENCOUNTER — Encounter: Payer: Self-pay | Admitting: Obstetrics and Gynecology

## 2020-12-04 ENCOUNTER — Other Ambulatory Visit (HOSPITAL_COMMUNITY)
Admission: RE | Admit: 2020-12-04 | Discharge: 2020-12-04 | Disposition: A | Discharge status: Home / Self Care | Payer: Medicare Other | Source: Ambulatory Visit | Attending: Obstetrics and Gynecology | Admitting: Obstetrics and Gynecology

## 2020-12-04 ENCOUNTER — Ambulatory Visit (INDEPENDENT_AMBULATORY_CARE_PROVIDER_SITE_OTHER): Payer: Medicare Other | Admitting: Obstetrics and Gynecology

## 2020-12-04 VITALS — BP 144/70 | HR 78 | Ht 64.5 in | Wt 146.0 lb

## 2020-12-04 DIAGNOSIS — Z9189 Other specified personal risk factors, not elsewhere classified: Secondary | ICD-10-CM | POA: Diagnosis not present

## 2020-12-04 DIAGNOSIS — N8189 Other female genital prolapse: Secondary | ICD-10-CM | POA: Diagnosis not present

## 2020-12-04 DIAGNOSIS — R87610 Atypical squamous cells of undetermined significance on cytologic smear of cervix (ASC-US): Secondary | ICD-10-CM | POA: Insufficient documentation

## 2020-12-04 DIAGNOSIS — Z124 Encounter for screening for malignant neoplasm of cervix: Secondary | ICD-10-CM

## 2020-12-04 DIAGNOSIS — Z008 Encounter for other general examination: Secondary | ICD-10-CM

## 2020-12-04 DIAGNOSIS — Z1151 Encounter for screening for human papillomavirus (HPV): Secondary | ICD-10-CM | POA: Diagnosis not present

## 2020-12-04 DIAGNOSIS — Z01411 Encounter for gynecological examination (general) (routine) with abnormal findings: Secondary | ICD-10-CM | POA: Diagnosis not present

## 2020-12-04 DIAGNOSIS — Z4689 Encounter for fitting and adjustment of other specified devices: Secondary | ICD-10-CM

## 2020-12-04 DIAGNOSIS — Z1239 Encounter for other screening for malignant neoplasm of breast: Secondary | ICD-10-CM | POA: Diagnosis not present

## 2020-12-04 NOTE — Patient Instructions (Signed)

## 2020-12-11 LAB — CYTOLOGY - PAP
Comment: NEGATIVE
Diagnosis: UNDETERMINED — AB
High risk HPV: NEGATIVE

## 2020-12-23 ENCOUNTER — Other Ambulatory Visit: Payer: Self-pay | Admitting: Family Medicine

## 2021-02-11 ENCOUNTER — Telehealth: Payer: Self-pay | Admitting: Family Medicine

## 2021-02-11 ENCOUNTER — Encounter: Payer: Self-pay | Admitting: Family Medicine

## 2021-02-11 NOTE — Telephone Encounter (Signed)
Mrs. Monica Irwin returned phone call . Related the message and set her up for 9/7 @ 940 am

## 2021-02-11 NOTE — Telephone Encounter (Signed)
Please schedule MWV with Calandra and CPE with fasting labs prior with Dr. Lorelei Pont.

## 2021-02-11 NOTE — Telephone Encounter (Signed)
Called pt to schedule appt. Pt was not home so I spoke with brother in law and left a message to have her call the office.

## 2021-02-26 ENCOUNTER — Other Ambulatory Visit: Payer: Self-pay | Admitting: Family Medicine

## 2021-02-26 DIAGNOSIS — Z79899 Other long term (current) drug therapy: Secondary | ICD-10-CM

## 2021-02-26 DIAGNOSIS — E039 Hypothyroidism, unspecified: Secondary | ICD-10-CM

## 2021-02-26 DIAGNOSIS — E559 Vitamin D deficiency, unspecified: Secondary | ICD-10-CM

## 2021-02-26 DIAGNOSIS — R7309 Other abnormal glucose: Secondary | ICD-10-CM

## 2021-03-12 ENCOUNTER — Other Ambulatory Visit: Payer: Medicare Other

## 2021-03-12 ENCOUNTER — Other Ambulatory Visit: Payer: Self-pay

## 2021-03-12 ENCOUNTER — Other Ambulatory Visit (INDEPENDENT_AMBULATORY_CARE_PROVIDER_SITE_OTHER): Payer: Medicare Other

## 2021-03-12 ENCOUNTER — Ambulatory Visit: Payer: Medicare Other | Admitting: Obstetrics and Gynecology

## 2021-03-12 DIAGNOSIS — E559 Vitamin D deficiency, unspecified: Secondary | ICD-10-CM | POA: Diagnosis not present

## 2021-03-12 DIAGNOSIS — Z79899 Other long term (current) drug therapy: Secondary | ICD-10-CM | POA: Diagnosis not present

## 2021-03-12 DIAGNOSIS — E039 Hypothyroidism, unspecified: Secondary | ICD-10-CM | POA: Diagnosis not present

## 2021-03-12 DIAGNOSIS — R7309 Other abnormal glucose: Secondary | ICD-10-CM

## 2021-03-12 LAB — HEMOGLOBIN A1C: Hgb A1c MFr Bld: 6.4 % (ref 4.6–6.5)

## 2021-03-12 LAB — CBC WITH DIFFERENTIAL/PLATELET
Basophils Absolute: 0 10*3/uL (ref 0.0–0.1)
Basophils Relative: 0.8 % (ref 0.0–3.0)
Eosinophils Absolute: 0.1 10*3/uL (ref 0.0–0.7)
Eosinophils Relative: 2.3 % (ref 0.0–5.0)
HCT: 45.6 % (ref 36.0–46.0)
Hemoglobin: 14.9 g/dL (ref 12.0–15.0)
Lymphocytes Relative: 18 % (ref 12.0–46.0)
Lymphs Abs: 0.9 10*3/uL (ref 0.7–4.0)
MCHC: 32.7 g/dL (ref 30.0–36.0)
MCV: 94.7 fl (ref 78.0–100.0)
Monocytes Absolute: 0.5 10*3/uL (ref 0.1–1.0)
Monocytes Relative: 9.3 % (ref 3.0–12.0)
Neutro Abs: 3.5 10*3/uL (ref 1.4–7.7)
Neutrophils Relative %: 69.6 % (ref 43.0–77.0)
Platelets: 238 10*3/uL (ref 150.0–400.0)
RBC: 4.81 Mil/uL (ref 3.87–5.11)
RDW: 13.6 % (ref 11.5–15.5)
WBC: 5 10*3/uL (ref 4.0–10.5)

## 2021-03-12 LAB — HEPATIC FUNCTION PANEL
ALT: 28 U/L (ref 0–35)
AST: 22 U/L (ref 0–37)
Albumin: 3.8 g/dL (ref 3.5–5.2)
Alkaline Phosphatase: 56 U/L (ref 39–117)
Bilirubin, Direct: 0.1 mg/dL (ref 0.0–0.3)
Total Bilirubin: 0.4 mg/dL (ref 0.2–1.2)
Total Protein: 6.1 g/dL (ref 6.0–8.3)

## 2021-03-12 LAB — BASIC METABOLIC PANEL
BUN: 18 mg/dL (ref 6–23)
CO2: 29 mEq/L (ref 19–32)
Calcium: 9 mg/dL (ref 8.4–10.5)
Chloride: 105 mEq/L (ref 96–112)
Creatinine, Ser: 0.92 mg/dL (ref 0.40–1.20)
GFR: 60.16 mL/min (ref >60.00)
Glucose, Bld: 88 mg/dL (ref 70–99)
Potassium: 4.5 mEq/L (ref 3.5–5.1)
Sodium: 141 mEq/L (ref 135–145)

## 2021-03-12 LAB — VITAMIN D 25 HYDROXY (VIT D DEFICIENCY, FRACTURES): VITD: 42.21 ng/mL (ref 30.00–100.00)

## 2021-03-12 LAB — T3, FREE: T3, Free: 2.6 pg/mL (ref 2.3–4.2)

## 2021-03-12 LAB — T4, FREE: Free T4: 0.92 ng/dL (ref 0.60–1.60)

## 2021-03-12 LAB — TSH: TSH: 13.71 u[IU]/mL — ABNORMAL HIGH (ref 0.35–5.50)

## 2021-03-18 ENCOUNTER — Encounter: Payer: Self-pay | Admitting: Family Medicine

## 2021-03-18 NOTE — Progress Notes (Signed)
Monica Schlink T. Laynee Lockamy, MD, [place] at [facility] [place], [address]  Phone: [phone]  FAX: [place] - 75 y.o. female  MRN [id number]  Date of Birth: 21-May-1946  Date: 03/19/2021  PCP: Owens Loffler, MD  Referral: Owens Loffler, MD  Chief Complaint  Patient presents with   Medicare Wellness    This visit occurred during the SARS-CoV-2 public health emergency.  Safety protocols were in place, including screening questions prior to the visit, additional usage of staff PPE, and extensive cleaning of exam room while observing appropriate contact time as indicated for disinfecting solutions.   Patient Care Team: Owens Loffler, MD as PCP - General Subjective:   Monica Irwin is a 75 y.o. pleasant patient who presents for a medicare wellness examination:  Health Maintenance Summary Reviewed and updated, unless pt declines services.  Tobacco History Reviewed. Non-smoker Alcohol: No concerns, no excessive use Exercise Habits: Walking  STD concerns: none Drug Use: None Birth control method: n/a Menses regular: n/a Lumps or breast concerns: no  Colon?  Suggest cologuard Covid bivalent  ? About tsh Free T3 and free T4 normal Lab Results  Component Value Date   TSH 13.71 (H) 03/12/2021     Memory continues to get worse.  This is her primary issue at this point.  133 pounds  Health Maintenance  Topic Date Due   COVID-19 Vaccine (4 - Booster for Pfizer series) 08/26/2020   COLONOSCOPY (Pts 45-55yr Insurance coverage will need to be confirmed)  10/29/2020   TETANUS/TDAP  01/21/2027   INFLUENZA VACCINE  Completed   DEXA SCAN  Completed   Hepatitis C Screening  Completed   PNA vac Low Risk Adult  Completed   Zoster Vaccines- Shingrix  Completed   HPV VACCINES  Aged Out   Immunization History  Administered Date(s) Administered   Fluad Quad(high Dose 65+)  04/06/2019, 03/19/2021   Influenza Whole 04/10/2009, 03/13/2010   Influenza, High Dose Seasonal PF 04/01/2018   Influenza,inj,Quad PF,6+ Mos 04/22/2015, 03/30/2016, 05/24/2017   Influenza-Unspecified 04/12/2013, 04/12/2017   PFIZER(Purple Top)SARS-COV-2 Vaccination 08/01/2019, 08/21/2019, 05/26/2020   Pneumococcal Conjugate-13 11/08/2013   Pneumococcal Polysaccharide-23 05/22/2010   Td 07/13/2001   Tdap 01/20/2017   Zoster Recombinat (Shingrix) 02/15/2019, 04/24/2019   Zoster, Live 07/13/2001    Patient Active Problem List   Diagnosis Date Noted   Dementia ([place] 06/10/2020   Primary osteoarthritis of right hip 08/19/2017   History of colonic polyps 10/15/2015   AVNRT (AV nodal re-entry tachycardia) ([place] 08/19/2015   Aortic calcification ([place] 04/22/2015   Major depressive disorder, recurrent episode, in full remission ([place] 07/24/2010   OSTEOPOROSIS 07/24/2010   INSOMNIA 08/29/2009   Hyperlipidemia with target LDL less than 100 12/19/2008   Hypothyroidism 07/02/2008   OSTEOARTHRITIS 07/02/2008   NEPHROLITHIASIS, HX OF 07/02/2008    Past Medical History:  Diagnosis Date   ASCUS of cervix with negative high risk HPV 05/2018   Repeat Pap smear one year interval   AVNRT (AV nodal re-entry tachycardia) ([place] 08/19/2015   S/p successful ablation 07/2015.   Dysrhythmia    hx of SVT - 2017    History of colonic polyps    History of kidney stones    Hypothyroidism    Liver cyst    Memory loss    Nephrolithiasis    Osteoporosis     Past Surgical History:  Procedure Laterality Date   ABLATION     BACK  SURGERY     [phone]   ELECTROPHYSIOLOGIC STUDY N/A 07/22/2015   Procedure: SVT Ablation;  Surgeon: Will Meredith Leeds, MD;  Location: [facility];  Service: Cardiovascular;  Laterality: N/A;   MM BREAST STEREO BIOPSY LEFT ([place] HX)     PARTIAL COLECTOMY  2005   SMALL INTESTINE SURGERY     THYROIDECTOMY  2008   TOTAL HIP ARTHROPLASTY Right 08/19/2017    Procedure: RIGHT TOTAL HIP ARTHROPLASTY ANTERIOR APPROACH;  Surgeon: Rod Can, MD;  Location: [facility];  Service: Orthopedics;  Laterality: Right;    Family History  Problem Relation Age of Onset   Stroke Mother    Alzheimer's disease Mother    Heart failure Brother    Alzheimer's disease Sister    Other Father        natural causes   Heart attack Brother    Colon cancer Other        nephew   Ulcerative colitis Sister        colon removed    Past Medical History, Surgical History, Social History, Family History, Problem List, Medications, and Allergies have been reviewed and updated if relevant.  Review of Systems: Pertinent positives are listed above.  Otherwise, a full 14 point review of systems has been done in full and it is negative except where it is noted positive.  Objective:   BP 110/64   Pulse 70   Temp 97.9 F (36.6 C) (Temporal)   Ht _0  (1.626 m)   Wt 133 lb 12 oz (60.7 kg)   SpO2 97%   BMI 22.96 kg/m  Fall Risk  03/19/2021 03/07/2020 02/13/2019 05/24/2017 03/30/2016  Falls in the past year? 0 0 0 No No   Ideal Body Weight: Weight in (lb) to have BMI = 25: 145.3 Hearing Screening  Method: Audiometry   _1  _2  _3  _4   Right ear _5 Left ear _6 0   Vision Screening   Right eye Left eye Both eyes  Without correction _7  With correction      Depression screen [facility] 2/9 03/19/2021 03/07/2020 02/13/2019 05/24/2017 03/30/2016  Decreased Interest 0 0 0 0 0  Down, Depressed, Hopeless 0 0 0 0 0  PHQ - 2 Score 0 0 0 0 0  Altered sleeping - - - 0 -  Tired, decreased energy - - - 0 -  Change in appetite - - - 0 -  Feeling bad or failure about yourself  - - - 0 -  Trouble concentrating - - - 0 -  Moving slowly or fidgety/restless - - - 0 -  Suicidal thoughts - - - 0 -  PHQ-9 Score - - - 0 -  Difficult doing work/chores - - - Not difficult at all -     GEN: well developed, well nourished, no acute distress Eyes:  conjunctiva and lids normal, PERRLA, EOMI ENT: TM clear, nares clear, oral exam WNL Neck: supple, no lymphadenopathy, no thyromegaly, no JVD Pulm: clear to auscultation and percussion, respiratory effort normal CV: regular rate and rhythm, S1-S2, no murmur, rub or gallop, no bruits Chest: no scars, masses, no lumps BREAST: breast exam declined GI: soft, non-tender; no hepatosplenomegaly, masses; active bowel sounds all quadrants GU: GU exam declined Lymph: no cervical, axillary or inguinal adenopathy MSK: gait normal, muscle tone and strength WNL, no joint swelling, effusions, discoloration, crepitus  SKIN: clear, good turgor, color WNL, no rashes, lesions, or ulcerations Neuro: normal  mental status, normal strength, sensation, and motion Psych: alert; oriented to person, place and time, normally interactive and not anxious or depressed in appearance.   All labs reviewed with patient.  Lipids: Lab Results  Component Value Date   CHOL 152 02/28/2020   Lab Results  Component Value Date   HDL 51.00 02/28/2020   Lab Results  Component Value Date   LDLCALC 74 02/28/2020   Lab Results  Component Value Date   TRIG 137.0 02/28/2020   Lab Results  Component Value Date   CHOLHDL 3 02/28/2020   CBC: CBC Latest Ref Rng & Units 03/12/2021 02/28/2020 02/09/2019  WBC 4.0 - 10.5 K/uL 5.0 6.2 5.3  Hemoglobin 12.0 - 15.0 g/dL 14.9 15.3(H) 15.4(H)  Hematocrit 36.0 - 46.0 % 45.6 45.5 46.9(H)  Platelets 150.0 - 400.0 K/uL 238.0 228.0 193.7    Basic Metabolic Panel:    Component Value Date/Time   NA 141 03/12/2021 0825   NA 144 10/19/2016 1308   K 4.5 03/12/2021 0825   CL 105 03/12/2021 0825   CO2 29 03/12/2021 0825   BUN 18 03/12/2021 0825   BUN 22 10/19/2016 1308   CREATININE 0.92 03/12/2021 0825   CREATININE 0.79 07/16/2015 1228   GLUCOSE 88 03/12/2021 0825   CALCIUM 9.0 03/12/2021 0825   Hepatic Function Latest Ref Rng & Units 03/12/2021 02/28/2020 02/09/2019  Total Protein 6.0  - 8.3 g/dL 6.1 6.5 6.4  Albumin 3.5 - 5.2 g/dL 3.8 4.2 4.2  AST 0 - 37 U/L _0 ALT 0 - 35 U/L _1 Alk Phosphatase 39 - 117 U/L 56 76 74  Total Bilirubin 0.2 - 1.2 mg/dL 0.4 0.5 0.5  Bilirubin, Direct 0.0 - 0.3 mg/dL 0.1 0.1 0.1    Lab Results  Component Value Date   HGBA1C 6.4 03/12/2021   Lab Results  Component Value Date   TSH 13.71 (H) 03/12/2021    No results found.  Assessment and Plan:     ICD-10-CM   1. Healthcare maintenance  Z00.00     2. Need for influenza vaccination  Z23 Flu Vaccine QUAD High Dose(Fluad)    3. Colon cancer screening  Z12.11 Cologuard    4. Other specified hypothyroidism  E03.8 T4, free    T3, free    TSH     Globally, her primary issue continues to be worsening dementia.  She is currently on Aricept and Namenda.  They have an upcoming neurology appointment in a few months.  That point they are going to talk about this with her neurologist, and if they deem it appropriate, I am happy to keep prescribing these.  She does have a TSH of 13, but a normal free T3 and free T4.  Think is reasonable to increase her Synthroid dosing and then recheck in a month.  Possible that doses were missed, and the elevated TSH this may be functional subclinical hypothyroidism.  Health Maintenance Exam: The patient's preventative maintenance and recommended screening tests for an annual wellness exam were reviewed in full today. Brought up to date unless services declined.  Counselled on the importance of diet, exercise, and its role in overall health and mortality. The patient's FH and SH was reviewed, including their home life, tobacco status, and drug and alcohol status.  Follow-up in 1 year for physical exam or additional follow-up below.  I have personally reviewed the Medicare Annual Wellness questionnaire and have noted 1. The patient's medical and social history 2. Their  use of alcohol, tobacco or illicit drugs 3. Their current medications  and supplements 4. The patient's functional ability including ADL's, fall risks, home safety risks and hearing or visual             impairment. 5. Diet and physical activities 6. Evidence for depression or mood disorders 7. Reviewed Updated provider list, see scanned forms and [facility] Snapshot.  8. Reviewed whether or not the patient has HCPOA or living will, and discussed what this means with the patient.  Recommended she bring in a copy for his chart in [facility].  The patients weight, height, BMI and visual acuity have been recorded in the chart I have made referrals, counseling and provided education to the patient based review of the above and I have provided the pt with a written personalized care plan for preventive services.  I have provided the patient with a copy of your personalized plan for preventive services. Instructed to take the time to review along with their updated medication list.  Follow-up: Return for 1 month thyroid lab draw. Or follow-up in 1 year unless noted.  Future Appointments  Date Time Provider [place]  06/10/2021 10:15 AM Suzzanne Cloud, NP [facility] None    Meds ordered this encounter  Medications   levothyroxine (SYNTHROID) 100 MCG tablet    Sig: 1 tablet 30 minutes before breakfast    Dispense:  90 tablet    Refill:  3    Medications Discontinued During This Encounter  Medication Reason   Nirmatrelvir & Ritonavir (PAXLOVID) 20 x 150 MG & 10 x 100MG TBPK Completed Course   levothyroxine (SYNTHROID) 88 MCG tablet    Orders Placed This Encounter  Procedures   Flu Vaccine QUAD High Dose(Fluad)   Cologuard   T4, free   T3, free   TSH     Signed,  Catha Ontko T. Leesha Veno, MD   Allergies as of 03/19/2021   No Known Allergies      Medication List        Accurate as of March 19, 2021  2:30 PM. If you have any questions, ask your nurse or doctor.          STOP taking these medications    Paxlovid 20 x 150 MG & 10 x 100MG  Tbpk Generic drug: nirmatrelvir & ritonavir Stopped by: Owens Loffler, MD       TAKE these medications    alendronate 70 MG tablet Commonly known as: FOSAMAX TAKE 1 TABLET BY MOUTH EVERY 7 DAYS. TAKE WITH A FULL GLASS OF WATER ON AN EMPTY STOMACH.   CALTRATE 600+D PO Take by mouth.   donepezil 10 MG tablet Commonly known as: ARICEPT Take one tab at bedtime.   estradiol 0.1 MG/GM vaginal cream Commonly known as: ESTRACE Use 1 gram per vagina with pessary care in the office.   fluocinonide ointment 0.05 % Commonly known as: LIDEX Apply 1 application topically 2 (two) times daily.   ibuprofen 200 MG tablet Commonly known as: ADVIL Take 400 mg by mouth daily as needed for moderate pain.   levothyroxine 100 MCG tablet Commonly known as: SYNTHROID 1 tablet 30 minutes before breakfast What changed:  medication strength See the new instructions. Changed by: Owens Loffler, MD   memantine 10 MG tablet Commonly known as: NAMENDA TAKE 1 TABLET BY MOUTH 2 TIMES DAILY.   rosuvastatin 10 MG tablet Commonly known as: CRESTOR TAKE 1 TABLET BY MOUTH DAILY.

## 2021-03-19 ENCOUNTER — Encounter: Payer: Self-pay | Admitting: Family Medicine

## 2021-03-19 ENCOUNTER — Ambulatory Visit (INDEPENDENT_AMBULATORY_CARE_PROVIDER_SITE_OTHER): Payer: Medicare Other | Admitting: Family Medicine

## 2021-03-19 ENCOUNTER — Other Ambulatory Visit: Payer: Self-pay

## 2021-03-19 VITALS — BP 110/64 | HR 70 | Temp 97.9°F | Ht 64.0 in | Wt 133.8 lb

## 2021-03-19 DIAGNOSIS — Z0000 Encounter for general adult medical examination without abnormal findings: Secondary | ICD-10-CM

## 2021-03-19 DIAGNOSIS — E038 Other specified hypothyroidism: Secondary | ICD-10-CM

## 2021-03-19 DIAGNOSIS — Z23 Encounter for immunization: Secondary | ICD-10-CM | POA: Diagnosis not present

## 2021-03-19 DIAGNOSIS — Z1211 Encounter for screening for malignant neoplasm of colon: Secondary | ICD-10-CM

## 2021-03-19 MED ORDER — LEVOTHYROXINE SODIUM 100 MCG PO TABS: ORAL_TABLET | ORAL | 3 refills | Status: DC

## 2021-04-25 ENCOUNTER — Other Ambulatory Visit: Payer: Self-pay | Admitting: Family Medicine

## 2021-05-12 ENCOUNTER — Other Ambulatory Visit: Payer: Self-pay | Admitting: Family Medicine

## 2021-05-13 ENCOUNTER — Other Ambulatory Visit: Payer: Self-pay | Admitting: Family Medicine

## 2021-05-13 DIAGNOSIS — Z1231 Encounter for screening mammogram for malignant neoplasm of breast: Secondary | ICD-10-CM

## 2021-05-27 ENCOUNTER — Other Ambulatory Visit: Payer: Self-pay

## 2021-05-27 ENCOUNTER — Encounter: Payer: Self-pay | Admitting: Obstetrics and Gynecology

## 2021-05-27 ENCOUNTER — Ambulatory Visit (INDEPENDENT_AMBULATORY_CARE_PROVIDER_SITE_OTHER): Payer: Medicare Other | Admitting: Obstetrics and Gynecology

## 2021-05-27 VITALS — BP 128/60 | HR 56 | Ht 64.5 in | Wt 146.0 lb

## 2021-05-27 DIAGNOSIS — N8189 Other female genital prolapse: Secondary | ICD-10-CM | POA: Diagnosis not present

## 2021-05-27 DIAGNOSIS — Z4689 Encounter for fitting and adjustment of other specified devices: Secondary | ICD-10-CM | POA: Diagnosis not present

## 2021-05-27 NOTE — Progress Notes (Signed)
GYNECOLOGY  VISIT   HPI: 75 y.o.   Divorced  Caucasian  female   G1P1 with No LMP recorded. Patient is postmenopausal.   here for pessary check.    No bleeding, pain or discharge.   Voiding  well and have normal BMs.  Her last pessary check here was in May, 2022.   GYNECOLOGIC HISTORY: No LMP recorded. Patient is postmenopausal. 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   Dysrhythmia    hx of SVT - 2017    History of colonic polyps    History of kidney stones    Hypothyroidism    Liver cyst    Memory loss    Nephrolithiasis    Osteoporosis     Past Surgical History:  Procedure Laterality Date   ABLATION     BACK SURGERY     [phone]   ELECTROPHYSIOLOGIC STUDY N/A 07/22/2015   Procedure: SVT Ablation;  Surgeon: Will Meredith Leeds, MD;  Location: [facility];  Service: Cardiovascular;  Laterality: N/A;    PARTIAL COLECTOMY  2005   SMALL INTESTINE SURGERY     THYROIDECTOMY  2008   TOTAL HIP ARTHROPLASTY Right 08/19/2017   Procedure: RIGHT TOTAL HIP ARTHROPLASTY ANTERIOR APPROACH;  Surgeon: Rod Can, MD;  Location: [facility];  Service: Orthopedics;  Laterality: Right;    Current Outpatient Medications  Medication Sig Dispense Refill   alendronate (FOSAMAX) 70 MG tablet TAKE 1 TABLET BY MOUTH EVERY 7 DAYS. TAKE WITH A FULL GLASS OF WATER ON AN EMPTY STOMACH. 12 tablet 3   Calcium Carbonate-Vitamin D (CALTRATE 600+D PO) Take by mouth.     donepezil (ARICEPT) 10 MG tablet Take one tab at bedtime. 90 tablet 4   estradiol (ESTRACE) 0.1 MG/GM vaginal cream Use 1 gram per vagina with pessary care in the office. 42.5 g 0   fluocinonide ointment (LIDEX) 1.54 % Apply 1 application topically 2 (two) times daily. 60 g 1   ibuprofen (ADVIL,MOTRIN) 200 MG tablet Take 400 mg by mouth daily as needed for moderate pain.     levothyroxine (SYNTHROID) 100 MCG tablet 1 tablet 30  minutes before breakfast 90 tablet 3   memantine (NAMENDA) 10 MG tablet TAKE 1 TABLET BY MOUTH 2 TIMES DAILY. 180 tablet 4   rosuvastatin (CRESTOR) 10 MG tablet TAKE 1 TABLET BY MOUTH DAILY. 90 tablet 3   No current facility-administered medications for this visit.     ALLERGIES: Patient has no known allergies.  Family History  Problem Relation Age of Onset   Stroke Mother    Alzheimer's disease Mother    Heart failure Brother    Alzheimer's disease Sister    Other Father        natural causes   Heart attack Brother    Colon cancer Other        nephew   Ulcerative colitis Sister        colon removed    Social History   Socioeconomic History   Marital status: Divorced    Spouse name: Not on file   Number of children: 1   Years of education: HS   Highest education level: Not on file  Occupational History   Occupation: retired Therapist, music: RETIRED  Tobacco Use   Smoking status:  Never   Smokeless tobacco: Never  Vaping Use   Vaping Use: Never used  Substance and Sexual Activity   Alcohol use: No    Alcohol/week: 0.0 standard drinks   Drug use: No   Sexual activity: Never  Other Topics Concern   Not on file  Social History Narrative   Regular exercise--no   Lives at home alone.   Right-handed.   1 cup caffeine daily.            Epworth Sleepiness Scale      Total: 4               Social Determinants of Health   Financial Resource Strain: Not on file  Food Insecurity: Not on file  Transportation Needs: Not on file  Physical Activity: Not on file  Stress: Not on file  Social Connections: Not on file  Intimate Partner Violence: Not on file    Review of Systems  All other systems reviewed and are negative.  PHYSICAL EXAMINATION:    BP 128/60   Pulse (!) 56   Ht 5' 4.5" (1.638 m)   Wt 146 lb (66.2 kg)   SpO2 99%   BMI 24.67 kg/m     General appearance: alert, cooperative and appears stated age   Pelvic: External genitalia:  no lesions              Urethra:  normal appearing urethra with no masses, tenderness or lesions              Bartholins and Skenes: normal                 Vagina: normal appearing vagina with normal color and discharge, no lesions              Cervix: no lesions                Bimanual Exam:  Uterus:  normal size, contour, position, consistency, mobility, non-tender              Adnexa: no mass, fullness, tenderness    Pessary ring with support removed, cleansed and replaced.   Chaperone was present for exam: yes  ASSESSMENT  Pelvic organ relaxation.  Pessary maintenance.   PLAN  Continue pessary care.  [place] for follow up visit  in 6 months.  She will call for any vaginal bleeding, unusual vaginal discharge, pain, or any other concern.   An After Visit Summary was printed and given to the patient.

## 2021-06-09 ENCOUNTER — Telehealth: Payer: Self-pay

## 2021-06-09 NOTE — Telephone Encounter (Signed)
I spoke with Monica Irwin and pt was seen at [facility] in [place] (pt was traveling);pts symptoms started on 06/05/21 and pt was tested on 06/06/21 at [facility] in [place] for covid, RSV and Flu which results were all negative. Pt did test + for strep. Pt was given penicillin as abx for 10 days. Pt started abx on 06/06/21. Today pt is feeling better and not nearly as congested and coughing and S/T is better. Pt has also been taking OTC decongestant.  [name] said "right now pt does not have a cough." [name] will cb if needed for appt and when pt finishes abx if not completely cleared of symptoms. Sending note to Dr Lorelei Pont and Butch Penny CMA. UC & ED precautions given and [name] voiced understanding.

## 2021-06-09 NOTE — Telephone Encounter (Signed)
[place] Night - Client TELEPHONE ADVICE RECORD [facility] Patient Name: Monica Irwin [facility] Gender: Female DOB: Sep 12, 1945 Age: 75 Y 60 M 3 D Return Phone Number: [phone] (Primary), [phone] (Secondary) Address: City/ State/ Zip: [place]  [address] Client [facility] Night - Client Client Site [place] Provider Owens Loffler - MD Contact Type Call Who Is Calling Patient / Member / Family / Caregiver Call Type Triage / Clinical Caller Name [name] Relationship To Patient Daughter Return Phone Number [phone] (Primary) Chief Complaint Cough Reason for Call Symptomatic / Request for Health Information Initial Comment caller states her mother has developed a heavy cough along with mucus and congestion / caller concerned as they are traveling and will be home today after 12 noon Translation No Nurse Assessment Nurse: Velta Addison, RN, Crystal Date/Time ([place] Time): 06/06/2021 7:09:35 AM Confirm and document reason for call. If symptomatic, describe symptoms. ---caller states her mother has developed a heavy cough along with mucus and congestion / caller concerned as they are traveling and will be home today after 12 noon. Caller states she had sinus normally but yesterday she started with cough. Has not been tested for Covid due to traveling. Does not feel feverish. Does the patient have any new or worsening symptoms? ---Yes Will a triage be completed? ---Yes Related visit to physician within the last 2 weeks? ---No Does the PT have any chronic conditions? (i.e. diabetes, asthma, this includes High risk factors for pregnancy, etc.) ---Yes List chronic conditions. ---Cardiac ablation; aricept/Memory/Alzheimers, cholesterol Is this a behavioral health or substance abuse call? ---No Guidelines Guideline Title Affirmed Question Affirmed Notes Nurse Date/Time  (Eastern Time) Cough - Acute NonProductive SEVERE coughing spells (e.g., whooping sound after coughing, Parrott, RN, Crystal 06/06/2021 7:12:00 AM PLEASE NOTE: All timestamps contained within this report are represented as [place] Standard Time. CONFIDENTIALTY NOTICE: This fax transmission is intended only for the addressee. It contains information that is legally privileged, confidential or otherwise protected from use or disclosure. If you are not the intended recipient, you are strictly prohibited from reviewing, disclosing, copying using or disseminating any of this information or taking any action in reliance on or regarding this information. If you have received this fax in error, please notify us immediately by telephone so that we can arrange for its return to [place]. Phone: [phone], Toll-Free: [phone], Fax: [phone] Page: 2 of 2 Call Id: [id number] Guidelines Guideline Title Affirmed Question Affirmed Notes Nurse Date/Time Eilene Ghazi Time) vomiting after coughing) Disp. Time Eilene Ghazi Time) Disposition Final User 06/06/2021 7:17:47 AM See PCP within 24 Hours Yes Parrott, RN, Interior and spatial designer Understands Yes PreDisposition Did not know what to do Care Advice Given Per Guideline SEE PCP WITHIN 24 HOURS: * IF OFFICE WILL BE OPEN: You need to be examined within the next 24 hours. Call your doctor (or NP/PA) when the office opens and make an appointment. CARE ADVICE given per Cough - Acute Non-Productive (Adult) guideline. * You become worse CALL BACK IF: * Difficulty breathing occurs Referrals GO TO FACILITY UNDECIDE

## 2021-06-10 ENCOUNTER — Ambulatory Visit: Payer: Medicare Other | Admitting: Neurology

## 2021-06-18 ENCOUNTER — Other Ambulatory Visit: Payer: Self-pay | Admitting: *Deleted

## 2021-06-18 ENCOUNTER — Other Ambulatory Visit: Payer: Self-pay | Admitting: Neurology

## 2021-06-25 ENCOUNTER — Ambulatory Visit
Admission: RE | Admit: 2021-06-25 | Discharge: 2021-06-25 | Disposition: A | Discharge status: Home / Self Care | Payer: Medicare Other | Source: Ambulatory Visit | Attending: Family Medicine | Admitting: Family Medicine

## 2021-06-25 DIAGNOSIS — Z1231 Encounter for screening mammogram for malignant neoplasm of breast: Secondary | ICD-10-CM

## 2021-06-30 ENCOUNTER — Telehealth: Payer: Self-pay | Admitting: Neurology

## 2021-06-30 NOTE — Telephone Encounter (Signed)
Pt's daughter called asking if her PCP can take over refilling her medications, since her mother is declining and we can't really do anything for her mother. Pt's daughter requesting a call back.

## 2021-06-30 NOTE — Telephone Encounter (Signed)
I called patient's daughter, [name], per DPR. No answer, left a message asking her to call me back.  Per Lucita Ferrara last note: "(will discuss with PCP to see if will assume refills, if so, return here as needed)"  Patient may discuss memory medication refills with PCP and if they will take over refills then patient may follow up here as needed. Please advise patient/patient's daughter of this when they call back.

## 2021-07-10 ENCOUNTER — Telehealth: Payer: Self-pay | Admitting: Family Medicine

## 2021-07-10 ENCOUNTER — Ambulatory Visit: Payer: Medicare Other | Admitting: Neurology

## 2021-07-10 NOTE — Telephone Encounter (Signed)
Pt daughter [name] called in requesting to get FL2 form filled out for pt. Would like a call back to discuss [phone]

## 2021-07-10 NOTE — Telephone Encounter (Signed)
Returned [name]'s call and got voicemail.  Left message to return call to discuss FL-2 form.

## 2021-07-15 NOTE — Telephone Encounter (Signed)
Aware, I will see her then  

## 2021-07-15 NOTE — Telephone Encounter (Signed)
Dr. Lorelei Pont had a cancellation so appointment moved to 07/16/2020 at 3:20 pm with Dr. Lorelei Pont.  [name] aware of appointment change and GO location.

## 2021-07-15 NOTE — Telephone Encounter (Signed)
Spoke with Monica Irwin.  She states they currently do not need the FL-2 form as originally requested.  She did want to know if Dr. Lorelei Pont was okay taking over Monica Irwin's Namenda and Aricept management.  Just doesn't feel like there is anything else the neurologist can do at this point and doesn't want to keep going there for office visit if not needed.  No refills are needed at this time.  I advised that Dr. Lorelei Pont would be happy to take over refilling those medications for her.  Monica Irwin mentioned that a friend of hers mom is also being treated for Alzheimer and they have her on a low dose antidepressant.  She will find out from her friend the name of the medication but is wondering if this would be a good idea for Monica Irwin. She also thought that Monica Irwin could possible have a UTI.  Monica Irwin thinks her mom's urine has an abnormal smell to it.  Appointment scheduled for 07/17/2021 at 11:30 am with Dr. Glori Bickers to check urinalysis.  FYI to Dr. Lorelei Pont and Dr. Glori Bickers.

## 2021-07-16 ENCOUNTER — Other Ambulatory Visit: Payer: Self-pay

## 2021-07-16 ENCOUNTER — Encounter: Payer: Self-pay | Admitting: Family Medicine

## 2021-07-16 ENCOUNTER — Ambulatory Visit (INDEPENDENT_AMBULATORY_CARE_PROVIDER_SITE_OTHER): Payer: Medicare Other | Admitting: Family Medicine

## 2021-07-16 VITALS — BP 96/60 | HR 50 | Temp 97.3°F | Ht 64.0 in | Wt 131.0 lb

## 2021-07-16 DIAGNOSIS — Z111 Encounter for screening for respiratory tuberculosis: Secondary | ICD-10-CM

## 2021-07-16 DIAGNOSIS — R8290 Unspecified abnormal findings in urine: Secondary | ICD-10-CM | POA: Diagnosis not present

## 2021-07-16 DIAGNOSIS — N3090 Cystitis, unspecified without hematuria: Secondary | ICD-10-CM | POA: Diagnosis not present

## 2021-07-16 DIAGNOSIS — E039 Hypothyroidism, unspecified: Secondary | ICD-10-CM

## 2021-07-16 DIAGNOSIS — E038 Other specified hypothyroidism: Secondary | ICD-10-CM | POA: Diagnosis not present

## 2021-07-16 LAB — POC URINALSYSI DIPSTICK (AUTOMATED)
Bilirubin, UA: NEGATIVE
Glucose, UA: NEGATIVE
Ketones, UA: NEGATIVE
Nitrite, UA: POSITIVE
Protein, UA: POSITIVE — AB
Spec Grav, UA: 1.015 (ref 1.010–1.025)
Urobilinogen, UA: 0.2 E.U./dL
pH, UA: 6 (ref 5.0–8.0)

## 2021-07-16 MED ORDER — SULFAMETHOXAZOLE-TRIMETHOPRIM 800-160 MG PO TABS: 1.0000 | ORAL_TABLET | Freq: Two times a day (BID) | ORAL | 0 refills | Status: DC

## 2021-07-16 NOTE — Progress Notes (Signed)
Monica Irwin [name], MD, [place] at [facility] [place], [address]  Phone: [phone]   FAX: [place] - 76 y.o. female   MRN [id number]   Date of Birth: Jan 06, 1946  Date: 07/16/2021   PCP: Owens Loffler, MD   Referral: Owens Loffler, MD  Chief Complaint  Patient presents with   Abnormal Odor to Urine    This visit occurred during the SARS-CoV-2 public health emergency.  Safety protocols were in place, including screening questions prior to the visit, additional usage of staff PPE, and extensive cleaning of exam room while observing appropriate contact time as indicated for disinfecting solutions.   Subjective:   This 76 y.o. female patient presents with burning, urgency. No vaginal discharge or external irritation.  No STD exposure. No abd pain, no flank pain.  Thyroid: No symptoms. Labs reviewed. Denies cold / heat intolerance, dry skin, hair loss. No goiter.  Lab Results  Component Value Date   TSH 0.52 07/16/2021      [facility] paperwork  Review of Systems is noted in the HPI, as appropriate  Objective:   Blood pressure 96/60, pulse (!) 50, temperature (!) 97.3 F (36.3 C), temperature source Temporal, height 5\' 4"  (1.626 m), weight 131 lb (59.4 kg), SpO2 97 %.   GEN: WDWN HEENT: Atraumatc, normocephalic. CV: RRR, No M/G/R PULM: CTA B, No wheezes, crackles, or rhonchi ABD: S, NT, ND, +BS, no rebound. No CVAT. No suprapubic tenderness.  Objective Data: Results for orders placed or performed in visit on 07/16/21  Urine Culture   Specimen: Urine  Result Value Ref Range   MICRO NUMBER: [id number]    SPECIMEN QUALITY: Adequate    Sample Source NOT GIVEN    STATUS: FINAL    ISOLATE 1: Escherichia coli (A)       Susceptibility   Escherichia coli - URINE CULTURE, REFLEX    AMOX/CLAVULANIC >=32 Resistant     AMPICILLIN >=32 Resistant     AMPICILLIN/SULBACTAM >=32  Resistant     CEFAZOLIN* 16 Resistant      * For uncomplicated UTI caused by E. coli, K. pneumoniae or P. mirabilis: Cefazolin is susceptible if MIC <32 mcg/mL and predicts susceptible to the oral agents cefaclor, cefdinir, cefpodoxime, cefprozil, cefuroxime, cephalexin and loracarbef.     CEFTAZIDIME <=1 Sensitive     CEFEPIME <=1 Sensitive     CEFTRIAXONE <=1 Sensitive     CIPROFLOXACIN <=0.25 Sensitive     LEVOFLOXACIN <=0.12 Sensitive     GENTAMICIN <=1 Sensitive     IMIPENEM <=0.25 Sensitive     NITROFURANTOIN <=16 Sensitive     PIP/TAZO <=4 Sensitive     TOBRAMYCIN <=1 Sensitive     TRIMETH/SULFA* <=20 Sensitive      * For uncomplicated UTI caused by E. coli, K. pneumoniae or P. mirabilis: Cefazolin is susceptible if MIC <32 mcg/mL and predicts susceptible to the oral agents cefaclor, cefdinir, cefpodoxime, cefprozil, cefuroxime, cephalexin and loracarbef. Legend: S = Susceptible  I = Intermediate R = Resistant  NS = Not susceptible * = Not tested  NR = Not reported **NN = See antimicrobic comments   TSH  Result Value Ref Range   TSH 0.52 0.35 - 5.50 uIU/mL  T3, free  Result Value Ref Range   T3, Free 2.7 2.3 - 4.2 pg/mL  T4, free  Result Value Ref Range   Free T4 1.39 0.60 - 1.60 ng/dL  QuantiFERON-TB Gold  Plus  Result Value Ref Range   QuantiFERON-TB Gold Plus NEGATIVE NEGATIVE   NIL 0.03 IU/mL   Mitogen-NIL >10.00 IU/mL   TB1-NIL 0.00 IU/mL   TB2-NIL <0.00 IU/mL  POCT Urinalysis Dipstick (Automated)  Result Value Ref Range   Color, UA Yellow    Clarity, UA Cloudy    Glucose, UA Negative Negative   Bilirubin, UA Negative    Ketones, UA Negative    Spec Grav, UA 1.015 1.010 - 1.025   Blood, UA Moderate    pH, UA 6.0 5.0 - 8.0   Protein, UA Positive (A) Negative   Urobilinogen, UA 0.2 0.2 or 1.0 E.U./dL   Nitrite, UA Positive    Leukocytes, UA Large (3+) (A) Negative    Assessment and Plan:     ICD-10-CM   1. Cystitis  N30.90     2. Other  specified hypothyroidism  E03.8 TSH    T3, free    T4, free    3. Abnormal urine odor  R82.90 POCT Urinalysis Dipstick (Automated)    Urine Culture    4. Screening-pulmonary TB  Z11.1 QuantiFERON-TB Gold Plus    5. Hypothyroidism, adult  E03.9 T4, free    T3, free    TSH     Rx with ABX as below. Drink plenty of fluids and supportive care. E. coli UTI.  Thyroid is stable.  We also did some leg work in case she does need to go into assisted living.  TB screen is negative.  Follow-up: No follow-ups on file.  Meds ordered this encounter  Medications   sulfamethoxazole-trimethoprim (BACTRIM DS) 800-160 MG tablet    Sig: Take 1 tablet by mouth 2 (two) times daily.    Dispense:  6 tablet    Refill:  0   Orders Placed This Encounter  Procedures   Urine Culture   QuantiFERON-TB Gold Plus   T4, free   T3, free   TSH   POCT Urinalysis Dipstick (Automated)    Signed,  Indiah Heyden T. Venicia Vandall, MD   Patient's Medications  New Prescriptions   SULFAMETHOXAZOLE-TRIMETHOPRIM (BACTRIM DS) 800-160 MG TABLET    Take 1 tablet by mouth 2 (two) times daily.  Previous Medications   ALENDRONATE (FOSAMAX) 70 MG TABLET    TAKE 1 TABLET BY MOUTH EVERY 7 DAYS. TAKE WITH A FULL GLASS OF WATER ON AN EMPTY STOMACH.   CALCIUM CARBONATE-VITAMIN D (CALTRATE 600+D PO)    Take 2 tablets by mouth daily.   DONEPEZIL (ARICEPT) 10 MG TABLET    Take one tab at bedtime.   ESTRADIOL (ESTRACE) 0.1 MG/GM VAGINAL CREAM    Use 1 gram per vagina with pessary care in the office.   FLUOCINONIDE OINTMENT (LIDEX) 0.05 %    Apply 1 application topically 2 (two) times daily.   IBUPROFEN (ADVIL,MOTRIN) 200 MG TABLET    Take 400 mg by mouth daily as needed for moderate pain.   LEVOTHYROXINE (SYNTHROID) 100 MCG TABLET    1 tablet 30 minutes before breakfast   MEMANTINE (NAMENDA) 10 MG TABLET    TAKE 1 TABLET BY MOUTH 2 TIMES DAILY.   ROSUVASTATIN (CRESTOR) 10 MG TABLET    TAKE 1 TABLET BY MOUTH DAILY.  Modified  Medications   No medications on file  Discontinued Medications   No medications on file

## 2021-07-17 ENCOUNTER — Ambulatory Visit: Payer: Medicare Other | Admitting: Family Medicine

## 2021-07-17 ENCOUNTER — Telehealth: Payer: Self-pay | Admitting: *Deleted

## 2021-07-17 LAB — T3, FREE: T3, Free: 2.7 pg/mL (ref 2.3–4.2)

## 2021-07-17 LAB — T4, FREE: Free T4: 1.39 ng/dL (ref 0.60–1.60)

## 2021-07-17 LAB — TSH: TSH: 0.52 u[IU]/mL (ref 0.35–5.50)

## 2021-07-17 NOTE — Telephone Encounter (Signed)
Monica Irwin is requesting a letter from Dr. Lorelei Pont to have [name]'s mailbox moved closer to her house.  Please mail letter when ready Attn: [name].

## 2021-07-18 LAB — QUANTIFERON-TB GOLD PLUS
Mitogen-NIL: >10 IU/mL
NIL: 0.03 IU/mL
QuantiFERON-TB Gold Plus: NEGATIVE
TB1-NIL: 0 IU/mL
TB2-NIL: <0 IU/mL

## 2021-07-18 LAB — URINE CULTURE
MICRO NUMBER:: 12826495
SPECIMEN QUALITY:: ADEQUATE

## 2021-07-20 ENCOUNTER — Encounter: Payer: Self-pay | Admitting: Family Medicine

## 2021-07-24 ENCOUNTER — Encounter: Payer: Self-pay | Admitting: Family Medicine

## 2021-07-24 NOTE — Telephone Encounter (Signed)
Letter mailed to Ms. Tally with ATTN: [name]

## 2021-07-24 NOTE — Telephone Encounter (Signed)
done

## 2021-08-11 ENCOUNTER — Other Ambulatory Visit: Payer: Self-pay | Admitting: Family Medicine

## 2021-09-02 ENCOUNTER — Telehealth: Payer: Self-pay | Admitting: Family Medicine

## 2021-09-02 NOTE — Telephone Encounter (Signed)
LVM for pt to rtn my call to schedule AWV with [facility]. Please schedule if pt calls the office.  ?

## 2021-10-13 ENCOUNTER — Ambulatory Visit (INDEPENDENT_AMBULATORY_CARE_PROVIDER_SITE_OTHER): Payer: Medicare Other | Admitting: Podiatrist

## 2021-10-13 ENCOUNTER — Encounter: Payer: Self-pay | Admitting: Podiatrist

## 2021-10-13 DIAGNOSIS — M79609 Pain in unspecified limb: Secondary | ICD-10-CM | POA: Diagnosis not present

## 2021-10-13 DIAGNOSIS — B351 Tinea unguium: Secondary | ICD-10-CM | POA: Diagnosis not present

## 2021-10-13 DIAGNOSIS — L602 Onychogryphosis: Secondary | ICD-10-CM

## 2021-10-13 NOTE — Patient Instructions (Signed)

## 2021-10-13 NOTE — Progress Notes (Addendum)
Subjective: ?Monica Irwin is a 76 y.o. female patient who presents to office today complaining of long,mildly painful nails  while ambulating in shoes; unable to trim. Patient denies any cramping, numbness, burning or tingling in the legs.  She presents with her niece, Jeannene Patella, today.  ? ? ?Patient Active Problem List  ? Diagnosis Date Noted  ? Dementia ([place]) 06/10/2020  ? Primary osteoarthritis of right hip 08/19/2017  ? History of colonic polyps 10/15/2015  ? 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 Aortic calcification ([place]) 04/22/2015  ? Major depressive disorder, recurrent episode, in full remission ([place]) 07/24/2010  ? OSTEOPOROSIS 07/24/2010  ? INSOMNIA 08/29/2009  ? Hyperlipidemia with target LDL less than 100 12/19/2008  ? Hypothyroidism 07/02/2008  ? OSTEOARTHRITIS 07/02/2008  ? NEPHROLITHIASIS, HX OF 07/02/2008  ? ?Current Outpatient Medications on File Prior to Visit  ?Medication Sig Dispense Refill  ? alendronate (FOSAMAX) 70 MG tablet TAKE 1 TABLET BY MOUTH EVERY 7 DAYS. TAKE WITH A FULL GLASS OF WATER ON AN EMPTY STOMACH. 12 tablet 3  ? Calcium Carbonate-Vitamin D (CALTRATE 600+D PO) Take 2 tablets by mouth daily.    ? donepezil (ARICEPT) 10 MG tablet TAKE ONE TAB AT BEDTIME. 90 tablet 3  ? estradiol (ESTRACE) 0.1 MG/GM vaginal cream Use 1 gram per vagina with pessary care in the office. 42.5 g 0  ? fluocinonide ointment (LIDEX) 8.67 % Apply 1 application topically 2 (two) times daily. 60 g 1  ? ibuprofen (ADVIL,MOTRIN) 200 MG tablet Take 400 mg by mouth daily as needed for moderate pain.    ? levothyroxine (SYNTHROID) 100 MCG tablet 1 tablet 30 minutes before breakfast 90 tablet 3  ? memantine (NAMENDA) 10 MG tablet TAKE 1 TABLET BY MOUTH 2 TIMES DAILY. 180 tablet 3  ? rosuvastatin (CRESTOR) 10 MG tablet TAKE 1 TABLET BY MOUTH DAILY. 90 tablet 3  ? sulfamethoxazole-trimethoprim (BACTRIM DS) 800-160 MG tablet Take 1 tablet by mouth 2 (two) times daily. 6 tablet 0  ? ?No current  facility-administered medications on file prior to visit.  ? ?No Known Allergies ? ? ? ?Objective: ?General: Patient is awake, alert, and oriented x 3 and in no acute distress. ? ?Integument: Skin is warm, dry and supple bilateral. Nails are tender, long, thickened and  ?dystrophic with subungual debris, consistent with onychomycosis, 1-5 bilateral. No signs of infection. No open lesions or preulcerative lesions present bilateral. Remaining integument unremarkable. ? ?Vasculature:  Dorsalis Pedis pulse 1/4 bilateral. Posterior Tibial pulse  1/4 bilateral.  ?Capillary fill time <3 sec 1-5 bilateral. Positive hair growth to the level of the digits. ?Temperature gradient within normal limits. No varicosities present bilateral. No edema present bilateral.  ? ?Neurology: The patient has intact sensation measured with a 5.07/10g Semmes Weinstein Monofilament at all pedal sites bilateral . Vibratory sensation diminished bilateral with tuning fork. No Babinski sign present bilateral.  ? ?Musculoskeletal: No symptomatic pedal deformities noted bilateral. Muscular strength 5/5 in all lower extremity muscular groups bilateral without pain on range of motion . No tenderness with calf compression bilateral. ? ?Assessment and Plan: ?  ICD-10-CM   ?1. Long toenail  L60.2   ?  ?2. Pain due to onychomycosis of nail  B35.1   ? M79.609   ?  ? ? ? ? ?-Examined patient. ?-Discussed and educated patient general foot care considerations.  ?-Mechanically debrided all nails 1-5 bilateral using sterile nail nipper and filed with dremel without incident  ?-Answered all patient questions ?-Patient to  return  in 3 months for at continued foot care.  ?-Patient advised to call the office if any problems or questions arise in the meantime. ? ?Bronson Ing, DPM ?

## 2021-10-14 ENCOUNTER — Encounter: Payer: Self-pay | Admitting: Family Medicine

## 2021-10-14 DIAGNOSIS — Z111 Encounter for screening for respiratory tuberculosis: Secondary | ICD-10-CM

## 2021-10-15 ENCOUNTER — Ambulatory Visit (INDEPENDENT_AMBULATORY_CARE_PROVIDER_SITE_OTHER)
Admission: RE | Admit: 2021-10-15 | Discharge: 2021-10-15 | Disposition: A | Discharge status: Home / Self Care | Payer: Medicare Other | Source: Ambulatory Visit | Attending: Family Medicine | Admitting: Family Medicine

## 2021-10-15 ENCOUNTER — Ambulatory Visit (INDEPENDENT_AMBULATORY_CARE_PROVIDER_SITE_OTHER): Payer: Medicare Other | Admitting: Family Medicine

## 2021-10-15 VITALS — BP 100/62 | HR 61 | Temp 98.0°F | Ht 64.0 in

## 2021-10-15 DIAGNOSIS — R5383 Other fatigue: Secondary | ICD-10-CM

## 2021-10-15 DIAGNOSIS — R051 Acute cough: Secondary | ICD-10-CM

## 2021-10-15 DIAGNOSIS — R062 Wheezing: Secondary | ICD-10-CM

## 2021-10-15 DIAGNOSIS — F0390 Unspecified dementia without behavioral disturbance: Secondary | ICD-10-CM

## 2021-10-15 DIAGNOSIS — R0981 Nasal congestion: Secondary | ICD-10-CM

## 2021-10-15 DIAGNOSIS — J189 Pneumonia, unspecified organism: Secondary | ICD-10-CM | POA: Diagnosis not present

## 2021-10-15 DIAGNOSIS — Z111 Encounter for screening for respiratory tuberculosis: Secondary | ICD-10-CM

## 2021-10-15 LAB — POC INFLUENZA A&B (BINAX/QUICKVUE)
Influenza A, POC: NEGATIVE
Influenza B, POC: NEGATIVE

## 2021-10-15 LAB — POC COVID19 BINAXNOW: SARS Coronavirus 2 Ag: NEGATIVE

## 2021-10-15 MED ORDER — ALBUTEROL SULFATE HFA 108 (90 BASE) MCG/ACT IN AERS: 2.0000 | INHALATION_SPRAY | Freq: Four times a day (QID) | RESPIRATORY_TRACT | 0 refills | Status: DC | PRN

## 2021-10-15 MED ORDER — LEVOFLOXACIN 500 MG PO TABS: 500.0000 mg | ORAL_TABLET | Freq: Every day | ORAL | 0 refills | Status: DC

## 2021-10-15 NOTE — Telephone Encounter (Signed)
Please see my office notes.  I was asked to see the patient emergently. ?

## 2021-10-15 NOTE — Telephone Encounter (Signed)
Pt daughter called upset and begging for Dr Edilia Bo to squeeze Monica Irwin in today. She is not feeling well. I offered tomorrow's appts... but she can't do that.  ?

## 2021-10-15 NOTE — Telephone Encounter (Signed)
Can you help triage this situation?  Will be pretty difficult for me to stay late tonight due to my own personal family obligations. ?

## 2021-10-15 NOTE — Progress Notes (Signed)
? ? ?Casmir Auguste T. Artelia Game, MD, [facility] ?Therapist, music at [facility] ?[place] ?[place], [address] ? ?Phone: [phone]  FAX: [phone] ? ?Monica Irwin - 76 y.o. female  MRN [id number]  Date of Birth: 1946-02-14 ? ?Date: 10/15/2021  PCP: Owens Loffler, MD  Referral: Owens Loffler, MD ? ?Chief Complaint  ?Patient presents with  ? Cough  ?  Started on Saturday  ? Shortness of Breath  ? Nasal Congestion  ? Wheezing  ? ? ?This visit occurred during the SARS-CoV-2 public health emergency.  Safety protocols were in place, including screening questions prior to the visit, additional usage of staff PPE, and extensive cleaning of exam room while observing appropriate contact time as indicated for disinfecting solutions.  ? ?Subjective:  ? ?Monica Irwin is a 76 y.o. very pleasant female patient with Body mass index is 22.49 kg/m?. who presents with the following: ? ?The patient's daughter brought her to the parking lot, and I was asked to emergently evaluate the patient in the parking lot.  Her daughter was tearful, noted that she was quite sick and was short of breath.  At the time of my evaluation in the parking lot, the patient looked to be tachypneic, was coughing the entire time and was obviously wheezing.  Daughter asked to activate EMS, EMS was activated.  I stayed until I could give report to the first responders. ? ?Ultimately, they recommended the patient stay and did not need EMS transport. ? ?I was then asked to emergently see the patient in worker and urgently in the office.  She was evaluated face-to-face, and her daughter offered additional history. ? ?Since Saturday, she has had a cough, had shortness of breath as well as wheezing.  They do not think that she has had a fever, but she has had chills, a lot of aching.  She does have a profuse productive cough.  She does not have a history of smoking.  No history of COPD or asthma. ? ?She has  been afebrile with stable vital signs and her pulse ox is 97% while she was here in the examination room. ? ?She also has had some significant loss of memory of recent years, and this has become progressive.  She is been quite challenging for her family. ? ?She also has a great deal of nasal congestion. ?She otherwise does have minimal sore throat, no significant ear pain, abdominal pain, GI complaints or dysuria. ? ? ? ?Immunization History  ?Administered Date(s) Administered  ? Fluad Quad(high Dose 65+) 04/06/2019, 03/19/2021  ? Influenza Whole 04/10/2009, 03/13/2010  ? Influenza, High Dose Seasonal PF 04/01/2018  ? Influenza,inj,Quad PF,6+ Mos 04/22/2015, 03/30/2016, 05/24/2017  ? Influenza-Unspecified 04/12/2013, 04/12/2017  ? PFIZER(Purple Top)SARS-COV-2 Vaccination 08/01/2019, 08/21/2019, 05/26/2020  ? Pneumococcal Conjugate-13 11/08/2013  ? Pneumococcal Polysaccharide-23 05/22/2010  ? Td 07/13/2001  ? Tdap 01/20/2017  ? Zoster Recombinat (Shingrix) 02/15/2019, 04/24/2019  ? Zoster, Live 07/13/2001  ?  ? ?Review of Systems is noted in the HPI, as appropriate ? ?Objective:  ? ?BP 100/62   Pulse 61   Temp 98 ?F (36.7 ?C) (Oral)   Ht '5\' 4"'$  (1.626 m)   SpO2 97%   BMI 22.49 kg/m?  ? ? ?Gen: WDWN, NAD. Globally Non-toxic ?HEENT: Throat clear, w/o exudate, R TM clear, L TM - good landmarks, No fluid present. rhinnorhea.  MMM ?Frontal sinuses: NT ?Max sinuses: NT ?NECK: Anterior cervical  LAD is absent ?CV: RRR, No M/G/R, cap refill <  2 sec ?PULM: Breathing comfortably in no respiratory distress.  Diffuse wheezing.  She does have some rhonchorous sounds in bilateral lung fields. ? ?Laboratory and Imaging Data: ?Results for orders placed or performed in visit on 10/15/21  ?[place] COVID-19  ?Result Value Ref Range  ? SARS Coronavirus 2 Ag Negative Negative  ?POC Influenza A&B (Binax test)  ?Result Value Ref Range  ? Influenza A, POC Negative Negative  ? Influenza B, POC Negative Negative  ?  ?No results found.   ? ?Assessment and Plan:  ? ?  ICD-10-CM   ?1. Community acquired pneumonia of right middle lobe of lung  J18.9   ?  ?2. Acute cough  R05.1 DG Chest 2 View  ?  [place] COVID-19  ?  POC Influenza A&B (Binax test)  ?  ?3. Other fatigue  R53.83 DG Chest 2 View  ?  ?4. Wheezing  R06.2 DG Chest 2 View  ?  ?5. Nasal congestion  R09.81 DG Chest 2 View  ?  ?6. Screening for tuberculosis  Z11.1 QuantiFERON-TB Gold Plus  ?  CANCELED: QuantiFERON-TB Gold Plus  ?  ?7. Dementia without behavioral disturbance ([place])  F03.90   ?  ? ?Total encounter time: 70 minutes. This includes total time spent on the day of encounter.   Much of this time was spent in the urgent face-to-face assessment in the parking lot.  Communication with family.  Direct communication with first responders.  At that point I assisted our staff to have the patient urgently transferred to our office rooms where I completed a ASAP face-to-face appointment.  Additional time was also spent in ASAP completion of FL 2 forms on the day of the encounter.  This was all completed to the best of my ability. ? ?I do think that the patient has community-acquired pneumonia.  Concern for right-sided pneumonia. ? ?I am gonna placed the patient on some levofloxacin and give her some Ventolin to take. ? ?Following day, the patient's daughter also brought her to the lobby, and there was a request for a QUANT TB test.  We will provide this for the additional FL 2 documentation, which is already been completed and is available to the family. ? ?The patient continues to have some dementia, and this has been progressive.  His also the family also had a recent stroke, the patient's daughter's husband had a stroke.  This is causing some increased challenges from a care standpoint.  They are going to try to get her into a respite center in the time being. ? ?Meds ordered this encounter  ?Medications  ? levofloxacin (LEVAQUIN) 500 MG tablet  ?  Sig: Take 1 tablet (500 mg total) by mouth  daily.  ?  Dispense:  7 tablet  ?  Refill:  0  ? albuterol (VENTOLIN HFA) 108 (90 Base) MCG/ACT inhaler  ?  Sig: Inhale 2 puffs into the lungs every 6 (six) hours as needed for wheezing or shortness of breath.  ?  Dispense:  8 g  ?  Refill:  0  ? ?Medications Discontinued During This Encounter  ?Medication Reason  ? sulfamethoxazole-trimethoprim (BACTRIM DS) 800-160 MG tablet Completed Course  ? ?Orders Placed This Encounter  ?Procedures  ? DG Chest 2 View  ? [place] COVID-19  ? POC Influenza A&B (Binax test)  ? ? ?Follow-up: No follow-ups on file. ? ?Dragon Medical One speech-to-text software was used for transcription in this dictation.  Possible transcriptional errors can occur using Editor, commissioning.  ? ?  Signed, ? ?Francille Wittmann T. Kirbie Stodghill, MD ? ? ?Outpatient Encounter Medications as of 10/15/2021  ?Medication Sig  ? albuterol (VENTOLIN HFA) 108 (90 Base) MCG/ACT inhaler Inhale 2 puffs into the lungs every 6 (six) hours as needed for wheezing or shortness of breath.  ? alendronate (FOSAMAX) 70 MG tablet TAKE 1 TABLET BY MOUTH EVERY 7 DAYS. TAKE WITH A FULL GLASS OF WATER ON AN EMPTY STOMACH.  ? Calcium Carbonate-Vitamin D (CALTRATE 600+D PO) Take 2 tablets by mouth daily.  ? donepezil (ARICEPT) 10 MG tablet TAKE ONE TAB AT BEDTIME.  ? estradiol (ESTRACE) 0.1 MG/GM vaginal cream Use 1 gram per vagina with pessary care in the office.  ? fluocinonide ointment (LIDEX) 0.10 % Apply 1 application topically 2 (two) times daily.  ? ibuprofen (ADVIL,MOTRIN) 200 MG tablet Take 400 mg by mouth daily as needed for moderate pain.  ? levofloxacin (LEVAQUIN) 500 MG tablet Take 1 tablet (500 mg total) by mouth daily.  ? levothyroxine (SYNTHROID) 100 MCG tablet 1 tablet 30 minutes before breakfast  ? memantine (NAMENDA) 10 MG tablet TAKE 1 TABLET BY MOUTH 2 TIMES DAILY.  ? rosuvastatin (CRESTOR) 10 MG tablet TAKE 1 TABLET BY MOUTH DAILY.  ? [DISCONTINUED] sulfamethoxazole-trimethoprim (BACTRIM DS) 800-160 MG tablet Take 1 tablet by mouth 2  (two) times daily.  ? ?No facility-administered encounter medications on file as of 10/15/2021.  ?  ?

## 2021-10-15 NOTE — Telephone Encounter (Signed)
Patient's daughter came to the office to drop off some paperwork. Patient's daughter stated that her mom is in the car and is very sick. Patient's daughter stated that her mom has had congestion for several days. Patient's daughter stated that her mom is wheezing and having difficulty breathing now. Advised patient's daughter if her mom is having SOB and difficulty breathing she should go to the ER to be evaluated. Patient's daughter started crying stating that her husband just had a stroke and she can not take her to the ER and she would just call 911. Patient's daughter was asked if she wanted [place] to call EMS and she stated yes. Patient's mom was in the car so her daughter went back to the car and 911 was called. Dr. Lorelei Pont went out to the car to evaluate the patient. The fire department and EMS came. ?

## 2021-10-16 ENCOUNTER — Encounter: Payer: Self-pay | Admitting: Family Medicine

## 2021-10-16 ENCOUNTER — Telehealth: Payer: Self-pay

## 2021-10-16 NOTE — Telephone Encounter (Signed)
Thank-you for assisting.  Reviewed in a different note. ?

## 2021-10-16 NOTE — Addendum Note (Signed)
Addended by: Carter Kitten on: 10/16/2021 12:22 PM ? ? Modules accepted: Orders ? ?

## 2021-10-16 NOTE — Telephone Encounter (Signed)
Ok. Fine. ?

## 2021-10-16 NOTE — Telephone Encounter (Signed)
Daughter would like call back once approval received so we can make what every appointment we need to make.  ?

## 2021-10-16 NOTE — Telephone Encounter (Signed)
Daughter called office received message that [facility] is ready for pick up. She states she forgot that she needs tb test as well. She is going to call facility and see if she needs skin test or if ppd will work. Is it ok  to place order for what she needs when she calls back to let us know? Made aware that if it is the ppd skin test we will have to do next week but if Quant gold is ok we can get in today.  ?

## 2021-10-19 LAB — QUANTIFERON-TB GOLD PLUS
Mitogen-NIL: 8.23 IU/mL
NIL: 0.07 IU/mL
QuantiFERON-TB Gold Plus: NEGATIVE
TB1-NIL: <0 IU/mL
TB2-NIL: <0 IU/mL

## 2021-10-20 ENCOUNTER — Encounter: Payer: Self-pay | Admitting: Family Medicine

## 2021-11-08 ENCOUNTER — Encounter: Payer: Self-pay | Admitting: Family Medicine

## 2021-11-10 ENCOUNTER — Encounter: Payer: Self-pay | Admitting: Family

## 2021-11-10 ENCOUNTER — Ambulatory Visit (INDEPENDENT_AMBULATORY_CARE_PROVIDER_SITE_OTHER): Payer: Medicare Other | Admitting: Family

## 2021-11-10 ENCOUNTER — Encounter: Payer: Self-pay | Admitting: Obstetrics and Gynecology

## 2021-11-10 VITALS — BP 108/68 | HR 55 | Temp 98.0°F | Resp 16 | Ht 64.0 in | Wt 135.6 lb

## 2021-11-10 DIAGNOSIS — R300 Dysuria: Secondary | ICD-10-CM | POA: Diagnosis not present

## 2021-11-10 DIAGNOSIS — N3001 Acute cystitis with hematuria: Secondary | ICD-10-CM | POA: Diagnosis not present

## 2021-11-10 LAB — POC URINALSYSI DIPSTICK (AUTOMATED)
Bilirubin, UA: NEGATIVE
Glucose, UA: NEGATIVE
Ketones, UA: NEGATIVE
Nitrite, UA: NEGATIVE
Protein, UA: POSITIVE — AB
Spec Grav, UA: 1.015
Urobilinogen, UA: 0.2 U/dL
pH, UA: 6.5

## 2021-11-10 MED ORDER — SULFAMETHOXAZOLE-TRIMETHOPRIM 800-160 MG PO TABS: 1.0000 | ORAL_TABLET | Freq: Two times a day (BID) | ORAL | 0 refills | Status: AC

## 2021-11-10 NOTE — Progress Notes (Signed)
? ?Established Patient Office Visit ? ?Subjective:  ?Patient ID: Monica Irwin, female    DOB: [birth year]  Age: 76 y.o. MRN: [id number] ? ?CC:  ?Chief Complaint  ?Patient presents with  ? Urinary Frequency  ?  X 1 month  ? ? ?HPI ?Monica Irwin is here today with concerns.  ? ?Three days ago started with urinary frequency, and increased confusion more than normal. She is accompanied by her daughter. A little bit of dysuria when peeing. Also with increased urinary urgency. Does wear a pad during the day.  ?No fever no chills.  ? ?Does have pessary in place.  ? ?Did finish up levaquin about three weeks ago/had pneumonia.  ? ?Past Medical History:  ?Diagnosis Date  ? ASCUS of cervix with negative high risk HPV 05/2018  ? Repeat Pap smear one year interval  ? 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 S/p successful ablation 07/2015.  ? Dementia without behavioral disturbance ([place]) 06/10/2020  ? Dysrhythmia   ? hx of SVT - 2017   ? History of colonic polyps   ? History of kidney stones   ? Hypothyroidism   ? Liver cyst   ? Memory loss   ? Nephrolithiasis   ? Osteoporosis   ? ? ?Past Surgical History:  ?Procedure Laterality Date  ? ABLATION    ? BACK SURGERY    ? [phone]  ? ELECTROPHYSIOLOGIC STUDY N/A 07/22/2015  ? Procedure: SVT Ablation;  Surgeon: Will Meredith Leeds, MD;  Location: [facility];  Service: Cardiovascular;  Laterality: N/A;  ? PARTIAL COLECTOMY  2005  ? SMALL INTESTINE SURGERY    ? THYROIDECTOMY  2008  ? TOTAL HIP ARTHROPLASTY Right 08/19/2017  ? Procedure: RIGHT TOTAL HIP ARTHROPLASTY ANTERIOR APPROACH;  Surgeon: Rod Can, MD;  Location: [facility];  Service: Orthopedics;  Laterality: Right;  ? ? ?Family History  ?Problem Relation Age of Onset  ? Stroke Mother   ? Alzheimer's disease Mother   ? Heart failure Brother   ? Alzheimer's disease Sister   ? Other Father   ?     natural causes  ? Heart attack Brother   ? Colon cancer Other   ?     nephew  ?  Ulcerative colitis Sister   ?     colon removed  ? ? ?Social History  ? ?Socioeconomic History  ? Marital status: Divorced  ?  Spouse name: Not on file  ? Number of children: 1  ? Years of education: HS  ? Highest education level: Not on file  ?Occupational History  ? Occupation: retired [employer]  ?  Employer: RETIRED  ?Tobacco Use  ? Smoking status: Never  ? Smokeless tobacco: Never  ?Vaping Use  ? Vaping Use: Never used  ?Substance and Sexual Activity  ? Alcohol use: No  ?  Alcohol/week: 0.0 standard drinks  ? Drug use: No  ? Sexual activity: Never  ?Other Topics Concern  ? Not on file  ?Social History Narrative  ? Regular exercise--no  ? Lives at home alone.  ? Right-handed.  ? 1 cup caffeine daily.  ?   ?   ?   ? Epworth Sleepiness Scale  ?   ? Total: 4  ?   ?   ?   ?   ? ?Social Determinants of Health  ? ?Financial Resource Strain: Not on file  ?Food Insecurity: Not on file  ?Transportation Needs: Not on file  ?Physical  Activity: Not on file  ?Stress: Not on file  ?Social Connections: Not on file  ?Intimate Partner Violence: Not on file  ? ? ?Outpatient Medications Prior to Visit  ?Medication Sig Dispense Refill  ? alendronate (FOSAMAX) 70 MG tablet TAKE 1 TABLET BY MOUTH EVERY 7 DAYS. TAKE WITH A FULL GLASS OF WATER ON AN EMPTY STOMACH. 12 tablet 3  ? Calcium Carbonate-Vitamin D (CALTRATE 600+D PO) Take 2 tablets by mouth daily.    ? donepezil (ARICEPT) 10 MG tablet TAKE ONE TAB AT BEDTIME. 90 tablet 3  ? estradiol (ESTRACE) 0.1 MG/GM vaginal cream Use 1 gram per vagina with pessary care in the office. 42.5 g 0  ? fluocinonide ointment (LIDEX) 3.87 % Apply 1 application topically 2 (two) times daily. 60 g 1  ? fluticasone (FLONASE) 50 MCG/ACT nasal spray Place into both nostrils daily.    ? ibuprofen (ADVIL,MOTRIN) 200 MG tablet Take 400 mg by mouth daily as needed for moderate pain.    ? levothyroxine (SYNTHROID) 100 MCG tablet 1 tablet 30 minutes before breakfast 90 tablet 3  ?  memantine (NAMENDA) 10 MG tablet TAKE 1 TABLET BY MOUTH 2 TIMES DAILY. 180 tablet 3  ? rosuvastatin (CRESTOR) 10 MG tablet TAKE 1 TABLET BY MOUTH DAILY. 90 tablet 3  ? albuterol (VENTOLIN HFA) 108 (90 Base) MCG/ACT inhaler Inhale 2 puffs into the lungs every 6 (six) hours as needed for wheezing or shortness of breath. (Patient not taking: Reported on 11/10/2021) 8 g 0  ? levofloxacin (LEVAQUIN) 500 MG tablet Take 1 tablet (500 mg total) by mouth daily. (Patient not taking: Reported on 11/10/2021) 7 tablet 0  ? ?No facility-administered medications prior to visit.  ? ? ?No Known Allergies ? ?ROS ?Review of Systems  ?Constitutional:  Negative for chills and fever.  ?Gastrointestinal:  Negative for abdominal pain.  ?Genitourinary:  Positive for dysuria, frequency and urgency. Negative for difficulty urinating, flank pain, hematuria, pelvic pain and vaginal discharge.  ?Musculoskeletal:  Positive for arthralgias.  ?Psychiatric/Behavioral:  Positive for confusion (baseline).   ? ?  ?Objective:  ?  ?Physical Exam ?Constitutional:   ?   General: She is not in acute distress. ?   Appearance: Normal appearance. She is well-developed, well-groomed and normal weight. She is not ill-appearing, toxic-appearing or diaphoretic.  ?HENT:  ?   Nose: No congestion or rhinorrhea.  ?   Mouth/Throat:  ?   Pharynx: No pharyngeal swelling, oropharyngeal exudate or posterior oropharyngeal erythema.  ?   Tonsils: No tonsillar exudate.  ?Neck:  ?   Thyroid: No thyroid mass.  ?Abdominal:  ?   Tenderness: There is no abdominal tenderness.  ?Musculoskeletal:  ?   Lumbar back: Normal. No tenderness.  ?Lymphadenopathy:  ?   Cervical:  ?   Right cervical: No superficial cervical adenopathy. ?   Left cervical: No superficial cervical adenopathy.  ?Neurological:  ?   Mental Status: She is alert. Mental status is at baseline. She is disoriented.  ?   Comments: Disorientation is baseline  ?Psychiatric:     ?   Mood and Affect: Mood normal.     ?    Behavior: Behavior normal.  ? ? ?BP 108/68   Pulse (!) 55   Temp 98 ?F (36.7 ?C)   Resp 16   Ht '5\' 4"'$  (1.626 m)   Wt 135 lb 9 oz (61.5 kg)   SpO2 97%   BMI 23.27 kg/m?  ?Wt Readings from Last 3 Encounters:  ?11/10/21 135  lb 9 oz (61.5 kg)  ?07/16/21 131 lb (59.4 kg)  ?05/27/21 146 lb (66.2 kg)  ? ? ? ?Health Maintenance Due  ?Topic Date Due  ? COVID-19 Vaccine (4 - Booster for Pfizer series) 07/21/2020  ? COLONOSCOPY (Pts 45-63yr Insurance coverage will need to be confirmed)  10/29/2020  ? ? ?There are no preventive care reminders to display for this patient. ? ?Lab Results  ?Component Value Date  ? TSH 0.52 07/16/2021  ? ?Lab Results  ?Component Value Date  ? WBC 5.0 03/12/2021  ? HGB 14.9 03/12/2021  ? HCT 45.6 03/12/2021  ? MCV 94.7 03/12/2021  ? PLT 238.0 03/12/2021  ? ?Lab Results  ?Component Value Date  ? NA 141 03/12/2021  ? K 4.5 03/12/2021  ? CO2 29 03/12/2021  ? GLUCOSE 88 03/12/2021  ? BUN 18 03/12/2021  ? CREATININE 0.92 03/12/2021  ? BILITOT 0.4 03/12/2021  ? ALKPHOS 56 03/12/2021  ? AST 22 03/12/2021  ? ALT 28 03/12/2021  ? PROT 6.1 03/12/2021  ? ALBUMIN 3.8 03/12/2021  ? CALCIUM 9.0 03/12/2021  ? ANIONGAP 7 08/20/2017  ? GFR 60.16 03/12/2021  ? ?Lab Results  ?Component Value Date  ? HGBA1C 6.4 03/12/2021  ? ? ?  ?Assessment & Plan:  ? ?Problem List Items Addressed This Visit   ? ?  ? Genitourinary  ? Acute cystitis with hematuria  ?  antbx sent to pharmacy, pt to take as directed. Encouraged increased water intake throughout the day. Urine culture/reflex pending results. Choosing to treat due to being symptomatic. If no improvement in the next 2 days pt advised to let me know. ? ?F/u with gyn/urology for pessary and check up as scheduled.  ? ? ?  ?  ? Relevant Medications  ? sulfamethoxazole-trimethoprim (BACTRIM DS) 800-160 MG tablet  ?  ? Other  ? Dysuria - Primary  ?  poct urine dipstick, reviewed choosing to treat with hematuria, proteinuria and leukocytes in urine ?Urine culture ordered  pending results ? ?  ?  ? Relevant Orders  ? Urine Culture  ? POCT Urinalysis Dipstick (Automated) (Completed)  ? ? ?Meds ordered this encounter  ?Medications  ? sulfamethoxazole-trimethoprim (BACTRIM DS) 800-160 MG tablet

## 2021-11-10 NOTE — Patient Instructions (Signed)
It was a pleasure seeing you today.   You were found to have a urinary tract infection, you have been prescribed an antibiotic to your preferred pharmacy. Please start antibiotic today as directed.   We are sending your urine for a culture to make sure you do not have a resistant bacteria. We will call you if we need to change your medications.   Please make sure you are drinking plenty of fluids over the next few days.  If your symptoms do not improve over the next 5-7 days, or if they worsen, please let us know. Please also let us know if you have worsening back pain, fevers, chills, or body aches.   Regards,   Josselyn Harkins  

## 2021-11-10 NOTE — Telephone Encounter (Signed)
Pt's daughter, Jackelyn Poling, called this morning asking what Dr Lillie Fragmin plan was. I advised he has not seen the message yet. I made her an OV with Tabitha Dugal this morning at 1140. Debbie asked if Dr Lorelei Pont thinks they should do something different to please call her at [phone]. She asked that I sent this to [facility] for her to follow-up with Dr Lorelei Pont. ?

## 2021-11-11 DIAGNOSIS — N3001 Acute cystitis with hematuria: Secondary | ICD-10-CM | POA: Insufficient documentation

## 2021-11-11 NOTE — Assessment & Plan Note (Signed)
poct urine dipstick, reviewed choosing to treat with hematuria, proteinuria and leukocytes in urine ?Urine culture ordered pending results ?

## 2021-11-11 NOTE — Assessment & Plan Note (Signed)
antbx sent to pharmacy, pt to take as directed. Encouraged increased water intake throughout the day. Urine culture/reflex pending results. Choosing to treat due to being symptomatic. If no improvement in the next 2 days pt advised to let me know. ? ?F/u with gyn/urology for pessary and check up as scheduled.  ? ?

## 2021-11-12 NOTE — Progress Notes (Signed)
Pending susceptibility report

## 2021-11-13 LAB — URINE CULTURE
MICRO NUMBER:: 13333531
SPECIMEN QUALITY:: ADEQUATE

## 2021-11-20 ENCOUNTER — Ambulatory Visit (INDEPENDENT_AMBULATORY_CARE_PROVIDER_SITE_OTHER): Payer: Medicare Other | Admitting: Obstetrics and Gynecology

## 2021-11-20 ENCOUNTER — Encounter: Payer: Self-pay | Admitting: Obstetrics and Gynecology

## 2021-11-20 VITALS — BP 130/74 | HR 55 | Ht 64.5 in | Wt 146.0 lb

## 2021-11-20 DIAGNOSIS — Z87440 Personal history of urinary (tract) infections: Secondary | ICD-10-CM

## 2021-11-20 DIAGNOSIS — Z4689 Encounter for fitting and adjustment of other specified devices: Secondary | ICD-10-CM | POA: Diagnosis not present

## 2021-11-20 DIAGNOSIS — N8189 Other female genital prolapse: Secondary | ICD-10-CM | POA: Diagnosis not present

## 2021-11-20 NOTE — Progress Notes (Signed)
GYNECOLOGY  VISIT ?  ?HPI: ?76 y.o.   Divorced  Caucasian  female   ?G1P1 with No LMP recorded. Patient is postmenopausal.   ?here for pessary check. ? ?Doing well with pessary.  ?No pain, discharge, or bleeding.  ? ?Her last pessary check was November, 2022. ? ?Patient's daughter states patient treated for UTI 11-10-21. This is second one since 06/2021. Patient having some fecal incontinence at times.  ? ?GYNECOLOGIC HISTORY: ?No LMP recorded. Patient is postmenopausal. ?Contraception:  PMP ?Menopausal hormone therapy:  none ?Last mammogram:  06-25-21 Neg/Birads1 ?Last pap smear:  12-04-20 ASCUS:Neg HR HPV, 05-16-18 ASCUS:Neg HR HPV ?       ?OB History   ? ? Gravida  ?1  ? Para  ?1  ? Term  ?   ? Preterm  ?   ? AB  ?   ? Living  ?1  ?  ? ? SAB  ?   ? IAB  ?   ? Ectopic  ?   ? Multiple  ?   ? Live Births  ?   ?   ?  ?  ?    ? ?Patient Active Problem List  ? Diagnosis Date Noted  ? Acute cystitis with hematuria 11/11/2021  ? Dysuria 11/10/2021  ? Dementia without behavioral disturbance ([place]) 06/10/2020  ? History of colonic polyps 10/15/2015  ? AVNRT (AV nodal re-entry tachycardia) ([place]) 08/19/2015  ? Major depressive disorder, recurrent episode, in full remission ([place]) 07/24/2010  ? OSTEOPOROSIS 07/24/2010  ? INSOMNIA 08/29/2009  ? Hyperlipidemia with target LDL less than 100 12/19/2008  ? Hypothyroidism 07/02/2008  ? NEPHROLITHIASIS, HX OF 07/02/2008  ? ? ?Past Medical History:  ?Diagnosis Date  ? ASCUS of cervix with negative high risk HPV 05/2018  ? Repeat Pap smear one year interval  ? AVNRT (AV nodal re-entry tachycardia) ([place]) 08/19/2015  ? S/p successful ablation 07/2015.  ? Dementia without behavioral disturbance ([place]) 06/10/2020  ? Dysrhythmia   ? hx of SVT - 2017   ? History of colonic polyps   ? History of kidney stones   ? Hypothyroidism   ? Liver cyst   ? Memory loss   ? Nephrolithiasis   ? Osteoporosis   ? ? ?Past Surgical History:  ?Procedure Laterality Date  ? ABLATION    ? BACK SURGERY    ?  [phone]  ? ELECTROPHYSIOLOGIC STUDY N/A 07/22/2015  ? Procedure: SVT Ablation;  Surgeon: Will Meredith Leeds, MD;  Location: [facility];  Service: Cardiovascular;  Laterality: N/A;  ? PARTIAL COLECTOMY  2005  ? SMALL INTESTINE SURGERY    ? THYROIDECTOMY  2008  ? TOTAL HIP ARTHROPLASTY Right 08/19/2017  ? Procedure: RIGHT TOTAL HIP ARTHROPLASTY ANTERIOR APPROACH;  Surgeon: Rod Can, MD;  Location: [facility];  Service: Orthopedics;  Laterality: Right;  ? ? ?Current Outpatient Medications  ?Medication Sig Dispense Refill  ? alendronate (FOSAMAX) 70 MG tablet TAKE 1 TABLET BY MOUTH EVERY 7 DAYS. TAKE WITH A FULL GLASS OF WATER ON AN EMPTY STOMACH. 12 tablet 3  ? Calcium Carbonate-Vitamin D (CALTRATE 600+D PO) Take 2 tablets by mouth daily.    ? donepezil (ARICEPT) 10 MG tablet TAKE ONE TAB AT BEDTIME. 90 tablet 3  ? estradiol (ESTRACE) 0.1 MG/GM vaginal cream Use 1 gram per vagina with pessary care in the office. 42.5 g 0  ? fluocinonide ointment (LIDEX) 2.20 % Apply 1 application topically 2 (two) times daily. 60 g 1  ? fluticasone (FLONASE)  50 MCG/ACT nasal spray Place into both nostrils daily.    ? ibuprofen (ADVIL,MOTRIN) 200 MG tablet Take 400 mg by mouth daily as needed for moderate pain.    ? levothyroxine (SYNTHROID) 100 MCG tablet 1 tablet 30 minutes before breakfast 90 tablet 3  ? memantine (NAMENDA) 10 MG tablet TAKE 1 TABLET BY MOUTH 2 TIMES DAILY. 180 tablet 3  ? rosuvastatin (CRESTOR) 10 MG tablet TAKE 1 TABLET BY MOUTH DAILY. 90 tablet 3  ? ?No current facility-administered medications for this visit.  ?  ? ?ALLERGIES: Patient has no known allergies. ? ?Family History  ?Problem Relation Age of Onset  ? Stroke Mother   ? Alzheimer's disease Mother   ? Heart failure Brother   ? Alzheimer's disease Sister   ? Other Father   ?     natural causes  ? Heart attack Brother   ? Colon cancer Other   ?     nephew  ? Ulcerative colitis Sister   ?     colon removed  ? ? ?Social History   ? ?Socioeconomic History  ? Marital status: Divorced  ?  Spouse name: Not on file  ? Number of children: 1  ? Years of education: HS  ? Highest education level: Not on file  ?Occupational History  ? Occupation: retired [employer]  ?  Employer: RETIRED  ?Tobacco Use  ? Smoking status: Never  ? Smokeless tobacco: Never  ?Vaping Use  ? Vaping Use: Never used  ?Substance and Sexual Activity  ? Alcohol use: No  ?  Alcohol/week: 0.0 standard drinks  ? Drug use: No  ? Sexual activity: Never  ?Other Topics Concern  ? Not on file  ?Social History Narrative  ? Regular exercise--no  ? Lives at home alone.  ? Right-handed.  ? 1 cup caffeine daily.  ?   ?   ?   ? Epworth Sleepiness Scale  ?   ? Total: 4  ?   ?   ?   ?   ? ?Social Determinants of Health  ? ?Financial Resource Strain: Not on file  ?Food Insecurity: Not on file  ?Transportation Needs: Not on file  ?Physical Activity: Not on file  ?Stress: Not on file  ?Social Connections: Not on file  ?Intimate Partner Violence: Not on file  ? ? ?Review of Systems  ?Gastrointestinal:   ?     Fecal incontinence  ?All other systems reviewed and are negative. ? ?PHYSICAL EXAMINATION:   ? ?BP 130/74   Pulse (!) 55   Ht 5' 4.5" (1.638 m)   Wt 146 lb (66.2 kg)   SpO2 94%   BMI 24.67 kg/m?     ?General appearance: alert, cooperative and appears stated age ?  ?Pelvic: External genitalia:  no lesions ?             Urethra:  normal appearing urethra with no masses, tenderness or lesions ?             Bartholins and Skenes: normal    ?             Vagina: normal appearing vagina with normal color and discharge, no lesions ?             Cervix: no lesions ?               ?Bimanual Exam:  Uterus:  normal size, contour, position, consistency, mobility, non-tender ?  Adnexa: no mass, fullness, tenderness ?        ?Pessary removed, cleansed and replaced with 1 gram of estrace cream.   Pessary is holding pelvic organs well.  ? ?Chaperone was present for  exam:  Estill Bamberg, CMA ? ?ASSESSMENT ? ?Pelvic organ relaxation.  ?Pessary maintenance.  ?History of UTI. ? ?PLAN ? ?I recommending continuation of pessary.  ?Patient and her daughter were instructed to have patient use a pea sized amount of estrogen to her urethra twice weekly for UTI prevention.  ?Return 6 months for a pessary check and prn.  ?  ?An After Visit Summary was printed and given to the patient. ? ?  ? ? ?

## 2021-11-26 ENCOUNTER — Ambulatory Visit: Payer: Medicare Other | Admitting: Obstetrics and Gynecology

## 2021-12-03 ENCOUNTER — Encounter: Payer: Self-pay | Admitting: Family Medicine

## 2021-12-05 DIAGNOSIS — Z029 Encounter for administrative examinations, unspecified: Secondary | ICD-10-CM

## 2021-12-05 NOTE — Telephone Encounter (Signed)
FL-2 form placed in Dr. Lillie Fragmin office in box to complete.

## 2021-12-05 NOTE — Telephone Encounter (Signed)
Monica Irwin from [facility] called to ask if we had received the FL2 form and we did, I attached charge form and put in providers folder. She said they are trying to move pt in early next week and asked if it could please be done as soon as possible. I did let her know that we are out of office Monday.

## 2021-12-09 NOTE — Telephone Encounter (Signed)
Monica Irwin called back and wanted to know if there is anyway they could get it back by noon tomorrow

## 2021-12-10 NOTE — Telephone Encounter (Signed)
Completed and we are faxing this am.

## 2021-12-10 NOTE — Telephone Encounter (Signed)
Spoke with Gordy Levan and Jackelyn Poling.  They need FL-2 form changed from memory care to assisted living.  Also they wanted where Dr. Lorelei Pont wrote progressing dementia on the bottom of the FL-2 form marked out.  That does not need to be on the form.  Corrections made and faxed back to Leigh Aurora at [phone].

## 2021-12-10 NOTE — Telephone Encounter (Signed)
Pt son-law Hank Gill called and said that a correction needed to be made to the [facility] form,He said at Section 11 that indicated memory care, it needs to be changed to just assisted living. He said he can explain more if needed and the call back is [phone]

## 2022-01-06 ENCOUNTER — Encounter (INDEPENDENT_AMBULATORY_CARE_PROVIDER_SITE_OTHER): Payer: Medicare Other

## 2022-01-06 ENCOUNTER — Other Ambulatory Visit: Payer: Self-pay | Admitting: Obstetrics and Gynecology

## 2022-01-06 DIAGNOSIS — M810 Age-related osteoporosis without current pathological fracture: Secondary | ICD-10-CM

## 2022-01-06 DIAGNOSIS — Z780 Asymptomatic menopausal state: Secondary | ICD-10-CM | POA: Diagnosis not present

## 2022-01-06 DIAGNOSIS — Z13820 Encounter for screening for osteoporosis: Secondary | ICD-10-CM | POA: Diagnosis not present

## 2022-02-18 ENCOUNTER — Encounter: Payer: Self-pay | Admitting: Podiatry

## 2022-02-18 ENCOUNTER — Ambulatory Visit (INDEPENDENT_AMBULATORY_CARE_PROVIDER_SITE_OTHER): Payer: Medicare Other | Admitting: Podiatry

## 2022-02-18 DIAGNOSIS — M79609 Pain in unspecified limb: Secondary | ICD-10-CM | POA: Diagnosis not present

## 2022-02-18 DIAGNOSIS — B351 Tinea unguium: Secondary | ICD-10-CM

## 2022-02-18 NOTE — Progress Notes (Signed)

## 2022-03-05 ENCOUNTER — Other Ambulatory Visit: Payer: Self-pay | Admitting: Family Medicine

## 2022-03-05 DIAGNOSIS — Z79899 Other long term (current) drug therapy: Secondary | ICD-10-CM

## 2022-03-05 DIAGNOSIS — E039 Hypothyroidism, unspecified: Secondary | ICD-10-CM

## 2022-03-05 DIAGNOSIS — E785 Hyperlipidemia, unspecified: Secondary | ICD-10-CM

## 2022-03-05 DIAGNOSIS — E559 Vitamin D deficiency, unspecified: Secondary | ICD-10-CM

## 2022-03-05 DIAGNOSIS — R7309 Other abnormal glucose: Secondary | ICD-10-CM

## 2022-03-18 ENCOUNTER — Other Ambulatory Visit: Payer: Medicare Other

## 2022-03-19 ENCOUNTER — Other Ambulatory Visit (INDEPENDENT_AMBULATORY_CARE_PROVIDER_SITE_OTHER): Payer: Medicare Other

## 2022-03-19 DIAGNOSIS — E785 Hyperlipidemia, unspecified: Secondary | ICD-10-CM

## 2022-03-19 DIAGNOSIS — Z79899 Other long term (current) drug therapy: Secondary | ICD-10-CM

## 2022-03-19 DIAGNOSIS — R7309 Other abnormal glucose: Secondary | ICD-10-CM

## 2022-03-19 DIAGNOSIS — E559 Vitamin D deficiency, unspecified: Secondary | ICD-10-CM

## 2022-03-19 DIAGNOSIS — E039 Hypothyroidism, unspecified: Secondary | ICD-10-CM

## 2022-03-19 LAB — CBC WITH DIFFERENTIAL/PLATELET
Basophils Absolute: 0 10*3/uL (ref 0.0–0.1)
Basophils Relative: 0.5 % (ref 0.0–3.0)
Eosinophils Absolute: 0.1 10*3/uL (ref 0.0–0.7)
Eosinophils Relative: 2.6 % (ref 0.0–5.0)
HCT: 45.4 % (ref 36.0–46.0)
Hemoglobin: 14.9 g/dL (ref 12.0–15.0)
Lymphocytes Relative: 21.2 % (ref 12.0–46.0)
Lymphs Abs: 1.1 10*3/uL (ref 0.7–4.0)
MCHC: 32.9 g/dL (ref 30.0–36.0)
MCV: 93.2 fl (ref 78.0–100.0)
Monocytes Absolute: 0.5 10*3/uL (ref 0.1–1.0)
Monocytes Relative: 10.7 % (ref 3.0–12.0)
Neutro Abs: 3.3 10*3/uL (ref 1.4–7.7)
Neutrophils Relative %: 65 % (ref 43.0–77.0)
Platelets: 210 10*3/uL (ref 150.0–400.0)
RBC: 4.88 Mil/uL (ref 3.87–5.11)
RDW: 13.6 % (ref 11.5–15.5)
WBC: 5 10*3/uL (ref 4.0–10.5)

## 2022-03-19 LAB — VITAMIN D 25 HYDROXY (VIT D DEFICIENCY, FRACTURES): VITD: 34.42 ng/mL (ref 30.00–100.00)

## 2022-03-19 LAB — HEPATIC FUNCTION PANEL
ALT: 21 U/L (ref 0–35)
AST: 19 U/L (ref 0–37)
Albumin: 4 g/dL (ref 3.5–5.2)
Alkaline Phosphatase: 61 U/L (ref 39–117)
Bilirubin, Direct: 0.1 mg/dL (ref 0.0–0.3)
Total Bilirubin: 0.5 mg/dL (ref 0.2–1.2)
Total Protein: 6.8 g/dL (ref 6.0–8.3)

## 2022-03-19 LAB — T3, FREE: T3, Free: 2.7 pg/mL (ref 2.3–4.2)

## 2022-03-19 LAB — T4, FREE: Free T4: 1.48 ng/dL (ref 0.60–1.60)

## 2022-03-19 LAB — LIPID PANEL
Cholesterol: 164 mg/dL (ref 0–200)
HDL: 60.8 mg/dL (ref >39.00)
LDL Cholesterol: 86 mg/dL (ref 0–99)
NonHDL: 102.82
Total CHOL/HDL Ratio: 3
Triglycerides: 86 mg/dL (ref 0.0–149.0)
VLDL: 17.2 mg/dL (ref 0.0–40.0)

## 2022-03-19 LAB — TSH: TSH: 0.14 u[IU]/mL — ABNORMAL LOW (ref 0.35–5.50)

## 2022-03-19 LAB — HEMOGLOBIN A1C: Hgb A1c MFr Bld: 6.4 % (ref 4.6–6.5)

## 2022-03-23 ENCOUNTER — Encounter: Payer: Medicare Other | Admitting: Family Medicine

## 2022-03-25 ENCOUNTER — Ambulatory Visit (INDEPENDENT_AMBULATORY_CARE_PROVIDER_SITE_OTHER): Payer: Medicare Other | Admitting: Family Medicine

## 2022-03-25 ENCOUNTER — Encounter: Payer: Self-pay | Admitting: Family Medicine

## 2022-03-25 ENCOUNTER — Encounter: Payer: Medicare Other | Admitting: Family Medicine

## 2022-03-25 ENCOUNTER — Ambulatory Visit: Payer: Medicare Other

## 2022-03-25 VITALS — BP 120/70 | HR 60 | Temp 97.9°F | Ht 63.75 in | Wt 146.0 lb

## 2022-03-25 DIAGNOSIS — Z23 Encounter for immunization: Secondary | ICD-10-CM

## 2022-03-25 DIAGNOSIS — Z0000 Encounter for general adult medical examination without abnormal findings: Secondary | ICD-10-CM | POA: Diagnosis not present

## 2022-03-25 NOTE — Progress Notes (Signed)
Monica Irwin T. Trentin Knappenberger, MD, [place] at [facility] [place], [address]  Phone: [phone]  FAX: [place] - 76 y.o. female  MRN [id number]  Date of Birth: 1945/12/30  Date: 03/25/2022  PCP: Owens Loffler, MD  Referral: Owens Loffler, MD  Chief Complaint  Patient presents with   Medicare Wellness   Patient Care Team: Owens Loffler, MD as PCP - General Nunzio Cobbs, MD as Consulting Physician (Obstetrics and Gynecology) Subjective:   Monica Irwin is a 76 y.o. pleasant patient who presents for a medicare wellness examination:  Health Maintenance Summary Reviewed and updated, unless pt declines services.  Tobacco History Reviewed. Non-smoker Alcohol: No concerns, no excessive use Exercise Habits: walks inside and is active at nursing facility STD concerns: none Drug Use: None Birth control method: n/a Menses regular: n/a Lumps or breast concerns: no Breast Cancer Family History: no  A1c = 6.4  On and off incontinence, and she wears a pad Some stool occ on pad, but she is continent - looser bm  Health Maintenance  Topic Date Due   COVID-19 Vaccine (4 - Pfizer risk series) 07/21/2020   COLONOSCOPY (Pts 45-47yr Insurance coverage will need to be confirmed)  10/29/2020   TETANUS/TDAP  01/21/2027   Pneumonia Vaccine 76 Years old  Completed   INFLUENZA VACCINE  Completed   DEXA SCAN  Completed   Hepatitis C Screening  Completed   Zoster Vaccines- Shingrix  Completed   HPV VACCINES  Aged Out   Immunization History  Administered Date(s) Administered   Fluad Quad(high Dose 65+) 04/06/2019, 03/19/2021, 03/25/2022   Influenza Whole 04/10/2009, 03/13/2010   Influenza, High Dose Seasonal PF 04/01/2018   Influenza,inj,Quad PF,6+ Mos 04/22/2015, 03/30/2016, 05/24/2017   Influenza-Unspecified 04/12/2013, 04/12/2017   PFIZER(Purple Top)SARS-COV-2  Vaccination 08/01/2019, 08/21/2019, 05/26/2020   Pneumococcal Conjugate-13 11/08/2013   Pneumococcal Polysaccharide-23 05/22/2010   Td 07/13/2001   Tdap 01/20/2017   Zoster Recombinat (Shingrix) 02/15/2019, 04/24/2019   Zoster, Live 07/13/2001    Patient Active Problem List   Diagnosis Date Noted   Dementia without behavioral disturbance ([place] 06/10/2020    Priority: High   Hyperlipidemia with target LDL less than 100 12/19/2008    Priority: Medium    Hypothyroidism 07/02/2008    Priority: Medium    History of colonic polyps 10/15/2015   AVNRT (AV nodal re-entry tachycardia) ([facility] 08/19/2015   Major depressive disorder, recurrent episode, in full remission ([facility] 07/24/2010   OSTEOPOROSIS 07/24/2010   INSOMNIA 08/29/2009   NEPHROLITHIASIS, HX OF 07/02/2008    Past Medical History:  Diagnosis Date   ASCUS of cervix with negative high risk HPV 05/2018   Repeat Pap smear one year interval   AVNRT (AV nodal re-entry tachycardia) ([facility] 08/19/2015   S/p successful ablation 07/2015.   Dementia without behavioral disturbance ([facility] 06/10/2020   History of colonic polyps    History of kidney stones    Hypothyroidism    Liver cyst    Nephrolithiasis    Osteoporosis     Past Surgical History:  Procedure Laterality Date   ABLATION     BACK SURGERY     [phone]  ELECTROPHYSIOLOGIC STUDY N/A 07/22/2015   Procedure: SVT Ablation;  Surgeon: Will MMeredith Leeds MD;  [facility];  Service: Cardiovascular;  Laterality: N/A;   PARTIAL COLECTOMY  2005   SMALL INTESTINE SURGERY     THYROIDECTOMY  2008  TOTAL HIP ARTHROPLASTY Right 08/19/2017   Procedure: RIGHT TOTAL HIP ARTHROPLASTY ANTERIOR APPROACH;  Surgeon: Rod Can, MD;  [facility];  Service: Orthopedics;  Laterality: Right;    Family History  Problem Relation Age of Onset   Stroke Mother    Alzheimer's disease Mother    Heart failure Brother    Alzheimer's disease Sister    Other Father         natural causes   Heart attack Brother    Colon cancer Other        nephew   Ulcerative colitis Sister        colon removed    Social History   Social History Narrative   Regular exercise--no   Lives at [facility].   Daughter = Monica Irwin   Right-handed.   1 cup caffeine daily.                Past Medical History, Surgical History, Social History, Family History, Problem List, Medications, and Allergies have been reviewed and updated if relevant.  Review of Systems: Pertinent positives are listed above.  Otherwise, a full 14 point review of systems has been done in full and it is negative except where it is noted positive.  Objective:   BP 120/70   Pulse 60   Temp 97.9 F (36.6 C) (Oral)   Ht 5' 3.75" (1.619 m)   Wt 146 lb (66.2 kg)   SpO2 96%   BMI 25.26 kg/m     05/24/2017    2:05 PM 02/13/2019   10:20 AM 03/07/2020    9:30 AM 03/19/2021   10:05 AM 03/25/2022   11:10 AM  Fall Risk  Falls in the past year? No 0 0 0 0   Ideal Body Weight:  Weight in (lb) to have BMI = 25: 144.2 Hearing Screening  Method: Audiometry   '500Hz'$  '1000Hz'$  '2000Hz'$  '4000Hz'$   Right ear 40 0 40 0  Left ear 40 40 40 0  Vision Screening - Comments:: Eye exam with Dorena Cookey 2023    03/25/2022   11:11 AM 03/19/2021   10:05 AM 03/07/2020    9:30 AM 02/13/2019   10:20 AM 05/24/2017    2:05 PM  Depression screen PHQ 2/9  Decreased Interest 0 0 0 0 0  Down, Depressed, Hopeless 1 0 0 0 0  PHQ - 2 Score 1 0 0 0 0  Altered sleeping     0  Tired, decreased energy     0  Change in appetite     0  Feeling bad or failure about yourself      0  Trouble concentrating     0  Moving slowly or fidgety/restless     0  Suicidal thoughts     0  PHQ-9 Score     0  Difficult doing work/chores     Not difficult at all     GEN: well developed, well nourished, no acute distress Eyes: conjunctiva and lids normal, PERRLA, EOMI ENT: TM clear, nares clear, oral exam WNL Neck: supple, no  lymphadenopathy, no thyromegaly, no JVD Pulm: clear to auscultation and percussion, respiratory effort normal CV: regular rate and rhythm, S1-S2, no murmur, rub or gallop, no bruits Chest: no scars, masses, no lumps BREAST: defer GI: soft, non-tender; no hepatosplenomegaly, masses; active bowel sounds all quadrants GU: defer Lymph: no cervical, axillary or inguinal adenopathy MSK: gait normal, muscle tone and strength WNL, no joint swelling, effusions, discoloration, crepitus  SKIN: clear, good turgor, color WNL, no rashes, lesions, or ulcerations Neuro: normal mental status, normal strength, sensation, and motion Psych: alert; oriented to person, place and time, normally interactive and not anxious or depressed in appearance.  All labs reviewed with patient.  Results for orders placed or performed in visit on 03/19/22  T4, free  Result Value Ref Range   Free T4 1.48 0.60 - 1.60 ng/dL  T3, free  Result Value Ref Range   T3, Free 2.7 2.3 - 4.2 pg/mL  TSH  Result Value Ref Range   TSH 0.14 (L) 0.35 - 5.50 uIU/mL  VITAMIN D 25 Hydroxy (Vit-D Deficiency, Fractures)  Result Value Ref Range   VITD 34.42 30.00 - 100.00 ng/mL  CBC with Differential/Platelet  Result Value Ref Range   WBC 5.0 4.0 - 10.5 K/uL   RBC 4.88 3.87 - 5.11 Mil/uL   Hemoglobin 14.9 12.0 - 15.0 g/dL   HCT 45.4 36.0 - 46.0 %   MCV 93.2 78.0 - 100.0 fl   MCHC 32.9 30.0 - 36.0 g/dL   RDW 13.6 11.5 - 15.5 %   Platelets 210.0 150.0 - 400.0 K/uL   Neutrophils Relative % 65.0 43.0 - 77.0 %   Lymphocytes Relative 21.2 12.0 - 46.0 %   Monocytes Relative 10.7 3.0 - 12.0 %   Eosinophils Relative 2.6 0.0 - 5.0 %   Basophils Relative 0.5 0.0 - 3.0 %   Neutro Abs 3.3 1.4 - 7.7 K/uL   Lymphs Abs 1.1 0.7 - 4.0 K/uL   Monocytes Absolute 0.5 0.1 - 1.0 K/uL   Eosinophils Absolute 0.1 0.0 - 0.7 K/uL   Basophils Absolute 0.0 0.0 - 0.1 K/uL  Hemoglobin A1c  Result Value Ref Range   Hgb A1c MFr Bld 6.4 4.6 - 6.5 %  Hepatic  function panel  Result Value Ref Range   Total Bilirubin 0.5 0.2 - 1.2 mg/dL   Bilirubin, Direct 0.1 0.0 - 0.3 mg/dL   Alkaline Phosphatase 61 39 - 117 U/L   AST 19 0 - 37 U/L   ALT 21 0 - 35 U/L   Total Protein 6.8 6.0 - 8.3 g/dL   Albumin 4.0 3.5 - 5.2 g/dL  Lipid panel  Result Value Ref Range   Cholesterol 164 0 - 200 mg/dL   Triglycerides 86.0 0.0 - 149.0 mg/dL   HDL 60.80 >39.00 mg/dL   VLDL 17.2 0.0 - 40.0 mg/dL   LDL Cholesterol 86 0 - 99 mg/dL   Total CHOL/HDL Ratio 3    NonHDL 102.82     No results found.  Assessment and Plan:     ICD-10-CM   1. Healthcare maintenance  Z00.00 Flu Vaccine QUAD High Dose(Fluad)    2. Need for influenza vaccination  Z23 Flu Vaccine QUAD High Dose(Fluad)     Developing prediabetes Doing ok with transition to nursing facility Eating well  T3 and T4 are stable  No changes - eat healthy and try to get more exercise  Health Maintenance Exam: The patient's preventative maintenance and recommended screening tests for an annual wellness exam were reviewed in full today. Brought up to date unless services declined.  Counselled on the importance of diet, exercise, and its role in overall health and mortality. The patient's FH and SH was reviewed, including their home life, tobacco status, and drug and alcohol status.  Follow-up in 1 year for physical exam or additional follow-up below.  I have personally reviewed the Medicare Annual Wellness questionnaire and have noted  1. The patient's medical and social history 2. Their use of alcohol, tobacco or illicit drugs 3. Their current medications and supplements 4. The patient's functional ability including ADL's, fall risks, home safety risks and hearing or visual             impairment. 5. Diet and physical activities 6. Evidence for depression or mood disorders 7. Reviewed Updated provider list, see scanned forms and [facility] Snapshot.  8. Reviewed whether or not the patient has HCPOA or  living will, and discussed what this means with the patient.  Recommended she bring in a copy for his chart in [facility].  The patients weight, height, BMI and visual acuity have been recorded in the chart I have made referrals, counseling and provided education to the patient based review of the above and I have provided the pt with a written personalized care plan for preventive services.  I have provided the patient with a copy of your personalized plan for preventive services. Instructed to take the time to review along with their updated medication list.  Disposition: No follow-ups on file.  Future Appointments  Date Time Provider [place]  04/22/2022 10:30 AM Nunzio Cobbs, MD [facility] None  05/27/2022 10:30 AM Gardiner Barefoot, DPM [facility]    No orders of the defined types were placed in this encounter.  Medications Discontinued During This Encounter  Medication Reason   fluocinonide ointment (LIDEX) 0.05 % Completed Course   Orders Placed This Encounter  Procedures   Flu Vaccine QUAD High Dose(Fluad)    Signed,  Cordarius Benning T. Cortavious Nix, MD   Allergies as of 03/25/2022   No Known Allergies      Medication List        Accurate as of March 25, 2022  1:05 PM. If you have any questions, ask your nurse or doctor.          STOP taking these medications    fluocinonide ointment 0.05 % Commonly known as: LIDEX Stopped by: Owens Loffler, MD       TAKE these medications    alendronate 70 MG tablet Commonly known as: FOSAMAX TAKE 1 TABLET BY MOUTH EVERY 7 DAYS. TAKE WITH A FULL GLASS OF WATER ON AN EMPTY STOMACH.   CALTRATE 600+D PO Take 2 tablets by mouth daily.   donepezil 10 MG tablet Commonly known as: ARICEPT TAKE ONE TAB AT BEDTIME.   estradiol 0.1 MG/GM vaginal cream Commonly known as: ESTRACE Use 1 gram per vagina with pessary care in the office.   fluticasone 50 MCG/ACT nasal spray Commonly known as: FLONASE Place  into both nostrils daily.   ibuprofen 200 MG tablet Commonly known as: ADVIL Take 400 mg by mouth daily as needed for moderate pain.   levothyroxine 100 MCG tablet Commonly known as: SYNTHROID 1 tablet 30 minutes before breakfast   memantine 10 MG tablet Commonly known as: NAMENDA TAKE 1 TABLET BY MOUTH 2 TIMES DAILY.   rosuvastatin 10 MG tablet Commonly known as: CRESTOR TAKE 1 TABLET BY MOUTH DAILY.

## 2022-04-09 ENCOUNTER — Other Ambulatory Visit: Payer: Self-pay | Admitting: Family Medicine

## 2022-04-22 ENCOUNTER — Encounter: Payer: Self-pay | Admitting: Obstetrics and Gynecology

## 2022-04-22 ENCOUNTER — Ambulatory Visit (INDEPENDENT_AMBULATORY_CARE_PROVIDER_SITE_OTHER): Payer: Medicare Other | Admitting: Obstetrics and Gynecology

## 2022-04-22 VITALS — BP 128/80 | HR 62 | Ht 64.0 in | Wt 146.0 lb

## 2022-04-22 DIAGNOSIS — Z4689 Encounter for fitting and adjustment of other specified devices: Secondary | ICD-10-CM

## 2022-04-22 DIAGNOSIS — R159 Full incontinence of feces: Secondary | ICD-10-CM | POA: Diagnosis not present

## 2022-04-22 DIAGNOSIS — N812 Incomplete uterovaginal prolapse: Secondary | ICD-10-CM | POA: Diagnosis not present

## 2022-04-22 NOTE — Progress Notes (Signed)
GYNECOLOGY  VISIT   HPI: 76 y.o.   Divorced  Caucasian  female   G1P1 with No LMP recorded. Patient is postmenopausal.   here for Pessary check.  Last pessary check was 11/20/21.  Some urgency to void.  No painful urination.  No vaginal bleeding.  Voiding well and having bowel movements.   Patient is ok to continue pessary use.  Daughter asking at the end of the visit about medication to help add fiber for BMs.  Patient's daughter is noting fecal incontinence.   GYNECOLOGIC HISTORY: No LMP recorded. Patient is postmenopausal. Contraception:  postmenopausal Menopausal hormone therapy:  estradiol Last mammogram:  06/25/2021 BI-RADS CATEGORY  1: Negative. Last pap smear:   12/04/2020 atypical squamous cells and negative high risk HPV.         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   Dementia without behavioral disturbance ([place]) 06/10/2020   History of colonic polyps    History of kidney stones    Hypothyroidism    Liver cyst    Nephrolithiasis    Osteoporosis     Past Surgical History:  Procedure Laterality Date   ABLATION     BACK SURGERY      [phone]   ELECTROPHYSIOLOGIC STUDY N/A 07/22/2015   Procedure: SVT Ablation;  Surgeon: Will Meredith Leeds, MD;  Location: [facility];  Service: Cardiovascular;  Laterality: N/A;   PARTIAL COLECTOMY  2005   SMALL INTESTINE SURGERY     THYROIDECTOMY  2008   TOTAL HIP ARTHROPLASTY Right 08/19/2017   Procedure: RIGHT TOTAL HIP ARTHROPLASTY ANTERIOR APPROACH;  Surgeon: Rod Can, MD;  Location: [facility];  Service: Orthopedics;  Laterality: Right;    Current Outpatient Medications  Medication Sig Dispense Refill   alendronate (FOSAMAX) 70 MG tablet TAKE 1 TABLET BY MOUTH EVERY 7 DAYS. TAKE WITH A FULL GLASS OF WATER ON AN EMPTY STOMACH. 12 tablet 3   Calcium Carbonate-Vitamin D (CALTRATE 600+D PO) Take 2 tablets by mouth daily.     donepezil (ARICEPT) 10 MG tablet TAKE ONE TAB AT BEDTIME. 90 tablet 3   estradiol (ESTRACE) 0.1 MG/GM vaginal cream Use 1 gram per vagina with pessary care in the office. 42.5 g 0   fluticasone (FLONASE) 50 MCG/ACT nasal spray Place into both nostrils daily.     ibuprofen (ADVIL,MOTRIN) 200 MG tablet Take 400 mg by mouth  daily as needed for moderate pain.     levothyroxine (SYNTHROID) 100 MCG tablet TAKE 1 TABLET BY MOUTH ONCE DAILY 30 MINUTES BEFORE BREAKFAST 90 tablet 3   memantine (NAMENDA) 10 MG tablet TAKE 1 TABLET BY MOUTH 2 TIMES DAILY. 180 tablet 3   rosuvastatin (CRESTOR) 10 MG tablet TAKE 1 TABLET BY MOUTH DAILY. 90 tablet 3   No current facility-administered medications for this visit.     ALLERGIES: Patient has no known allergies.  Family History  Problem Relation Age of Onset   Stroke Mother    Alzheimer's disease Mother    Heart failure Brother    Alzheimer's disease Sister    Other Father        natural causes   Heart attack Brother    Colon cancer Other        nephew   Ulcerative colitis Sister        colon removed    Social History   Socioeconomic History   Marital status: Divorced    Spouse name: Not on file    Number of children: 1   Years of education: HS   Highest education level: Not on file  Occupational History   Occupation: retired Therapist, music: RETIRED  Tobacco Use   Smoking status: Never   Smokeless tobacco: Never  Vaping Use   Vaping Use: Never used  Substance and Sexual Activity   Alcohol use: No    Alcohol/week: 0.0 standard drinks of alcohol   Drug use: No   Sexual activity: Never  Other Topics Concern   Not on file  Social History Narrative   Regular exercise--no   Lives at [place] home.   Daughter = [name]   Right-handed.   1 cup caffeine daily.               Social Determinants of Health   Financial Resource Strain: Not on file  Food Insecurity: Not on file  Transportation Needs: Not on file  Physical Activity: Not on file  Stress: Not on file  Social Connections: Not on file  Intimate Partner Violence: Not on file    Review of Systems  All other systems reviewed and are negative.   PHYSICAL EXAMINATION:    BP 128/80 (BP Location: Right Arm, Patient Position: Sitting, Cuff Size: Normal)   Pulse 62   Ht '5\' 4"'$  (1.626 m)   Wt 146 lb (66.2 kg)   BMI 25.06 kg/m     General appearance: alert, cooperative and appears stated age   Pelvic: External genitalia:  no lesions              Urethra:  normal appearing urethra with no masses, tenderness or lesions              Bartholins and Skenes: normal                 Vagina: normal appearing vagina with normal color and discharge, no lesions              Cervix: no lesions.  Minor erythema of the cervix and posterior vaginal apex.                 Bimanual Exam:  Uterus:  normal size, contour, position, consistency, mobility, non-tender              Adnexa: no mass, fullness, tenderness  Pessary ring with support removed, cleansed and replaced with 1 gram of  Estrace cream.      Chaperone was present for exam:  Kimalexis, CMA  ASSESSMENT  Pelvic organ  relaxation.  Pessary maintenance.  History of UTI.  PLAN  Continue pessary care.  Start Metamucil capsule or powder.  Start twice a week and slowly build up to daily use.  FU here for next pessary check in 4 weeks.   Bring estrogen cream with to the visit.    An After Visit Summary was printed and given to the patient.

## 2022-04-30 ENCOUNTER — Encounter: Payer: Self-pay | Admitting: Family Medicine

## 2022-04-30 ENCOUNTER — Ambulatory Visit (INDEPENDENT_AMBULATORY_CARE_PROVIDER_SITE_OTHER): Payer: Medicare Other | Admitting: Family Medicine

## 2022-04-30 VITALS — BP 100/70 | HR 50 | Temp 97.9°F | Ht 64.0 in | Wt 157.0 lb

## 2022-04-30 DIAGNOSIS — M1712 Unilateral primary osteoarthritis, left knee: Secondary | ICD-10-CM | POA: Diagnosis not present

## 2022-04-30 MED ORDER — CELECOXIB 200 MG PO CAPS: 200.0000 mg | ORAL_CAPSULE | Freq: Every day | ORAL | 2 refills | Status: DC

## 2022-04-30 MED ORDER — TRIAMCINOLONE ACETONIDE 40 MG/ML IJ SUSP
40.0000 mg | Freq: Once | INTRAMUSCULAR | Status: AC
  Administered 2022-04-30: 40 mg via INTRA_ARTICULAR

## 2022-04-30 NOTE — Addendum Note (Signed)
Addended by: Carter Kitten on: 04/30/2022 09:59 AM   Modules accepted: Orders

## 2022-04-30 NOTE — Progress Notes (Signed)
Monica Holaday T. [name], MD, [place] at [facility] Monica Irwin, [address]  Phone: [phone]  FAX: [place] - 76 y.o. female  MRN [id number]  Date of Birth: [birth year]  Date: 04/30/2022  PCP: Owens Loffler, MD  Referral: Owens Loffler, MD  Chief Complaint  Patient presents with   Knee Pain    Left   Subjective:   Monica Irwin is a 76 y.o. very pleasant female patient with Body mass index is 26.95 kg/m. who presents with the following:  Pleasant patient presents with L sided knee pain:   L knee hurts much of the time, hurts with walking, but cane will help more.  She is upright hurting a lot of the time.  Right now she is taking some Tylenol without much success.  She does not have any locking up of the joint or symptomatic giving way.  She does have a deep dull ache and she started to have some limping.  She does use a cane, which she can take some weight off of it does seem to help.  Review of Systems is noted in the HPI, as appropriate  Objective:   BP 100/70   Pulse (!) 50   Temp 97.9 F (36.6 C) (Oral)   Ht '5\' 4"'$  (1.626 m)   Wt 157 lb (71.2 kg)   SpO2 97%   BMI 26.95 kg/m   GEN: No acute distress; alert,appropriate. PULM: Breathing comfortably in no respiratory distress PSYCH: Normally interactive.   Left knee: Full extension.  Flexion to 120.  Stable to varus and valgus stress.  ACL and PCL are intact.  Does have some medial joint line tenderness greater than lateral joint line tenderness.  There is some pain with loading the patella.  Flexion causes pain.  Laboratory and Imaging Data:  Assessment and Plan:     ICD-10-CM   1. Primary osteoarthritis of left knee  M17.12      Acute on chronic osteoarthritis with exacerbation.  Think that hopefully doing some baseline Celebrex will help with her arthritis in general, and that will help her to be little bit  more mobile and active.  For her left knee, we will also do an intra-articular injection today.  Aspiration/Injection Procedure Note Monica Irwin 1945/08/05 Date of procedure: 04/30/2022  Procedure: Large Joint Aspiration / Injection of Knee, L  Indications: Pain  Procedure Details Patient verbally consented to procedure. Risks, benefits, and alternatives explained. Sterilely prepped with Chloraprep. Ethyl cholride used for anesthesia. 9 cc Lidocaine 1% mixed with 1 mL of Kenalog 40 mg injected using the anteromedial approach without difficulty. No complications with procedure and tolerated well. Patient had decreased pain post-injection. Medication: 1 mL of Kenalog 40 mg   Medication Management during today's office visit: Meds ordered this encounter  Medications   celecoxib (CELEBREX) 200 MG capsule    Sig: Take 1 capsule (200 mg total) by mouth daily.    Dispense:  30 capsule    Refill:  2   There are no discontinued medications.  Orders placed today for conditions managed today: No orders of the defined types were placed in this encounter.   Disposition: No follow-ups on file.  Dr[employer] One speech-to-text software was used for transcription in this dictation.  Possible transcriptional errors can occur using Editor, commissioning.   Signed,  Maud Deed. Crystalyn Delia, MD   Outpatient Encounter Medications as of 04/30/2022  Medication Sig  alendronate (FOSAMAX) 70 MG tablet TAKE 1 TABLET BY MOUTH EVERY 7 DAYS. TAKE WITH A FULL GLASS OF WATER ON AN EMPTY STOMACH.   Calcium Carbonate-Vitamin D (CALTRATE 600+D PO) Take 2 tablets by mouth daily.   celecoxib (CELEBREX) 200 MG capsule Take 1 capsule (200 mg total) by mouth daily.   donepezil (ARICEPT) 10 MG tablet TAKE ONE TAB AT BEDTIME.   estradiol (ESTRACE) 0.1 MG/GM vaginal cream Use 1 gram per vagina with pessary care in the office.   fluticasone (FLONASE) 50 MCG/ACT nasal spray Place into both nostrils daily.    ibuprofen (ADVIL,MOTRIN) 200 MG tablet Take 400 mg by mouth daily as needed for moderate pain.   levothyroxine (SYNTHROID) 100 MCG tablet TAKE 1 TABLET BY MOUTH ONCE DAILY 30 MINUTES BEFORE BREAKFAST   memantine (NAMENDA) 10 MG tablet TAKE 1 TABLET BY MOUTH 2 TIMES DAILY.   rosuvastatin (CRESTOR) 10 MG tablet TAKE 1 TABLET BY MOUTH DAILY.   No facility-administered encounter medications on file as of 04/30/2022.

## 2022-05-11 ENCOUNTER — Encounter: Payer: Self-pay | Admitting: Podiatry

## 2022-05-11 ENCOUNTER — Ambulatory Visit (INDEPENDENT_AMBULATORY_CARE_PROVIDER_SITE_OTHER): Payer: Medicare Other | Admitting: Podiatry

## 2022-05-11 DIAGNOSIS — B351 Tinea unguium: Secondary | ICD-10-CM

## 2022-05-11 DIAGNOSIS — M79609 Pain in unspecified limb: Secondary | ICD-10-CM

## 2022-05-11 DIAGNOSIS — F0390 Unspecified dementia without behavioral disturbance: Secondary | ICD-10-CM | POA: Diagnosis not present

## 2022-05-11 NOTE — Progress Notes (Signed)
This patient presents to the office with chief complaint of long thick painful nails.  Patient says the nails are painful walking and wearing shoes.  This patient is unable to self treat.  This patient is unable to trim her nails since she is unable to reach her nails.  She presents to the office for preventative foot care services.  General Appearance  Alert, conversant and in no acute stress.  Vascular  Dorsalis pedis and posterior tibial  pulses are palpable  bilaterally.  Capillary return is within normal limits  bilaterally. Temperature is within normal limits  bilaterally.  Neurologic  Senn-Weinstein monofilament wire test within normal limits  bilaterally. Muscle power within normal limits bilaterally.  Nails Thick disfigured discolored nails with subungual debris  hallux  nailsbilaterally. No evidence of bacterial infection or drainage bilaterally.  Orthopedic  No limitations of motion  feet .  No crepitus or effusions noted.  No bony pathology or digital deformities noted.  Skin  normotropic skin with no porokeratosis noted bilaterally.  No signs of infections or ulcers noted.     Onychomycosis  Nails  B/L.  Pain in right toes  Pain in left toes  Debridement of nails both feet followed trimming the nails with dremel tool.    RTC 4 months.   Rockie Vawter DPM   

## 2022-05-12 ENCOUNTER — Encounter: Payer: Self-pay | Admitting: Family Medicine

## 2022-05-12 ENCOUNTER — Other Ambulatory Visit: Payer: Self-pay | Admitting: Family Medicine

## 2022-05-12 NOTE — Telephone Encounter (Signed)
See  MyChart message in regards to Fosamax.  Refill or Deny?

## 2022-05-17 NOTE — Telephone Encounter (Signed)
Can you call [name] and have then d/c her Fosamax?  Likely in chart, or may need to get from daughter Monica Irwin.

## 2022-05-18 NOTE — Telephone Encounter (Signed)
Order faxed to [facility] to D/C Fosamax at [phone].

## 2022-05-18 NOTE — Telephone Encounter (Signed)
It will need to be a written order for me to fax over the [place].

## 2022-05-18 NOTE — Telephone Encounter (Signed)
done

## 2022-05-27 ENCOUNTER — Encounter: Payer: Self-pay | Admitting: Family Medicine

## 2022-05-27 ENCOUNTER — Ambulatory Visit: Payer: Medicare Other | Admitting: Podiatry

## 2022-05-27 NOTE — Telephone Encounter (Signed)
error 

## 2022-05-27 NOTE — Telephone Encounter (Signed)
Patient daughter called and said she gave the wrong fax number and this is the correct one [phone] for [name].

## 2022-05-27 NOTE — Telephone Encounter (Signed)
Patient duaghter Monica Irwin called in and stated that [name] never received the faxed. She was wanting to know if the order could be re faxed to [phone]. Thank you!

## 2022-05-28 ENCOUNTER — Other Ambulatory Visit: Payer: Self-pay | Admitting: Family Medicine

## 2022-05-28 DIAGNOSIS — Z1231 Encounter for screening mammogram for malignant neoplasm of breast: Secondary | ICD-10-CM

## 2022-06-11 ENCOUNTER — Encounter: Payer: Self-pay | Admitting: Podiatry

## 2022-07-20 ENCOUNTER — Other Ambulatory Visit: Payer: Self-pay | Admitting: Family Medicine

## 2022-07-20 ENCOUNTER — Encounter: Payer: Self-pay | Admitting: Family Medicine

## 2022-07-20 DIAGNOSIS — R17 Unspecified jaundice: Secondary | ICD-10-CM

## 2022-07-22 ENCOUNTER — Other Ambulatory Visit (INDEPENDENT_AMBULATORY_CARE_PROVIDER_SITE_OTHER): Payer: Medicare Other

## 2022-07-22 DIAGNOSIS — R17 Unspecified jaundice: Secondary | ICD-10-CM

## 2022-07-22 NOTE — Telephone Encounter (Signed)
Can you help her get Mrs. Bussa set up for a blood draw this afternoon?  I have placed orders.

## 2022-07-23 LAB — HEPATIC FUNCTION PANEL
ALT: 27 U/L (ref 0–35)
AST: 22 U/L (ref 0–37)
Albumin: 3.8 g/dL (ref 3.5–5.2)
Alkaline Phosphatase: 72 U/L (ref 39–117)
Bilirubin, Direct: 0.1 mg/dL (ref 0.0–0.3)
Total Bilirubin: 0.4 mg/dL (ref 0.2–1.2)
Total Protein: 6 g/dL (ref 6.0–8.3)

## 2022-07-29 ENCOUNTER — Encounter: Payer: Self-pay | Admitting: Podiatry

## 2022-07-29 ENCOUNTER — Ambulatory Visit (INDEPENDENT_AMBULATORY_CARE_PROVIDER_SITE_OTHER): Payer: Medicare Other | Admitting: Podiatry

## 2022-07-29 DIAGNOSIS — B351 Tinea unguium: Secondary | ICD-10-CM | POA: Diagnosis not present

## 2022-07-29 DIAGNOSIS — M79609 Pain in unspecified limb: Secondary | ICD-10-CM

## 2022-07-29 DIAGNOSIS — F0390 Unspecified dementia without behavioral disturbance: Secondary | ICD-10-CM | POA: Diagnosis not present

## 2022-07-29 NOTE — Progress Notes (Signed)
This patient presents to the office with chief complaint of long thick painful nails.  Patient says the nails are painful walking and wearing shoes.  This patient is unable to self treat.  This patient is unable to trim her nails since she is unable to reach her nails.  She presents to the office for preventative foot care services.  General Appearance  Alert, conversant and in no acute stress.  Vascular  Dorsalis pedis and posterior tibial  pulses are palpable  bilaterally.  Capillary return is within normal limits  bilaterally. Temperature is within normal limits  bilaterally.  Neurologic  Senn-Weinstein monofilament wire test within normal limits  bilaterally. Muscle power within normal limits bilaterally.  Nails Thick disfigured discolored nails with subungual debris  hallux nails  bilaterally. No evidence of bacterial infection or drainage bilaterally.  Orthopedic  No limitations of motion  feet .  No crepitus or effusions noted.  No bony pathology or digital deformities noted.  Skin  normotropic skin with no porokeratosis noted bilaterally.  No signs of infections or ulcers noted.     Onychomycosis  Nails  B/L.  Pain in right toes  Pain in left toes  Debridement of nails both feet followed trimming the nails with dremel tool.    RTC 10 weeks    Gardiner Barefoot DPM

## 2022-08-04 ENCOUNTER — Other Ambulatory Visit: Payer: Self-pay | Admitting: Family Medicine

## 2022-08-06 NOTE — Progress Notes (Signed)
GYNECOLOGY  VISIT   HPI: 77 y.o.   Divorced  Caucasian  female   G1P1 with No LMP recorded. Patient is postmenopausal.   here for   4 mo pessary check.  No pain, vaginal discharge, bleeding, or difficulty urinating.   Denies constipation.  Not using Metamucil.   Friends brought her to the office visit today.  GYNECOLOGIC HISTORY: No LMP recorded. Patient is postmenopausal. 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   Dementia without behavioral disturbance ([place]) 06/10/2020   History of colonic polyps    History of kidney stones    Hypothyroidism    Liver cyst    Nephrolithiasis    Osteoporosis     Past Surgical History:  Procedure Laterality Date   ABLATION     BACK SURGERY     [phone]   ELECTROPHYSIOLOGIC STUDY N/A 07/22/2015   Procedure: SVT Ablation;  Surgeon: Will Meredith Leeds, MD;  Location: [facility];  Service:  Cardiovascular;  Laterality: N/A;   PARTIAL COLECTOMY  2005   SMALL INTESTINE SURGERY     THYROIDECTOMY  2008   TOTAL HIP ARTHROPLASTY Right 08/19/2017   Procedure: RIGHT TOTAL HIP ARTHROPLASTY ANTERIOR APPROACH;  Surgeon: Rod Can, MD;  Location: [facility];  Service: Orthopedics;  Laterality: Right;    Current Outpatient Medications  Medication Sig Dispense Refill   alendronate (FOSAMAX) 70 MG tablet TAKE 1 TABLET BY MOUTH EVERY 7 DAYS. TAKE WITH A FULL GLASS OF WATER ON AN EMPTY STOMACH. 12 tablet 3   Calcium Carbonate-Vitamin D (CALTRATE 600+D PO) Take 2 tablets by mouth daily.     celecoxib (CELEBREX) 200 MG capsule Take 1 capsule (200 mg total) by mouth daily. 30 capsule 2   donepezil (ARICEPT) 10 MG tablet TAKE ONE TABLET BY MOUTH AT BEDTIME. 90 tablet 3   estradiol (ESTRACE) 0.1 MG/GM vaginal cream Use 1 gram per vagina with pessary care in the office. 42.5 g 0   fluticasone (FLONASE) 50 MCG/ACT nasal spray Place into both nostrils daily.     ibuprofen (ADVIL,MOTRIN) 200 MG tablet Take 400 mg by mouth daily as needed for moderate  pain.     levothyroxine (SYNTHROID) 100 MCG tablet TAKE 1 TABLET BY MOUTH ONCE DAILY 30 MINUTES BEFORE BREAKFAST 90 tablet 3   memantine (NAMENDA) 10 MG tablet TAKE 1 TABLET BY MOUTH 2 TIMES DAILY. 180 tablet 3   rosuvastatin (CRESTOR) 10 MG tablet TAKE 1 TABLET BY MOUTH DAILY. 90 tablet 3   No current facility-administered medications for this visit.     ALLERGIES: Patient has no known allergies.  Family History  Problem Relation Age of Onset   Stroke Mother    Alzheimer's disease Mother    Heart failure Brother    Alzheimer's disease Sister    Other Father        natural causes   Heart attack Brother    Colon cancer Other        nephew   Ulcerative colitis Sister        colon removed    Social History   Socioeconomic History   Marital status: Divorced    Spouse name: Not on file   Number of children: 1   Years of education: HS    Highest education level: Not on file  Occupational History   Occupation: retired Therapist, music: RETIRED  Tobacco Use   Smoking status: Never   Smokeless tobacco: Never  Vaping Use   Vaping Use: Never used  Substance and Sexual Activity   Alcohol use: No    Alcohol/week: 0.0 standard drinks of alcohol   Drug use: No   Sexual activity: Never  Other Topics Concern   Not on file  Social History Narrative   Regular exercise--no   Lives at [place] home.   Daughter = [name]   Right-handed.   1 cup caffeine daily.               Social Determinants of Health   Financial Resource Strain: Not on file  Food Insecurity: Not on file  Transportation Needs: Not on file  Physical Activity: Not on file  Stress: Not on file  Social Connections: Not on file  Intimate Partner Violence: Not on file    Review of Systems  All other systems reviewed and are negative.   PHYSICAL EXAMINATION:    BP 128/84 (BP Location: Right Arm, Patient Position: Sitting, Cuff Size: Normal)   Pulse (!) 57   Wt 184 lb (83.5 kg)   SpO2 97%   BMI 31.58 kg/m     General appearance: alert, cooperative and appears stated age   Pelvic: External genitalia:  no lesions              Urethra:  normal appearing urethra with no masses, tenderness or lesions              Bartholins and Skenes: normal                 Vagina:  mild erythema of the posterior vaginal cuff.  Friable with palpation with Q-Tip.               Cervix: no lesions                Bimanual Exam:  Uterus:  normal size, contour, position, consistency, mobility, non-tender              Adnexa: no mass, fullness, tenderness   Chaperone was present for exam:  Santiago Glad  ASSESSMENT  Pelvic organ relaxation.  Pessary maintenance.   PLAN  Continue pessary.  Place  estrogen cream 1 gram pv at hs once weekly.  FU in 3 months.    An After Visit Summary was printed and given to the patient.

## 2022-08-10 ENCOUNTER — Ambulatory Visit: Payer: Medicare Other | Admitting: Podiatry

## 2022-08-11 ENCOUNTER — Encounter: Payer: Self-pay | Admitting: Family Medicine

## 2022-08-11 NOTE — Progress Notes (Signed)
Monica Fiser [name], MD, [place] at [facility] [place], [address]  Phone: [phone]  FAX: [place] - 77 y.o. female  MRN [id number]  Date of Birth: 1946-02-11  Date: 08/12/2022  PCP: Owens Loffler, MD  Referral: Owens Loffler, MD  Chief Complaint  Patient presents with   Knee Pain    Left   Weight Gain   Subjective:   Monica Irwin is a 77 y.o. very pleasant female patient with Body mass index is 31.48 kg/m. who presents with the following:  Well-known patient of many years who presents with some ongoing left-sided knee pain.  The last time she was in the office in October, I did inject her knee to see if this would provide her with some significant relief.  Obtain plain x-rays today.  Her daughter also emailed me, and she was worried about her thyroid.  At the time of the office visit the patient's niece was here face-to-face with me, and we also did a conference call with the patient's daughter.  They provided almost the entirety of the history.  Voltaren gel  She has been gaining some weight, and her daughter is worried about that. One dessert per meal, limit to 1 glass of juice each meal.  Please encourage water with meals.   Knee pain and osteoarthritis ongoing.  I did inject her knee several months ago, and it provided some significant relief.  We also have her on some oral NSAIDs, and we are in and add some NSAIDs topically today.  Review of Systems is noted in the HPI, as appropriate  Objective:   BP 124/70   Pulse 70   Temp (!) 97.3 F (36.3 C) (Temporal)   Ht '5\' 4"'$  (1.626 m)   Wt 183 lb 6 oz (83.2 kg)   SpO2 95%   BMI 31.48 kg/m   GEN: No acute distress; alert,appropriate. PULM: Breathing comfortably in no respiratory distress PSYCH: Normally interactive.   Left knee: Full extension.  Flexion to 120.  Stable to varus and valgus stress.  ACL  and PCL are intact.  Does have medial and lateral joint line tenderness and some pain with manipulation of the patella. McMurray's and bounce home testing are negative  Laboratory and Imaging Data: DG Knee 4 Views W/Patella Left  Result Date: 08/12/2022 CLINICAL DATA:  Knee pain.  Suspect osteoarthritis. EXAM: LEFT KNEE - COMPLETE 4+ VIEW COMPARISON:  None Available. FINDINGS: There is diffuse decreased bone mineralization. Left knee: Mild patellofemoral and medial compartment joint space narrowing and peripheral osteophytosis. No knee joint effusion. Mild-to-moderate lateral and mild medial compartment chondrocalcinosis. No joint effusion. No acute fracture or dislocation. Right knee: On single frontal view, there is mild medial compartment joint space narrowing. Mild-to-moderate lateral and mild medial compartment chondrocalcinosis. Mild-to-moderate peripheral medial and lateral joint degenerative osteophytes. IMPRESSION: Left knee: 1. Mild patellofemoral and medial compartment osteoarthritis. 2. Mild-to-moderate lateral and mild medial compartment chondrocalcinosis. Electronically Signed   By: Yvonne Kendall M.D.   On: 08/12/2022 16:03     Assessment and Plan:     ICD-10-CM   1. Primary osteoarthritis of left knee  M17.12 DG Knee 4 Views W/Patella Left    triamcinolone acetonide (KENALOG-40) injection 40 mg    2. Other specified hypothyroidism  E03.8 TSH    T3, free    T4, free     Acute on chronic knee osteoarthritis with exacerbation.  Additional history  provided by daughter as primary historian.  At this time, the patient's thyroid studies have returned and are normal.  I believe that the weight gain is due to excess caloric intake at her assisted living.  I have included some orders to limit her to 1 dessert per meal and limit to 1 sugary drink per meal.  Aspiration/Injection Procedure Note Monica Irwin [birth year] Date of procedure: 08/12/2022  Procedure: Large Joint  Aspiration / Injection of Knee, L Indications: Pain  Procedure Details Patient verbally consented to procedure. Risks, benefits, and alternatives explained. Sterilely prepped with Chloraprep. Ethyl cholride used for anesthesia. 9 cc Lidocaine 1% mixed with 1 mL of Kenalog 40 mg injected using the anteromedial approach without difficulty. No complications with procedure and tolerated well. Patient had decreased pain post-injection. Medication: 1 mL of Kenalog 40 mg   Medication Management during today's office visit: Meds ordered this encounter  Medications   triamcinolone acetonide (KENALOG-40) injection 40 mg   There are no discontinued medications.  Orders placed today for conditions managed today: Orders Placed This Encounter  Procedures   DG Knee 4 Views W/Patella Left   TSH   T3, free   T4, free    Disposition: No follow-ups on file.  Dragon [employer] One speech-to-text software was used for transcription in this dictation.  Possible transcriptional errors can occur using Editor, commissioning.   Signed,  Maud Deed. Gareth Fitzner, MD   Outpatient Encounter Medications as of 08/12/2022  Medication Sig   alendronate (FOSAMAX) 70 MG tablet TAKE 1 TABLET BY MOUTH EVERY 7 DAYS. TAKE WITH A FULL GLASS OF WATER ON AN EMPTY STOMACH.   Calcium Carbonate-Vitamin D (CALTRATE 600+D PO) Take 2 tablets by mouth daily.   celecoxib (CELEBREX) 200 MG capsule Take 1 capsule (200 mg total) by mouth daily.   donepezil (ARICEPT) 10 MG tablet TAKE ONE TABLET BY MOUTH AT BEDTIME.   estradiol (ESTRACE) 0.1 MG/GM vaginal cream Use 1 gram per vagina with pessary care in the office.   fluticasone (FLONASE) 50 MCG/ACT nasal spray Place into both nostrils daily.   ibuprofen (ADVIL,MOTRIN) 200 MG tablet Take 400 mg by mouth daily as needed for moderate pain.   levothyroxine (SYNTHROID) 100 MCG tablet TAKE 1 TABLET BY MOUTH ONCE DAILY 30 MINUTES BEFORE BREAKFAST   memantine (NAMENDA) 10 MG tablet TAKE 1 TABLET BY  MOUTH 2 TIMES DAILY.   rosuvastatin (CRESTOR) 10 MG tablet TAKE 1 TABLET BY MOUTH DAILY.   [EXPIRED] triamcinolone acetonide (KENALOG-40) injection 40 mg    No facility-administered encounter medications on file as of 08/12/2022.

## 2022-08-12 ENCOUNTER — Ambulatory Visit (INDEPENDENT_AMBULATORY_CARE_PROVIDER_SITE_OTHER)
Admission: RE | Admit: 2022-08-12 | Discharge: 2022-08-12 | Disposition: A | Discharge status: Home / Self Care | Payer: Medicare Other | Source: Ambulatory Visit | Attending: Family Medicine | Admitting: Family Medicine

## 2022-08-12 ENCOUNTER — Encounter: Payer: Self-pay | Admitting: Family Medicine

## 2022-08-12 ENCOUNTER — Ambulatory Visit (INDEPENDENT_AMBULATORY_CARE_PROVIDER_SITE_OTHER): Payer: Medicare Other | Admitting: Family Medicine

## 2022-08-12 VITALS — BP 124/70 | HR 70 | Temp 97.3°F | Ht 64.0 in | Wt 183.4 lb

## 2022-08-12 DIAGNOSIS — E038 Other specified hypothyroidism: Secondary | ICD-10-CM

## 2022-08-12 DIAGNOSIS — M1712 Unilateral primary osteoarthritis, left knee: Secondary | ICD-10-CM

## 2022-08-12 LAB — TSH: TSH: 3.08 u[IU]/mL (ref 0.35–5.50)

## 2022-08-12 LAB — T4, FREE: Free T4: 0.81 ng/dL (ref 0.60–1.60)

## 2022-08-12 LAB — T3, FREE: T3, Free: 2.7 pg/mL (ref 2.3–4.2)

## 2022-08-12 MED ORDER — TRIAMCINOLONE ACETONIDE 40 MG/ML IJ SUSP
40.0000 mg | Freq: Once | INTRAMUSCULAR | Status: AC
  Administered 2022-08-12: 40 mg via INTRA_ARTICULAR

## 2022-08-14 ENCOUNTER — Ambulatory Visit: Payer: Medicare Other | Admitting: Podiatry

## 2022-08-19 ENCOUNTER — Encounter: Payer: Self-pay | Admitting: Obstetrics and Gynecology

## 2022-08-19 ENCOUNTER — Ambulatory Visit
Admission: RE | Admit: 2022-08-19 | Discharge: 2022-08-19 | Disposition: A | Discharge status: Home / Self Care | Payer: Medicare Other | Source: Ambulatory Visit | Attending: Family Medicine | Admitting: Family Medicine

## 2022-08-19 ENCOUNTER — Ambulatory Visit (INDEPENDENT_AMBULATORY_CARE_PROVIDER_SITE_OTHER): Payer: Medicare Other | Admitting: Obstetrics and Gynecology

## 2022-08-19 VITALS — BP 128/84 | HR 57 | Wt 184.0 lb

## 2022-08-19 DIAGNOSIS — Z4689 Encounter for fitting and adjustment of other specified devices: Secondary | ICD-10-CM | POA: Diagnosis not present

## 2022-08-19 DIAGNOSIS — N812 Incomplete uterovaginal prolapse: Secondary | ICD-10-CM | POA: Diagnosis not present

## 2022-08-19 DIAGNOSIS — Z1231 Encounter for screening mammogram for malignant neoplasm of breast: Secondary | ICD-10-CM

## 2022-08-19 NOTE — Patient Instructions (Signed)
Place 1 inch (1 gram) of estrogen cream in the vaginal at bedtime one night a week.  This will reduce inflammation of the pessary on the vaginal tissue.

## 2022-08-20 ENCOUNTER — Other Ambulatory Visit: Payer: Self-pay | Admitting: Family Medicine

## 2022-08-20 NOTE — Telephone Encounter (Signed)
Last office visit 08/12/2022 for Knee Pain/Weight Gain.  Last refilled 04/30/2022 for #30 with 2 refills.  No future appointments with PCP.

## 2022-08-24 ENCOUNTER — Ambulatory Visit (INDEPENDENT_AMBULATORY_CARE_PROVIDER_SITE_OTHER)
Admission: RE | Admit: 2022-08-24 | Discharge: 2022-08-24 | Disposition: A | Discharge status: Home / Self Care | Payer: Medicare Other | Source: Ambulatory Visit | Attending: Internal Medicine | Admitting: Internal Medicine

## 2022-08-24 ENCOUNTER — Encounter: Payer: Self-pay | Admitting: Internal Medicine

## 2022-08-24 ENCOUNTER — Ambulatory Visit (INDEPENDENT_AMBULATORY_CARE_PROVIDER_SITE_OTHER): Payer: Medicare Other | Admitting: Internal Medicine

## 2022-08-24 VITALS — BP 124/70 | HR 86 | Temp 97.1°F | Ht 64.0 in | Wt 183.0 lb

## 2022-08-24 DIAGNOSIS — J22 Unspecified acute lower respiratory infection: Secondary | ICD-10-CM

## 2022-08-24 MED ORDER — PREDNISONE 20 MG PO TABS: 40.0000 mg | ORAL_TABLET | Freq: Every day | ORAL | 0 refills | Status: DC

## 2022-08-24 MED ORDER — DOXYCYCLINE HYCLATE 100 MG PO TABS: 100.0000 mg | ORAL_TABLET | Freq: Two times a day (BID) | ORAL | 0 refills | Status: DC

## 2022-08-24 NOTE — Assessment & Plan Note (Addendum)
She notes nasal congestion but findings are in her chest No history of asthma or smoking--so will check CXR CXR is normal  Will try prednisone 40 x 3, 20 x 3 Will give doxy 100 bid x 7 days (though might be viral)

## 2022-08-24 NOTE — Progress Notes (Signed)
Subjective:    Patient ID: Monica Irwin, female    DOB: 1946/04/06, 77 y.o.   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 TAKE WITH A FULL GLASS OF WATER ON AN EMPTY STOMACH. 12 tablet 3   Calcium Carbonate-Vitamin D (CALTRATE 600+D PO) Take 2 tablets by mouth daily.     celecoxib (CELEBREX) 200 MG capsule TAKE 1 CAPSULE BY MOUTH DAILY. 90 capsule 1   donepezil (ARICEPT) 10 MG tablet TAKE ONE TABLET BY MOUTH AT BEDTIME. 90 tablet 3   estradiol (ESTRACE) 0.1 MG/GM vaginal cream Use 1 gram per vagina with pessary care in the office. 42.5 g 0   fluticasone (FLONASE) 50 MCG/ACT nasal spray Place into both nostrils daily.     ibuprofen (ADVIL,MOTRIN) 200 MG tablet Take 400 mg by mouth daily as needed for moderate pain.     levothyroxine (SYNTHROID) 100 MCG tablet TAKE 1 TABLET BY MOUTH ONCE DAILY 30 MINUTES BEFORE BREAKFAST 90 tablet 3   memantine (NAMENDA) 10 MG tablet TAKE 1 TABLET BY MOUTH 2 TIMES DAILY. 180 tablet 3   rosuvastatin (CRESTOR) 10 MG tablet TAKE 1 TABLET BY MOUTH DAILY. 90 tablet 3   No current facility-administered medications on file prior to visit.    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    Dementia without behavioral disturbance ([facility]) 06/10/2020   History of colonic polyps    History of kidney stones    Hypothyroidism    Liver cyst    Nephrolithiasis    Osteoporosis     Past Surgical History:  Procedure Laterality Date   ABLATION     BACK SURGERY     [phone]   ELECTROPHYSIOLOGIC STUDY N/A 07/22/2015   Procedure: SVT Ablation;  Surgeon: Will Meredith Leeds, MD;  Location: [facility];  Service: Cardiovascular;  Laterality: N/A;   PARTIAL COLECTOMY  2005   SMALL INTESTINE SURGERY     THYROIDECTOMY  2008   TOTAL HIP ARTHROPLASTY Right 08/19/2017   Procedure: RIGHT TOTAL HIP ARTHROPLASTY ANTERIOR APPROACH;  Surgeon: Rod Can, MD;  Location: [facility];  Service: Orthopedics;  Laterality: Right;    Family History  Problem Relation Age of Onset   Stroke Mother    Alzheimer's disease Mother    Heart failure Brother    Alzheimer's disease Sister    Other Father        natural causes   Heart attack Brother    Colon cancer Other        nephew   Ulcerative colitis Sister        colon removed    Social History   Socioeconomic History  Marital status: Divorced    Spouse name: Not on file   Number of children: 1   Years of education: HS   Highest education level: Not on file  Occupational History   Occupation: retired Therapist, music: RETIRED  Tobacco Use   Smoking status: Never   Smokeless tobacco: Never  Vaping Use   Vaping Use: Never used  Substance and Sexual Activity   Alcohol use: No    Alcohol/week: 0.0 standard drinks of alcohol   Drug use: No   Sexual activity: Never  Other Topics Concern   Not on file  Social History Narrative   Regular exercise--no   Lives at [facility].   Daughter = [name]   Right-handed.   1 cup caffeine daily.               Social Determinants of Health   Financial Resource Strain: Not on file  Food Insecurity: Not on file  Transportation  Needs: Not on file  Physical Activity: Not on file  Stress: Not on file  Social Connections: Not on file  Intimate Partner Violence: Not on file   Review of Systems No N/V Eating okay    Objective:   Physical Exam Constitutional:      Appearance: Normal appearance.  HENT:     Right Ear: Tympanic membrane and ear canal normal.     Left Ear: Tympanic membrane and ear canal normal.     Nose: Congestion present.     Mouth/Throat:     Pharynx: No oropharyngeal exudate or posterior oropharyngeal erythema.  Pulmonary:     Effort: Pulmonary effort is normal.     Breath sounds: No rales.     Comments: Slightly decreased breath sounds with prolonged expiratory phase and mild exp wheezes Musculoskeletal:     Cervical back: Neck supple.  Lymphadenopathy:     Cervical: No cervical adenopathy.  Neurological:     Mental Status: She is alert.            Assessment & Plan:

## 2022-08-27 ENCOUNTER — Ambulatory Visit: Payer: Medicare Other | Admitting: Obstetrics and Gynecology

## 2022-10-23 ENCOUNTER — Ambulatory Visit: Payer: Medicare Other | Admitting: Podiatry

## 2022-10-30 ENCOUNTER — Ambulatory Visit (INDEPENDENT_AMBULATORY_CARE_PROVIDER_SITE_OTHER): Payer: Medicare Other | Admitting: Podiatry

## 2022-10-30 ENCOUNTER — Encounter: Payer: Self-pay | Admitting: Podiatry

## 2022-10-30 DIAGNOSIS — B351 Tinea unguium: Secondary | ICD-10-CM | POA: Diagnosis not present

## 2022-10-30 DIAGNOSIS — M79676 Pain in unspecified toe(s): Secondary | ICD-10-CM | POA: Diagnosis not present

## 2022-10-30 NOTE — Progress Notes (Signed)
This patient presents to the office with chief complaint of long thick painful nails.  Patient says the nails are painful walking and wearing shoes.  This patient is unable to self treat.  This patient is unable to trim her nails since she is unable to reach her nails.  She presents to the office for preventative foot care services.  General Appearance  Alert, conversant and in no acute stress.  Vascular  Dorsalis pedis and posterior tibial  pulses are palpable  bilaterally.  Capillary return is within normal limits  bilaterally. Temperature is within normal limits  bilaterally.  Neurologic  Senn-Weinstein monofilament wire test within normal limits  bilaterally. Muscle power within normal limits bilaterally.  Nails Thick disfigured discolored nails with subungual debris  hallux nails  bilaterally. No evidence of bacterial infection or drainage bilaterally.  Orthopedic  No limitations of motion  feet .  No crepitus or effusions noted.  No bony pathology or digital deformities noted.  Skin  normotropic skin with no porokeratosis noted bilaterally.  No signs of infections or ulcers noted.     Onychomycosis  Nails  B/L.  Pain in right toes  Pain in left toes  Debridement of nails both feet followed trimming the nails with dremel tool.    RTC 12   weeks    Helane Gunther DPM

## 2022-11-06 NOTE — Progress Notes (Signed)
GYNECOLOGY  VISIT   HPI: 77 y.o.   Divorced  Caucasian  female   G1P1 with No LMP recorded. Patient is postmenopausal.   here for  3 mo pessary check  No pain or bleeding.   Pessary stays in placed.   Voiding well and having regular bowel movements.  GYNECOLOGIC HISTORY: No LMP recorded. Patient is postmenopausal. 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   Dementia without behavioral disturbance (HCC) 06/10/2020   History of colonic polyps    History of kidney stones    Hypothyroidism    Liver cyst    Nephrolithiasis    Osteoporosis     Past Surgical History:  Procedure Laterality Date   ABLATION     BACK SURGERY     [phone]   ELECTROPHYSIOLOGIC STUDY N/A 07/22/2015   Procedure: SVT Ablation;  Surgeon: Will Jorja Loa, MD;  Location: [facility];  Service: Cardiovascular;  Laterality: N/A;   PARTIAL  COLECTOMY  2005   SMALL INTESTINE SURGERY     THYROIDECTOMY  2008   TOTAL HIP ARTHROPLASTY Right 08/19/2017   Procedure: RIGHT TOTAL HIP ARTHROPLASTY ANTERIOR APPROACH;  Surgeon: Samson Frederic, MD;  Location: [facility];  Service: Orthopedics;  Laterality: Right;    Current Outpatient Medications  Medication Sig Dispense Refill   alendronate (FOSAMAX) 70 MG tablet TAKE 1 TABLET BY MOUTH EVERY 7 DAYS. TAKE WITH A FULL GLASS OF WATER ON AN EMPTY STOMACH. 12 tablet 3   Calcium Carbonate-Vitamin D (CALTRATE 600+D PO) Take 2 tablets by mouth daily.     celecoxib (CELEBREX) 200 MG capsule TAKE 1 CAPSULE BY MOUTH DAILY. 90 capsule 1   donepezil (ARICEPT) 10 MG tablet TAKE ONE TABLET BY MOUTH AT BEDTIME. 90 tablet 3   doxycycline (VIBRA-TABS) 100 MG tablet Take 1 tablet (100 mg total) by mouth 2 (two) times daily. 14 tablet 0   estradiol (ESTRACE) 0.1 MG/GM vaginal cream Use 1 gram per vagina with pessary care in the office. 42.5 g 0   fluticasone (FLONASE) 50 MCG/ACT nasal spray Place into both nostrils daily.  ibuprofen (ADVIL,MOTRIN) 200 MG tablet Take 400 mg by mouth daily as needed for moderate pain.     levothyroxine (SYNTHROID) 100 MCG tablet TAKE 1 TABLET BY MOUTH ONCE DAILY 30 MINUTES BEFORE BREAKFAST 90 tablet 3   memantine (NAMENDA) 10 MG tablet TAKE 1 TABLET BY MOUTH 2 TIMES DAILY. 180 tablet 3   predniSONE (DELTASONE) 20 MG tablet Take 2 tablets (40 mg total) by mouth daily. For 3 days then 1 tab daily for 3 days 9 tablet 0   rosuvastatin (CRESTOR) 10 MG tablet TAKE 1 TABLET BY MOUTH DAILY. 90 tablet 3   No current facility-administered medications for this visit.     ALLERGIES: Patient has no known allergies.  Family History  Problem Relation Age of Onset   Stroke Mother    Alzheimer's disease Mother    Heart failure Brother    Alzheimer's disease Sister    Other Father        natural causes   Heart attack Brother    Colon cancer Other        nephew   Ulcerative colitis  Sister        colon removed    Social History   Socioeconomic History   Marital status: Divorced    Spouse name: Not on file   Number of children: 1   Years of education: HS   Highest education level: Not on file  Occupational History   Occupation: retired Advice worker: RETIRED  Tobacco Use   Smoking status: Never   Smokeless tobacco: Never  Vaping Use   Vaping Use: Never used  Substance and Sexual Activity   Alcohol use: No    Alcohol/week: 0.0 standard drinks of alcohol   Drug use: No   Sexual activity: Never  Other Topics Concern   Not on file  Social History Narrative   Regular exercise--no   [facility]   Daughter = Dub Amis   Right-handed.   1 cup caffeine daily.               Social Determinants of Health   Financial Resource Strain: Not on file  Food Insecurity: Not on file  Transportation Needs: Not on file  Physical Activity: Not on file  Stress: Not on file  Social Connections: Not on file  Intimate Partner Violence: Not on file    Review of Systems  All other systems reviewed and are negative.   PHYSICAL EXAMINATION:    BP 126/88 (BP Location: Right Arm, Patient Position: Sitting, Cuff Size: Normal)   Pulse (!) 42   Ht 5\' 4"  (1.626 m)   SpO2 98%   BMI 31.41 kg/m     General appearance: alert, cooperative and appears stated age    Pelvic: External genitalia:  no lesions              Urethra:  normal appearing urethra with no masses, tenderness or lesions              Bartholins and Skenes: normal                 Vagina: normal appearing vagina with normal color and discharge, minor erythema of posterior vaginal cuff.               Cervix: no lesions                Bimanual Exam:  Uterus:  normal size, contour, position, consistency, mobility,  non-tender              Adnexa: no mass, fullness, tenderness         Pessary ring with support removed, cleansed and replaced with  Estrace cream 1 gram.   Chaperone was present for exam:  Gwenith Spitz, CMA  ASSESSMENT  Incomplete uterovaginal prolapse.   Pessary maintenance.  PLAN  Continue pessary care.  Return in 3 months for next pessary check.    10 min  total time was spent for this patient encounter, including preparation, face-to-face counseling with the patient, coordination of care, and documentation of the encounter.

## 2022-11-17 ENCOUNTER — Ambulatory Visit: Payer: Medicare Other | Admitting: Obstetrics and Gynecology

## 2022-11-19 ENCOUNTER — Encounter: Payer: Self-pay | Admitting: Obstetrics and Gynecology

## 2022-11-19 ENCOUNTER — Ambulatory Visit (INDEPENDENT_AMBULATORY_CARE_PROVIDER_SITE_OTHER): Payer: Medicare Other | Admitting: Obstetrics and Gynecology

## 2022-11-19 ENCOUNTER — Ambulatory Visit: Payer: Medicare Other | Admitting: Obstetrics and Gynecology

## 2022-11-19 VITALS — BP 126/88 | HR 42 | Ht 64.0 in

## 2022-11-19 DIAGNOSIS — Z4689 Encounter for fitting and adjustment of other specified devices: Secondary | ICD-10-CM | POA: Diagnosis not present

## 2022-11-19 DIAGNOSIS — N812 Incomplete uterovaginal prolapse: Secondary | ICD-10-CM

## 2022-12-08 NOTE — Progress Notes (Unsigned)
    Honor Frison T. Senovia Gauer, MD, CAQ [facility] at [facility] [address] [place], [address]  Phone: [phone]  FAX: [phone]  Alyzon Stapel - 77 y.o. female  MRN [id number]  Date of Birth: 07-13-1946  Date: 12/09/2022  PCP: Hannah Beat, MD  Referral: Hannah Beat, MD  No chief complaint on file.  Subjective:   Dreonna Praytor is a 77 y.o. very pleasant female patient with There is no height or weight on file to calculate BMI. who presents with the following:  Patient presents with coughing and wheezing.    Review of Systems is noted in the HPI, as appropriate  Objective:   There were no vitals taken for this visit.  GEN: No acute distress; alert,appropriate. PULM: Breathing comfortably in no respiratory distress PSYCH: Normally interactive.   Laboratory and Imaging Data:  Assessment and Plan:   ***

## 2022-12-09 ENCOUNTER — Ambulatory Visit (INDEPENDENT_AMBULATORY_CARE_PROVIDER_SITE_OTHER): Payer: Medicare Other | Admitting: Family Medicine

## 2022-12-09 ENCOUNTER — Encounter: Payer: Self-pay | Admitting: Family Medicine

## 2022-12-09 VITALS — BP 104/62 | HR 87 | Temp 97.6°F | Ht 64.0 in | Wt 195.0 lb

## 2022-12-09 DIAGNOSIS — J069 Acute upper respiratory infection, unspecified: Secondary | ICD-10-CM

## 2022-12-09 NOTE — Patient Instructions (Signed)
Assisted Living Order:  Stop scheduled Celebrex (celecoxib)  Continue to use Tylenol as needed

## 2022-12-10 ENCOUNTER — Other Ambulatory Visit: Payer: Self-pay | Admitting: Family Medicine

## 2023-02-01 ENCOUNTER — Ambulatory Visit (INDEPENDENT_AMBULATORY_CARE_PROVIDER_SITE_OTHER): Payer: Medicare Other | Admitting: Podiatry

## 2023-02-01 ENCOUNTER — Encounter: Payer: Self-pay | Admitting: Podiatry

## 2023-02-01 DIAGNOSIS — M79609 Pain in unspecified limb: Secondary | ICD-10-CM

## 2023-02-01 DIAGNOSIS — B351 Tinea unguium: Secondary | ICD-10-CM | POA: Diagnosis not present

## 2023-02-01 DIAGNOSIS — F0390 Unspecified dementia without behavioral disturbance: Secondary | ICD-10-CM

## 2023-02-01 NOTE — Progress Notes (Signed)
This patient presents to the office with chief complaint of long thick painful nails.  Patient says the nails are painful walking and wearing shoes.  This patient is unable to self treat.  This patient is unable to trim her nails since she is unable to reach her nails.  She presents to the office for preventative foot care services.  General Appearance  Alert, conversant and in no acute stress.  Vascular  Dorsalis pedis and posterior tibial  pulses are palpable  bilaterally.  Capillary return is within normal limits  bilaterally. Temperature is within normal limits  bilaterally.  Neurologic  Senn-Weinstein monofilament wire test within normal limits  bilaterally. Muscle power within normal limits bilaterally.  Nails Thick disfigured discolored nails with subungual debris  hallux nails  bilaterally. No evidence of bacterial infection or drainage bilaterally.  Orthopedic  No limitations of motion  feet .  No crepitus or effusions noted.  No bony pathology or digital deformities noted.  Skin  normotropic skin with no porokeratosis noted bilaterally.  No signs of infections or ulcers noted.     Onychomycosis  Nails  B/L.  Pain in right toes  Pain in left toes  Debridement of nails both feet followed trimming the nails with dremel tool.    RTC 12   weeks    Helane Gunther DPM

## 2023-02-08 NOTE — Progress Notes (Deleted)
GYNECOLOGY  VISIT   HPI: 77 y.o.   Divorced  Caucasian  female   G1P1 with No LMP recorded. Patient is postmenopausal.   here for   3 mo pessary check  GYNECOLOGIC HISTORY: No LMP recorded. Patient is postmenopausal. 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   Dementia without behavioral disturbance (HCC) 06/10/2020   History of colonic polyps    History of kidney stones    Hypothyroidism    Liver cyst    Nephrolithiasis    Osteoporosis     Past Surgical History:  Procedure Laterality Date   ABLATION     BACK SURGERY     [phone]   ELECTROPHYSIOLOGIC STUDY N/A 07/22/2015   Procedure: SVT Ablation;  Surgeon: Will Jorja Loa, MD;  Location: [facility];  Service: Cardiovascular;  Laterality: N/A;   PARTIAL COLECTOMY  2005   SMALL INTESTINE SURGERY     THYROIDECTOMY  2008   TOTAL HIP ARTHROPLASTY Right 08/19/2017   Procedure: RIGHT TOTAL HIP ARTHROPLASTY ANTERIOR  APPROACH;  Surgeon: Samson Frederic, MD;  Location: [facility];  Service: Orthopedics;  Laterality: Right;    Current Outpatient Medications  Medication Sig Dispense Refill   Calcium Carbonate-Vitamin D (CALTRATE 600+D PO) Take 2 tablets by mouth daily.     celecoxib (CELEBREX) 200 MG capsule TAKE 1 CAPSULE BY MOUTH DAILY. 90 capsule 1   donepezil (ARICEPT) 10 MG tablet TAKE ONE TABLET BY MOUTH AT BEDTIME. 90 tablet 3   estradiol (ESTRACE) 0.1 MG/GM vaginal cream Use 1 gram per vagina with pessary care in the office. 42.5 g 0   fluticasone (FLONASE) 50 MCG/ACT nasal spray Place 1 spray into both nostrils daily as needed for allergies or rhinitis.     ibuprofen (ADVIL,MOTRIN) 200 MG tablet Take 400 mg by mouth daily as needed for moderate pain.     levothyroxine (SYNTHROID) 100 MCG tablet TAKE 1 TABLET BY MOUTH ONCE DAILY 30 MINUTES BEFORE BREAKFAST 90 tablet 3   memantine (NAMENDA) 10 MG tablet TAKE 1 TABLET BY MOUTH 2 TIMES DAILY. 180 tablet 1   rosuvastatin (CRESTOR) 10 MG tablet TAKE 1 TABLET BY MOUTH DAILY. 90 tablet  3   No current facility-administered medications for this visit.     ALLERGIES: Patient has no known allergies.  Family History  Problem Relation Age of Onset   Stroke Mother    Alzheimer's disease Mother    Heart failure Brother    Alzheimer's disease Sister    Other Father        natural causes   Heart attack Brother    Colon cancer Other        nephew   Ulcerative colitis Sister        colon removed    Social History   Socioeconomic History   Marital status: Divorced    Spouse name: Not on file   Number of children: 1   Years of education: HS   Highest education level: Not on file  Occupational History   Occupation: retired Advice worker: RETIRED  Tobacco Use   Smoking status: Never   Smokeless tobacco: Never  Vaping Use   Vaping status: Never Used  Substance and Sexual Activity   Alcohol use: No    Alcohol/week:  0.0 standard drinks of alcohol   Drug use: No   Sexual activity: Never  Other Topics Concern   Not on file  Social History Narrative   Regular exercise--no   [facility]   Daughter = Dub Amis   Right-handed.   1 cup caffeine daily.               Social Determinants of Health   Financial Resource Strain: Not on file  Food Insecurity: Not on file  Transportation Needs: Not on file  Physical Activity: Not on file  Stress: Not on file  Social Connections: Not on file  Intimate Partner Violence: Not on file    Review of Systems  PHYSICAL EXAMINATION:    There were no vitals taken for this visit.    General appearance: alert, cooperative and appears stated age Head: Normocephalic, without obvious abnormality, atraumatic Neck: no adenopathy, supple, symmetrical, trachea midline and thyroid normal to inspection and palpation Lungs: clear to auscultation bilaterally Breasts: normal appearance, no masses or tenderness, No nipple retraction or dimpling, No nipple discharge or bleeding, No axillary or supraclavicular adenopathy Heart: regular rate and rhythm Abdomen: soft, non-tender, no masses,  no organomegaly Extremities: extremities normal, atraumatic, no cyanosis or edema Skin: Skin color, texture, turgor normal. No rashes or lesions Lymph nodes: Cervical, supraclavicular, and axillary nodes normal. No abnormal inguinal nodes palpated Neurologic: Grossly normal  Pelvic: External genitalia:  no lesions              Urethra:  normal appearing urethra with no masses, tenderness or lesions              Bartholins and Skenes: normal                 Vagina: normal appearing vagina with normal color and discharge, no lesions              Cervix: no lesions                Bimanual Exam:  Uterus:  normal size, contour, position, consistency, mobility, non-tender              Adnexa: no mass, fullness, tenderness              Rectal exam: {yes no:[id number]}.   Confirms.  Anus:  normal sphincter tone, no lesions  Chaperone was present for exam:  ***  ASSESSMENT     PLAN     An After Visit Summary was printed and given to the patient.  ______ minutes face to face time of which over 50% was spent in counseling.

## 2023-02-22 ENCOUNTER — Ambulatory Visit: Payer: Medicare Other | Admitting: Obstetrics and Gynecology

## 2023-02-26 NOTE — Progress Notes (Signed)
GYNECOLOGY  VISIT   HPI: 77 y.o.   Divorced  Caucasian  female   G1P1 with No LMP recorded. Patient is postmenopausal.   here for   3 mo pessary check. Patient's daughter brought her to the office today.   No pain, bleeding, or discharge per patient.   Voiding well and no problems with bowel movements.   GYNECOLOGIC HISTORY: No LMP recorded. Patient is postmenopausal. 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   Dementia without behavioral disturbance (HCC) 06/10/2020   History of colonic polyps    History of kidney stones    Hypothyroidism    Liver cyst    Nephrolithiasis    Osteoporosis     Past Surgical History:  Procedure Laterality Date   ABLATION     BACK SURGERY     [phone]   ELECTROPHYSIOLOGIC STUDY N/A 07/22/2015   Procedure: SVT Ablation;  Surgeon: Will Jorja Loa, MD;  Location: [facility];  Service: Cardiovascular;  Laterality: N/A;   PARTIAL COLECTOMY   2005   SMALL INTESTINE SURGERY     THYROIDECTOMY  2008   TOTAL HIP ARTHROPLASTY Right 08/19/2017   Procedure: RIGHT TOTAL HIP ARTHROPLASTY ANTERIOR APPROACH;  Surgeon: Samson Frederic, MD;  Location: [facility];  Service: Orthopedics;  Laterality: Right;    Current Outpatient Medications  Medication Sig Dispense Refill   Calcium Carbonate-Vitamin D (CALTRATE 600+D PO) Take 2 tablets by mouth daily.     celecoxib (CELEBREX) 200 MG capsule TAKE 1 CAPSULE BY MOUTH DAILY. 90 capsule 1   donepezil (ARICEPT) 10 MG tablet TAKE ONE TABLET BY MOUTH AT BEDTIME. 90 tablet 3   estradiol (ESTRACE) 0.1 MG/GM vaginal cream Use 1 gram per vagina with pessary care in the office. 42.5 g 0   fluticasone (FLONASE) 50 MCG/ACT nasal spray Place 1 spray into both nostrils daily as needed for allergies or rhinitis.     ibuprofen (ADVIL,MOTRIN) 200 MG tablet Take 400 mg by mouth daily as needed for moderate pain.     levothyroxine (SYNTHROID) 100 MCG tablet TAKE 1 TABLET BY MOUTH ONCE DAILY 30 MINUTES BEFORE BREAKFAST 90 tablet 3   memantine (NAMENDA) 10  MG tablet TAKE 1 TABLET BY MOUTH 2 TIMES DAILY. 180 tablet 1   rosuvastatin (CRESTOR) 10 MG tablet TAKE 1 TABLET BY MOUTH DAILY. 90 tablet 3   No current facility-administered medications for this visit.     ALLERGIES: Patient has no known allergies.  Family History  Problem Relation Age of Onset   Stroke Mother    Alzheimer's disease Mother    Heart failure Brother    Alzheimer's disease Sister    Other Father        natural causes   Heart attack Brother    Colon cancer Other        nephew   Ulcerative colitis Sister        colon removed    Social History   Socioeconomic History   Marital status: Divorced    Spouse name: Not on file   Number of children: 1   Years of education: HS   Highest education level: Not on file  Occupational History   Occupation: retired Advice worker: RETIRED  Tobacco Use   Smoking  status: Never   Smokeless tobacco: Never  Vaping Use   Vaping status: Never Used  Substance and Sexual Activity   Alcohol use: No    Alcohol/week: 0.0 standard drinks of alcohol   Drug use: No   Sexual activity: Never  Other Topics Concern   Not on file  Social History Narrative   Regular exercise--no   [facility]ives at [facility].   Daughter = Dub Amis   Right-handed.   1 cup caffeine daily.               Social Determinants of Health   Financial Resource Strain: Not on file  Food Insecurity: Not on file  Transportation Needs: Not on file  Physical Activity: Not on file  Stress: Not on file  Social Connections: Not on file  Intimate Partner Violence: Not on file    Review of Systems  All other systems reviewed and are negative.   PHYSICAL EXAMINATION:    BP 122/82 (BP Location: Left Arm, Patient Position: Sitting, Cuff Size: Normal)   Pulse 78   Ht 5\' 4"  (1.626 m)   Wt 202 lb (91.6 kg)   SpO2 96%   BMI 34.67 kg/m     General appearance: alert, cooperative and appears stated age   Pelvic: External genitalia:  no lesions              Urethra:  normal appearing urethra with no masses, tenderness or lesions              Bartholins and Skenes: normal                 Vagina: inflammation along posterior vaginal apex from sidewall to sidewall.               Cervix: no lesions                Bimanual Exam:  Uterus:  normal size, contour, position, consistency, mobility, non-tender              Adnexa: no mass, fullness, tenderness          Pessary removed, cleansed, and replaced with estradiol cream 1 gram.   Chaperone was present for exam:  Warren Lacy, CMA  ASSESSMENT  Incomplete uterovaginal prolapse.  Pessary maintenance.  Inflammation from pessary.    PLAN  Ok to continue pessary care.  Follow up in 4 weeks for pessary recheck.  Bring estradiol cream with her.  Care plan reviewed with her daughter today.

## 2023-03-08 ENCOUNTER — Ambulatory Visit (INDEPENDENT_AMBULATORY_CARE_PROVIDER_SITE_OTHER): Payer: Medicare Other | Admitting: Obstetrics and Gynecology

## 2023-03-08 ENCOUNTER — Encounter: Payer: Self-pay | Admitting: Obstetrics and Gynecology

## 2023-03-08 VITALS — BP 122/82 | HR 78 | Ht 64.0 in | Wt 202.0 lb

## 2023-03-08 DIAGNOSIS — N760 Acute vaginitis: Secondary | ICD-10-CM | POA: Diagnosis not present

## 2023-03-08 DIAGNOSIS — T8369XA Infection and inflammatory reaction due to other prosthetic device, implant and graft in genital tract, initial encounter: Secondary | ICD-10-CM | POA: Diagnosis not present

## 2023-03-08 DIAGNOSIS — Z4689 Encounter for fitting and adjustment of other specified devices: Secondary | ICD-10-CM | POA: Diagnosis not present

## 2023-03-08 DIAGNOSIS — N812 Incomplete uterovaginal prolapse: Secondary | ICD-10-CM

## 2023-03-23 NOTE — Progress Notes (Signed)
GYNECOLOGY  VISIT   HPI: 77 y.o.   Divorced  Caucasian  female   G1P1 with No LMP recorded. Patient is postmenopausal.   here for Pessary recheck. Patient daughter states that she has frequency of urination. UA ordered.  No vaginal pain or bleeding.   No painful urination.   GYNECOLOGIC HISTORY: No LMP recorded. Patient is postmenopausal. 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   Dementia without behavioral disturbance (HCC) 06/10/2020   History of colonic polyps    History of kidney stones    Hypothyroidism    Liver cyst    Nephrolithiasis    Osteoporosis     Past Surgical History:  Procedure Laterality Date   ABLATION     BACK SURGERY     [phone]   ELECTROPHYSIOLOGIC STUDY N/A 07/22/2015   Procedure: SVT Ablation;  Surgeon: Will Jorja Loa, MD;  Location: [facility];  Service: Cardiovascular;  Laterality: N/A;   PARTIAL COLECTOMY  2005   SMALL INTESTINE  SURGERY     THYROIDECTOMY  2008   TOTAL HIP ARTHROPLASTY Right 08/19/2017   Procedure: RIGHT TOTAL HIP ARTHROPLASTY ANTERIOR APPROACH;  Surgeon: Samson Frederic, MD;  Location: [facility];  Service: Orthopedics;  Laterality: Right;    Current Outpatient Medications  Medication Sig Dispense Refill   Calcium Carbonate-Vitamin D (CALTRATE 600+D PO) Take 2 tablets by mouth daily.     celecoxib (CELEBREX) 200 MG capsule TAKE 1 CAPSULE BY MOUTH DAILY. 90 capsule 1   donepezil (ARICEPT) 10 MG tablet TAKE ONE TABLET BY MOUTH AT BEDTIME. 90 tablet 3   estradiol (ESTRACE) 0.1 MG/GM vaginal cream Use 1 gram per vagina with pessary care in the office. 42.5 g 0   fluticasone (FLONASE) 50 MCG/ACT nasal spray Place 1 spray into both nostrils daily as needed for allergies or rhinitis.     ibuprofen (ADVIL,MOTRIN) 200 MG tablet Take 400 mg by mouth daily as needed for moderate pain.     levothyroxine (SYNTHROID) 100 MCG tablet TAKE 1 TABLET BY MOUTH ONCE DAILY 30 MINUTES BEFORE BREAKFAST 90 tablet 3   memantine (NAMENDA) 10 MG tablet TAKE 1 TABLET BY MOUTH  2 TIMES DAILY. 180 tablet 1   rosuvastatin (CRESTOR) 10 MG tablet TAKE 1 TABLET BY MOUTH DAILY. 90 tablet 3   No current facility-administered medications for this visit.     ALLERGIES: Patient has no known allergies.  Family History  Problem Relation Age of Onset   Stroke Mother    Alzheimer's disease Mother    Heart failure Brother    Alzheimer's disease Sister    Other Father        natural causes   Heart attack Brother    Colon cancer Other        nephew   Ulcerative colitis Sister        colon removed    Social History   Socioeconomic History   Marital status: Divorced    Spouse name: Not on file   Number of children: 1   Years of education: HS   Highest education level: Not on file  Occupational History   Occupation: retired Advice worker: RETIRED  Tobacco Use   Smoking status: Never   Smokeless  tobacco: Never  Vaping Use   Vaping status: Never Used  Substance and Sexual Activity   Alcohol use: No    Alcohol/week: 0.0 standard drinks of alcohol   Drug use: No   Sexual activity: Never  Other Topics Concern   Not on file  Social History Narrative   Regular exercise--no   Lives at [facility].   Daughter = Dub Amis   Right-handed.   1 cup caffeine daily.               Social Determinants of Health   Financial Resource Strain: Low Risk  (04/06/2023)   Overall Financial Resource Strain (CARDIA)    Difficulty of Paying Living Expenses: Not hard at all  Food Insecurity: No Food Insecurity (04/06/2023)   Hunger Vital Sign    Worried About Running Out of Food in the Last Year: Never true    Ran Out of Food in the Last Year: Never true  Transportation Needs: No Transportation Needs (04/06/2023)   PRAPARE - Administrator, Civil Service (Medical): No    Lack of Transportation (Non-Medical): No  Physical Activity: Insufficiently Active (04/06/2023)   Exercise Vital Sign    Days of Exercise per Week: 3 days    Minutes of Exercise per Session: 30 min  Stress: No Stress Concern Present (04/06/2023)   [employer] of Occupational Health - Occupational Stress Questionnaire    Feeling of Stress : Not at all  Social Connections: Moderately Isolated (04/06/2023)   Social Connection and Isolation Panel [NHANES]    Frequency of Communication with Friends and Family: Never    Frequency of Social Gatherings with Friends and Family: More than three times a week    Attends Religious Services: Never    Database administrator or Organizations: Yes    Attends Engineer, structural: More than 4 times per year    Marital Status: Widowed  Intimate Partner Violence: Not At Risk (04/06/2023)   Humiliation, Afraid, Rape, and Kick questionnaire    Fear of Current or Ex-Partner: No    Emotionally Abused: No    Physically Abused: No    Sexually Abused: No     Review of Systems  Genitourinary:  Positive for frequency.  All other systems reviewed and are negative.   PHYSICAL EXAMINATION:    BP 128/74   Pulse 66   Wt  202 lb (91.6 kg)   SpO2 100%   BMI 34.67 kg/m     General appearance: alert, cooperative and appears stated age Head: Normocephalic, without obvious abnormality, atraumatic   Pelvic: External genitalia:  no lesions              Urethra:  normal appearing urethra with no masses, tenderness or lesions              Bartholins and Skenes: normal                 Vagina: normal appearing vagina with normal color and discharge, 2 x 1 cm ulceration posterior vaginal cuff on right.               Cervix: no lesions                Bimanual Exam:  Uterus:  normal size, contour, position, consistency, mobility, non-tender              Adnexa: no mass, fullness, tenderness         Pessary removed, cleansed and given to patient in a biobag.   Estradiol cream 1 gram placed in vagina.  Chaperone was present for exam:  Gwenith Spitz, CMA  ASSESSMENT  Incomplete uterovaginal prolapse.  Pessary maintenance.  Inflammation from pessary.  Urinary frequency.     PLAN  Will keep pessary out for 2 weeks.  Urinalysis:  sg 1.020, ph 6.0, 40 - 60 WBC, 3 - 10 RBC, many bacteria.  UC sent.  Will await final UC to determine if needs treatment, as the vaginal drainage may cause her urinalysis to be positive.  Fu in about 2 weeks.  Plan of care discussed with the patient and the patient's daughter.   20 min  total time was spent for this patient encounter, including preparation, face-to-face counseling with the patient, coordination of care, and documentation of the encounter.

## 2023-04-06 ENCOUNTER — Encounter: Payer: Self-pay | Admitting: Obstetrics and Gynecology

## 2023-04-06 ENCOUNTER — Ambulatory Visit: Payer: Medicare Other | Admitting: Obstetrics and Gynecology

## 2023-04-06 ENCOUNTER — Ambulatory Visit (INDEPENDENT_AMBULATORY_CARE_PROVIDER_SITE_OTHER): Payer: Medicare Other | Admitting: *Deleted

## 2023-04-06 VITALS — BP 128/74 | HR 66 | Wt 202.0 lb

## 2023-04-06 DIAGNOSIS — N765 Ulceration of vagina: Secondary | ICD-10-CM | POA: Diagnosis not present

## 2023-04-06 DIAGNOSIS — R350 Frequency of micturition: Secondary | ICD-10-CM | POA: Diagnosis not present

## 2023-04-06 DIAGNOSIS — Z0000 Encounter for general adult medical examination without abnormal findings: Secondary | ICD-10-CM | POA: Diagnosis not present

## 2023-04-06 DIAGNOSIS — Z4689 Encounter for fitting and adjustment of other specified devices: Secondary | ICD-10-CM

## 2023-04-06 NOTE — Progress Notes (Signed)
Subjective:   Monica Irwin is a 77 y.o. female who presents for Medicare Annual (Subsequent) preventive examination.  Visit Complete: Virtual  I connected with  Monica Irwin patients daughter Monica Irwin assisted in visit on 04/06/23 by a audio enabled telemedicine application and verified that I am speaking with the correct person using two identifiers.  Patient Location: Home/skilled nursing facility  Provider Location: Home Office  I discussed the limitations of evaluation and management by telemedicine. The patient expressed understanding and agreed to proceed.  Vital Signs: Unable to obtain new vitals due to this being a telehealth visit.   Cardiac Risk Factors include: advanced age (>58men, >61 women)     Objective:    There were no vitals filed for this visit. There is no height or weight on file to calculate BMI.     04/06/2023    3:10 PM 08/19/2017    4:03 PM 08/03/2017    1:23 PM 05/24/2017    2:06 PM 11/11/2015   11:13 AM 07/22/2015    9:43 AM 04/17/2015   12:16 PM  Advanced Directives  Does Patient Have a Medical Advance Directive? Yes Yes Yes Yes Yes Yes No  Type of Advance Directive Living will;Healthcare Power of [employer] Power of [place];Living will Healthcare Power of [place];Living will Healthcare Power of [place];Living will Healthcare Power of [place];Living will Healthcare Power of [place];Living will   Does patient want to make changes to medical advance directive?  No - Patient declined   No - Patient declined    Copy of Healthcare Power of Attorney in Chart? Yes - validated most recent copy scanned in chart (See row information) No - copy requested No - copy requested No - copy requested No - copy requested No - copy requested   Would patient like information on creating a medical advance directive? No - Patient declined      No - patient declined information    Current Medications (verified) Outpatient Encounter Medications  as of 04/06/2023  Medication Sig   Calcium Carbonate-Vitamin D (CALTRATE 600+D PO) Take 2 tablets by mouth daily.   donepezil (ARICEPT) 10 MG tablet TAKE ONE TABLET BY MOUTH AT BEDTIME.   estradiol (ESTRACE) 0.1 MG/GM vaginal cream Use 1 gram per vagina with pessary care in the office.   fluticasone (FLONASE) 50 MCG/ACT nasal spray Place 1 spray into both nostrils daily as needed for allergies or rhinitis.   ibuprofen (ADVIL,MOTRIN) 200 MG tablet Take 400 mg by mouth daily as needed for moderate pain.   levothyroxine (SYNTHROID) 100 MCG tablet TAKE 1 TABLET BY MOUTH ONCE DAILY 30 MINUTES BEFORE BREAKFAST   memantine (NAMENDA) 10 MG tablet TAKE 1 TABLET BY MOUTH 2 TIMES DAILY.   rosuvastatin (CRESTOR) 10 MG tablet TAKE 1 TABLET BY MOUTH DAILY.   celecoxib (CELEBREX) 200 MG capsule TAKE 1 CAPSULE BY MOUTH DAILY.   No facility-administered encounter medications on file as of 04/06/2023.    Allergies (verified) Patient has no known allergies.   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   Dementia without behavioral disturbance (HCC) 06/10/2020   History of colonic polyps    History of kidney stones    Hypothyroidism    Liver cyst    Nephrolithiasis    Osteoporosis    Past Surgical History:  Procedure Laterality Date   ABLATION     BACK SURGERY     [phone]   ELECTROPHYSIOLOGIC STUDY N/A 07/22/2015   Procedure: SVT Ablation;  Surgeon: Will Jorja Loa, MD;  Location: [facility];  Service: Cardiovascular;  Laterality: N/A;   PARTIAL COLECTOMY  2005   SMALL INTESTINE SURGERY     THYROIDECTOMY  2008   TOTAL HIP ARTHROPLASTY Right 08/19/2017   Procedure: RIGHT TOTAL HIP ARTHROPLASTY ANTERIOR APPROACH;  Surgeon: Samson Frederic, MD;  Location: [facility];  Service: Orthopedics;  Laterality:  Right;   Family History  Problem Relation Age of Onset   Stroke Mother    Alzheimer's disease Mother    Heart failure Brother    Alzheimer's disease Sister    Other Father        natural causes   Heart attack Brother    Colon cancer Other        nephew   Ulcerative colitis Sister        colon removed   Social History   Socioeconomic History   Marital status: Divorced    Spouse name: Not on file   Number of children: 1   Years of education: HS   Highest education level: Not on file  Occupational History   Occupation: retired Advice worker: RETIRED  Tobacco Use   Smoking status: Never   Smokeless tobacco: Never  Vaping Use   Vaping status: Never Used  Substance and Sexual Activity   Alcohol use: No    Alcohol/week: 0.0 standard drinks of alcohol   Drug use: No   Sexual activity: Never  Other Topics Concern   Not on file  Social History Narrative   Regular exercise--no   Lives at Skilled Nursing home.   Daughter = Monica Irwin   Right-handed.   1 cup caffeine daily.               Social Determinants of Health   Financial Resource Strain: Low Risk  (04/06/2023)   Overall Financial Resource Strain (CARDIA)    Difficulty of Paying Living Expenses: Not hard at all  Food Insecurity: No Food Insecurity (04/06/2023)   Hunger Vital Sign    Worried About Running Out of Food in the Last Year: Never true    Ran Out of Food in the Last Year: Never true  Transportation Needs: No Transportation Needs (04/06/2023)   PRAPARE - Administrator, Civil Service (Medical): No    Lack of Transportation (Non-Medical): No  Physical Activity: Insufficiently Active (04/06/2023)   Exercise Vital Sign    Days of Exercise per Week: 3 days    Minutes of Exercise per Session: 30 min  Stress: No Stress Concern Present (04/06/2023)   [employer] of Occupational Health - Occupational Stress Questionnaire    Feeling of Stress : Not at all   Social Connections: Moderately Isolated (04/06/2023)   Social Connection and Isolation Panel [NHANES]    Frequency of Communication with Friends and Family: Never    Frequency of Social Gatherings with Friends and Family: More than three times a week    Attends Religious Services: Never    Database administrator or Organizations: Yes    Attends Engineer, structural: More than 4 times per year    Marital Status: Widowed    Tobacco Counseling Counseling given: Not Answered   Clinical Intake:  Pre-visit preparation completed: Yes  Pain : No/denies pain  Diabetes: No  How often do you need to have someone help you when you read instructions, pamphlets, or other written materials from your doctor or pharmacy?: 4 - Often  Interpreter Needed?: No  Information entered by :: Remi Haggard LPn   Activities of Daily Living    04/06/2023    3:08 PM  In your present state of health, do you have any difficulty performing the following activities:  Hearing? 0  Vision? 0  Difficulty concentrating or making decisions? 1  Walking or climbing stairs? 1  Dressing or bathing? 1  Doing errands, shopping? 1  Preparing Food and eating ? N  Using the Toilet? N  In the past six months, have you accidently leaked urine? Y  Do you have problems with loss of bowel control? Y  Managing your Medications? Y  Managing your Finances? Y  Housekeeping or managing your Housekeeping? Y    Patient Care Team: Hannah Beat, MD as PCP - General (Family Medicine) Patton Salles, MD as Consulting Physician (Obstetrics and Gynecology)  Indicate any recent Medical Services you may have received from other than [facility] providers in the past year (date may be approximate).     Assessment:   This is a routine wellness examination for [place].  Hearing/Vision screen Hearing Screening - Comments:: No trouble hearing Vision Screening - Comments:: Every two years Unsure of name/   on  [address]   Goals Addressed             This Visit's Progress    Patient Stated       Get some weight off       Depression Screen    04/06/2023    3:11 PM 03/25/2022   11:11 AM 03/19/2021   10:05 AM 03/07/2020    9:30 AM 02/13/2019   10:20 AM 05/24/2017    2:05 PM 03/30/2016   10:42 AM  PHQ 2/9 Scores  PHQ - 2 Score 0 1 0 0 0 0 0  PHQ- 9 Score 2     0     Fall Risk    04/06/2023    3:18 PM 08/12/2022   10:13 AM 03/25/2022   11:10 AM 03/19/2021   10:05 AM 03/07/2020    9:30 AM  Fall Risk   Falls in the past year? 0 0 0 0 0  Number falls in past yr: 0 0     Injury with Fall? 0 0     Risk for fall due to : Impaired balance/gait No Fall Risks;Impaired mobility     Follow up Falls evaluation completed;Education provided;Falls prevention discussed Falls evaluation completed       MEDICARE RISK AT HOME: Medicare Risk at Home Any stairs in or around the home?: No If so, are there any without handrails?: No Home free of loose throw rugs in walkways, pet beds, electrical cords, etc?: Yes Adequate lighting in your home to reduce risk of falls?: Yes Life alert?: Yes Use of a cane, walker or w/c?: Yes Grab bars in the bathroom?: Yes Shower chair or bench in shower?: Yes Elevated toilet seat or a handicapped toilet?: Yes  TIMED UP AND GO:  Was the test performed?  No    Cognitive Function: Patient unable to complete  (due to dementia)     06/10/2020   10:05 AM 06/06/2019   10:34 AM 03/10/2018    3:56 PM 05/24/2017    2:06 PM 03/31/2017   12:16 PM  MMSE - Mini Mental  State Exam  Orientation to time 3 4 5 5 5   Orientation to Place 5 4 5 5 5   Registration 3 3 3 3 3   Attention/ Calculation 0 0 3 0 5  Attention/Calculation-comments Refused both the subtraction and spelling world backwards      Recall 2 1 0 1 2  Recall-comments    unable to recall 2 of 3 words   Language- name 2 objects 2 2 2  0 2  Language- repeat 0 1 1 1 1   Language- follow 3 step command 2 3 3 2 3    Language- follow 3 step command-comments Took paper in her left hand   unable to follow 1 step of 3 step command   Language- read & follow direction 1 1 1  0 1  Write a sentence 1 1 1  0 1  Copy design 1 0 1 0 1  Copy design-comments  named 6 animals     Total score 20 20 25 17 29         Immunizations Immunization History  Administered Date(s) Administered   Fluad Quad(high Dose 65+) 04/06/2019, 03/19/2021, 03/25/2022   Influenza Whole 04/10/2009, 03/13/2010   Influenza, High Dose Seasonal PF 04/01/2018   Influenza,inj,Quad PF,6+ Mos 04/22/2015, 03/30/2016, 05/24/2017   Influenza-Unspecified 04/12/2013, 04/12/2017   PFIZER(Purple Top)SARS-COV-2 Vaccination 08/01/2019, 08/21/2019, 05/26/2020   Pneumococcal Conjugate-13 11/08/2013   Pneumococcal Polysaccharide-23 05/22/2010   Td 07/13/2001   Tdap 01/20/2017   Zoster Recombinant(Shingrix) 02/15/2019, 04/24/2019   Zoster, Live 07/13/2001    TDAP status: Up to date  Flu Vaccine status: Due, Education has been provided regarding the importance of this vaccine. Advised may receive this vaccine at local pharmacy or Health Dept. Aware to provide a copy of the vaccination record if obtained from local pharmacy or Health Dept. Verbalized acceptance and understanding.  Pneumococcal vaccine status: Up to date  Covid-19 vaccine status: Declined, Education has been provided regarding the importance of this vaccine but patient still declined. Advised may receive this vaccine at local pharmacy or Health Dept.or vaccine clinic. Aware to provide a copy of the vaccination record if obtained from local pharmacy or Health Dept. Verbalized acceptance and understanding.  Qualifies for Shingles Vaccine? No   Zostavax completed Yes   Shingrix Completed?: Yes  Screening Tests Health Maintenance  Topic Date Due   COVID-19 Vaccine (4 - 2023-24 season) 09/22/2023 (Originally 03/14/2023)   INFLUENZA VACCINE  10/11/2023 (Originally 02/11/2023)   Colonoscopy   04/05/2024 (Originally 10/29/2020)   Medicare Annual Wellness (AWV)  04/05/2024   DTaP/Tdap/Td (3 - Td or Tdap) 01/21/2027   Pneumonia Vaccine 20+ Years old  Completed   DEXA SCAN  Completed   Hepatitis C Screening  Completed   Zoster Vaccines- Shingrix  Completed   HPV VACCINES  Aged Out    Health Maintenance  There are no preventive care reminders to display for this patient.   Colorectal cancer screening: No longer required.   Mammogram status: Completed  . Repeat every year  Bone Density status: Completed 2023. Results reflect: Bone density results: OSTEOPOROSIS. Repeat every 2 years.  Lung Cancer Screening: (Low Dose CT Chest recommended if Age 6-80 years, 20 pack-year currently smoking OR have quit w/in 15years.) does not qualify.   Lung Cancer Screening Referral:   Additional Screening:  Hepatitis C Screening: does not qualify; Completed 2017  Vision Screening: Recommended annual ophthalmology exams for early detection of glaucoma and other disorders of the eye. Is the patient up to date with their annual eye exam?  Yes  Who is the provider or what is the name of the office in which the patient attends annual eye exams? On [address] unsure of name If pt is not established with a provider, would they like to be referred to a provider to establish care? No .   Dental Screening: Recommended annual dental exams for proper oral hygiene    Community Resource Referral / Chronic Care Management: CRR required this visit?  No   CCM required this visit?  No     Plan:     I have personally reviewed and noted the following in the patient's chart:   Medical and social history Use of alcohol, tobacco or illicit drugs  Current medications and supplements including opioid prescriptions. Patient is not currently taking opioid prescriptions. Functional ability and status Nutritional status Physical activity Advanced directives List of other physicians Hospitalizations,  surgeries, and ER visits in previous 12 months Vitals Screenings to include cognitive, depression, and falls Referrals and appointments  In addition, I have reviewed and discussed with patient certain preventive protocols, quality metrics, and best practice recommendations. A written personalized care plan for preventive services as well as general preventive health recommendations were provided to patient.     Remi Haggard, LPN   [id number]   After Visit Summary: (MyChart) Due to this being a telephonic visit, the after visit summary with patients personalized plan was offered to patient via MyChart   Nurse Notes:

## 2023-04-06 NOTE — Patient Instructions (Signed)
Monica Irwin , Thank you for taking time to come for your Medicare Wellness Visit. I appreciate your ongoing commitment to your health goals. Please review the following plan we discussed and let me know if I can assist you in the future.   Screening recommendations/referrals: Colonoscopy: no longer required Mammogram: up to date Bone Density: up to date Recommended yearly ophthalmology/optometry visit for glaucoma screening and checkup Recommended yearly dental visit for hygiene and checkup  Vaccinations: Influenza vaccine: Education provided Pneumococcal vaccine: up to date Tdap vaccine: up to date      Preventive Care 65 Years and Older, Female Preventive care refers to lifestyle choices and visits with your health care provider that can promote health and wellness. What does preventive care include? A yearly physical exam. This is also called an annual well check. Dental exams once or twice a year. Routine eye exams. Ask your health care provider how often you should have your eyes checked. Personal lifestyle choices, including: Daily care of your teeth and gums. Regular physical activity. Eating a healthy diet. Avoiding tobacco and drug use. Limiting alcohol use. Practicing safe sex. Taking low-dose aspirin every day. Taking vitamin and mineral supplements as recommended by your health care provider. What happens during an annual well check? The services and screenings done by your health care provider during your annual well check will depend on your age, overall health, lifestyle risk factors, and family history of disease. Counseling  Your health care provider may ask you questions about your: Alcohol use. Tobacco use. Drug use. Emotional well-being. Home and relationship well-being. Sexual activity. Eating habits. History of falls. Memory and ability to understand (cognition). Work and work Astronomer. Reproductive health. Screening  You may have the following  tests or measurements: Height, weight, and BMI. Blood pressure. Lipid and cholesterol levels. These may be checked every 5 years, or more frequently if you are over 72 years old. Skin check. Lung cancer screening. You may have this screening every year starting at age 47 if you have a 30-pack-year history of smoking and currently smoke or have quit within the past 15 years. Fecal occult blood test (FOBT) of the stool. You may have this test every year starting at age 61. Flexible sigmoidoscopy or colonoscopy. You may have a sigmoidoscopy every 5 years or a colonoscopy every 10 years starting at age 32. Hepatitis C blood test. Hepatitis B blood test. Sexually transmitted disease (STD) testing. Diabetes screening. This is done by checking your blood sugar (glucose) after you have not eaten for a while (fasting). You may have this done every 1-3 years. Bone density scan. This is done to screen for osteoporosis. You may have this done starting at age 29. Mammogram. This may be done every 1-2 years. Talk to your health care provider about how often you should have regular mammograms. Talk with your health care provider about your test results, treatment options, and if necessary, the need for more tests. Vaccines  Your health care provider may recommend certain vaccines, such as: Influenza vaccine. This is recommended every year. Tetanus, diphtheria, and acellular pertussis (Tdap, Td) vaccine. You may need a Td booster every 10 years. Zoster vaccine. You may need this after age 47. Pneumococcal 13-valent conjugate (PCV13) vaccine. One dose is recommended after age 15. Pneumococcal polysaccharide (PPSV23) vaccine. One dose is recommended after age 52. Talk to your health care provider about which screenings and vaccines you need and how often you need them. This information is not intended to replace  advice given to you by your health care provider. Make sure you discuss any questions you have with  your health care provider. Document Released: 07/26/2015 Document Revised: 03/18/2016 Document Reviewed: 04/30/2015 Elsevier Interactive Patient Education  2017 [employer].  Fall Prevention in the Home Falls can cause injuries. They can happen to people of all ages. There are many things you can do to make your home safe and to help prevent falls. What can I do on the outside of my home? Regularly fix the edges of walkways and driveways and fix any cracks. Remove anything that might make you trip as you walk through a door, such as a raised step or threshold. Trim any bushes or trees on the path to your home. Use bright outdoor lighting. Clear any walking paths of anything that might make someone trip, such as rocks or tools. Regularly check to see if handrails are loose or broken. Make sure that both sides of any steps have handrails. Any raised decks and porches should have guardrails on the edges. Have any leaves, snow, or ice cleared regularly. Use sand or salt on walking paths during winter. Clean up any spills in your garage right away. This includes oil or grease spills. What can I do in the bathroom? Use night lights. Install grab bars by the toilet and in the tub and shower. Do not use towel bars as grab bars. Use non-skid mats or decals in the tub or shower. If you need to sit down in the shower, use a plastic, non-slip stool. Keep the floor dry. Clean up any water that spills on the floor as soon as it happens. Remove soap buildup in the tub or shower regularly. Attach bath mats securely with double-sided non-slip rug tape. Do not have throw rugs and other things on the floor that can make you trip. What can I do in the bedroom? Use night lights. Make sure that you have a light by your bed that is easy to reach. Do not use any sheets or blankets that are too big for your bed. They should not hang down onto the floor. Have a firm chair that has side arms. You can use this  for support while you get dressed. Do not have throw rugs and other things on the floor that can make you trip. What can I do in the kitchen? Clean up any spills right away. Avoid walking on wet floors. Keep items that you use a lot in easy-to-reach places. If you need to reach something above you, use a strong step stool that has a grab bar. Keep electrical cords out of the way. Do not use floor polish or wax that makes floors slippery. If you must use wax, use non-skid floor wax. Do not have throw rugs and other things on the floor that can make you trip. What can I do with my stairs? Do not leave any items on the stairs. Make sure that there are handrails on both sides of the stairs and use them. Fix handrails that are broken or loose. Make sure that handrails are as long as the stairways. Check any carpeting to make sure that it is firmly attached to the stairs. Fix any carpet that is loose or worn. Avoid having throw rugs at the top or bottom of the stairs. If you do have throw rugs, attach them to the floor with carpet tape. Make sure that you have a light switch at the top of the stairs and the bottom  of the stairs. If you do not have them, ask someone to add them for you. What else can I do to help prevent falls? Wear shoes that: Do not have high heels. Have rubber bottoms. Are comfortable and fit you well. Are closed at the toe. Do not wear sandals. If you use a stepladder: Make sure that it is fully opened. Do not climb a closed stepladder. Make sure that both sides of the stepladder are locked into place. Ask someone to hold it for you, if possible. Clearly mark and make sure that you can see: Any grab bars or handrails. First and last steps. Where the edge of each step is. Use tools that help you move around (mobility aids) if they are needed. These include: Canes. Walkers. Scooters. Crutches. Turn on the lights when you go into a dark area. Replace any light bulbs as  soon as they burn out. Set up your furniture so you have a clear path. Avoid moving your furniture around. If any of your floors are uneven, fix them. If there are any pets around you, be aware of where they are. Review your medicines with your doctor. Some medicines can make you feel dizzy. This can increase your chance of falling. Ask your doctor what other things that you can do to help prevent falls. This information is not intended to replace advice given to you by your health care provider. Make sure you discuss any questions you have with your health care provider. Document Released: 04/25/2009 Document Revised: 12/05/2015 Document Reviewed: 08/03/2014 Elsevier Interactive Patient Education  2017 [employer].

## 2023-04-08 ENCOUNTER — Encounter: Payer: Self-pay | Admitting: Family Medicine

## 2023-04-08 ENCOUNTER — Encounter: Payer: Self-pay | Admitting: Obstetrics and Gynecology

## 2023-04-08 LAB — URINALYSIS, COMPLETE W/RFL CULTURE
Bilirubin Urine: NEGATIVE
Glucose, UA: NEGATIVE
Hyaline Cast: NONE SEEN /LPF
Ketones, ur: NEGATIVE
Nitrites, Initial: POSITIVE — AB
Specific Gravity, Urine: 1.02 (ref 1.001–1.035)
pH: 6 (ref 5.0–8.0)

## 2023-04-08 LAB — URINE CULTURE
MICRO NUMBER:: 15508630
SPECIMEN QUALITY:: ADEQUATE

## 2023-04-08 LAB — CULTURE INDICATED

## 2023-04-13 MED ORDER — RISPERIDONE 0.5 MG PO TABS: 0.5000 mg | ORAL_TABLET | Freq: Every day | ORAL | 3 refills | Status: DC

## 2023-04-19 ENCOUNTER — Ambulatory Visit (INDEPENDENT_AMBULATORY_CARE_PROVIDER_SITE_OTHER): Payer: Medicare Other | Admitting: Podiatry

## 2023-04-19 ENCOUNTER — Encounter: Payer: Self-pay | Admitting: Podiatry

## 2023-04-19 DIAGNOSIS — M79609 Pain in unspecified limb: Secondary | ICD-10-CM

## 2023-04-19 DIAGNOSIS — B351 Tinea unguium: Secondary | ICD-10-CM

## 2023-04-19 NOTE — Progress Notes (Signed)
This patient presents to the office with chief complaint of long thick painful nails.  Patient says the nails are painful walking and wearing shoes.  This patient is unable to self treat.  This patient is unable to trim her nails since she is unable to reach her nails.  She presents to the office for preventative foot care services.  General Appearance  Alert, conversant and in no acute stress.  Vascular  Dorsalis pedis and posterior tibial  pulses are palpable  bilaterally.  Capillary return is within normal limits  bilaterally. Temperature is within normal limits  bilaterally.  Neurologic  Senn-Weinstein monofilament wire test within normal limits  bilaterally. Muscle power within normal limits bilaterally.  Nails Thick disfigured discolored nails with subungual debris  hallux nails  bilaterally. No evidence of bacterial infection or drainage bilaterally.  Orthopedic  No limitations of motion  feet .  No crepitus or effusions noted.  No bony pathology or digital deformities noted.  Skin  normotropic skin with no porokeratosis noted bilaterally.  No signs of infections or ulcers noted.     Onychomycosis  Nails  B/L.  Pain in right toes  Pain in left toes  Debridement of nails both feet followed trimming the nails with dremel tool.    RTC 12   weeks    Helane Gunther DPM

## 2023-04-20 ENCOUNTER — Encounter: Payer: Self-pay | Admitting: Family Medicine

## 2023-04-21 NOTE — Telephone Encounter (Signed)
Medication: Risperidone 0.5 mg Take 1 tablet by mouth at bedtime (If they need quantity, then #90, 1 refill)  To [facility]

## 2023-04-21 NOTE — Telephone Encounter (Signed)
Order faxed.

## 2023-04-21 NOTE — Telephone Encounter (Signed)
Sending message to get information from pharmacy. Please review the additional message from patient family

## 2023-04-22 ENCOUNTER — Ambulatory Visit: Payer: Medicare Other | Admitting: Podiatry

## 2023-04-26 NOTE — Progress Notes (Unsigned)
GYNECOLOGY  VISIT   HPI: 77 y.o.   Divorced  Caucasian  female   G1P1 with No LMP recorded. Patient is postmenopausal.   here for   pessary reinsert.  Her pessary was left out at her visit 04/06/23 due to vaginal ulceration of posterior vaginal cuff.   GYNECOLOGIC HISTORY: No LMP recorded. Patient is postmenopausal. 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   Dementia without behavioral disturbance (HCC) 06/10/2020   History of colonic polyps    History of kidney stones    Hypothyroidism    Liver cyst    Nephrolithiasis    Osteoporosis     Past Surgical History:  Procedure Laterality Date   ABLATION     BACK SURGERY     [phone]   ELECTROPHYSIOLOGIC STUDY N/A 07/22/2015   Procedure: SVT Ablation;  Surgeon: Will Jorja Loa, MD;  Location: [facility];  Service: Cardiovascular;  Laterality: N/A;   PARTIAL COLECTOMY  2005   SMALL INTESTINE SURGERY     THYROIDECTOMY   2008   TOTAL HIP ARTHROPLASTY Right 08/19/2017   Procedure: RIGHT TOTAL HIP ARTHROPLASTY ANTERIOR APPROACH;  Surgeon: Samson Frederic, MD;  Location: [facility];  Service: Orthopedics;  Laterality: Right;    Current Outpatient Medications  Medication Sig Dispense Refill   Calcium Carbonate-Vitamin D (CALTRATE 600+D PO) Take 2 tablets by mouth daily.     celecoxib (CELEBREX) 200 MG capsule TAKE 1 CAPSULE BY MOUTH DAILY. 90 capsule 1   donepezil (ARICEPT) 10 MG tablet TAKE ONE TABLET BY MOUTH AT BEDTIME. 90 tablet 3   estradiol (ESTRACE) 0.1 MG/GM vaginal cream Use 1 gram per vagina with pessary care in the office. 42.5 g 0   fluconazole (DIFLUCAN) 150 MG tablet Take 1 tablet (150 mg total) by mouth once a week. 4 tablet 0   fluticasone (FLONASE) 50 MCG/ACT nasal spray Place 1 spray into both nostrils daily as needed for allergies or rhinitis.     ibuprofen (ADVIL,MOTRIN) 200 MG tablet Take 400 mg by mouth daily as needed for moderate pain.     levothyroxine (SYNTHROID) 100 MCG tablet TAKE 1 TABLET BY MOUTH ONCE DAILY  30 MINUTES BEFORE BREAKFAST 90 tablet 3   memantine (NAMENDA) 10 MG tablet TAKE 1 TABLET BY MOUTH 2 TIMES DAILY. 180 tablet 1   nystatin cream (MYCOSTATIN) Apply 1 Application topically 2 (two) times daily. 60 g 2   risperiDONE (RISPERDAL) 0.5 MG tablet Take 1 tablet (0.5 mg total) by mouth at bedtime. 30 tablet 3   rosuvastatin (CRESTOR) 10 MG tablet TAKE 1 TABLET BY MOUTH DAILY. 90 tablet 3   No current facility-administered medications for this visit.     ALLERGIES: Patient has no known allergies.  Family History  Problem Relation Age of Onset   Stroke Mother    Alzheimer's disease Mother    Heart failure Brother    Alzheimer's disease Sister    Other Father        natural causes   Heart attack Brother    Colon cancer Other        nephew   Ulcerative colitis Sister        colon removed    Social History   Socioeconomic History   Marital status: Divorced     Spouse name: Not on file   Number of children: 1   Years of education: HS   Highest education level: Not on file  Occupational History   Occupation: retired Advice worker: RETIRED  Tobacco Use   Smoking status: Never   Smokeless tobacco: Never  Vaping Use   Vaping status: Never Used  Substance and Sexual Activity   Alcohol use: No    Alcohol/week: 0.0 standard drinks of alcohol   Drug use: No   Sexual activity: Never  Other Topics Concern   Not on file  Social History Narrative   Regular exercise--no   [facility]   Daughter = Dub Amis   Right-handed.   1 cup caffeine daily.               Social Determinants of Health   Financial Resource Strain: Low Risk  (04/06/2023)   Overall Financial Resource Strain (CARDIA)    Difficulty of Paying Living Expenses: Not hard at all  Food Insecurity: No Food Insecurity (04/06/2023)   Hunger Vital Sign    Worried About Running Out of Food in the Last Year: Never true    Ran Out of Food in the Last Year: Never true  Transportation Needs: No Transportation Needs (04/06/2023)   PRAPARE - Administrator, Civil Service (Medical): No    Lack of Transportation (Non-Medical): No  Physical Activity: Insufficiently Active (04/06/2023)   Exercise Vital Sign    Days of Exercise per Week: 3 days    Minutes of Exercise per Session: 30 min  Stress: No Stress Concern Present (04/06/2023)   [employer] of Occupational Health - Occupational Stress Questionnaire    Feeling of Stress : Not at all  Social Connections: Moderately Isolated (04/06/2023)   Social Connection and Isolation Panel [NHANES]    Frequency of Communication with Friends and Family: Never    Frequency of Social Gatherings with Friends and Family: More than three times a week    Attends Religious Services: Never    Database administrator or Organizations: Yes    Attends Engineer, structural: More than 4  times per year    Marital Status: Widowed  Intimate Partner Violence: Not At Risk (04/06/2023)   Humiliation, Afraid, Rape, and Kick questionnaire    Fear of Current or Ex-Partner:  No    Emotionally Abused: No    Physically Abused: No    Sexually Abused: No    Review of Systems  All other systems reviewed and are negative.   PHYSICAL EXAMINATION:    BP 116/74 (BP Location: Left Arm, Patient Position: Sitting, Cuff Size: Normal)   Pulse 84   Wt 202 lb (91.6 kg)   SpO2 96%   BMI 34.67 kg/m     General appearance: alert, cooperative and appears stated age Head: Normocephalic, without obvious abnormality, atraumatic Neck: no adenopathy, supple, symmetrical, trachea midline and thyroid normal to inspection and palpation Lungs: clear to auscultation bilaterally Breasts: normal appearance, no masses or tenderness, No nipple retraction or dimpling, No nipple discharge or bleeding, No axillary or supraclavicular adenopathy Heart: regular rate and rhythm Abdomen: soft, non-tender, no masses,  no organomegaly Extremities: extremities normal, atraumatic, no cyanosis or edema Skin: Skin color, texture, turgor normal. No rashes or lesions Lymph nodes: Cervical, supraclavicular, and axillary nodes normal. No abnormal inguinal nodes palpated Neurologic: Grossly normal  Pelvic: External genitalia:  no lesions              Urethra:  normal appearing urethra with no masses, tenderness or lesions              Bartholins and Skenes: normal                 Vagina: normal appearing vagina with normal color and discharge, no lesions              Cervix: no lesions                Bimanual Exam:  Uterus:  normal size, contour, position, consistency, mobility, non-tender              Adnexa: no mass, fullness, tenderness              Rectal exam: {yes no:314532}.  Confirms.              Anus:  normal sphincter tone, no lesions  Chaperone was present for exam:  ***  ASSESSMENT     PLAN      An After Visit Summary was printed and given to the patient.  ______ minutes face to face time of which over 50% was spent in counseling.

## 2023-04-29 ENCOUNTER — Telehealth: Payer: Self-pay | Admitting: Family Medicine

## 2023-04-29 ENCOUNTER — Ambulatory Visit: Payer: Medicare Other | Admitting: Obstetrics and Gynecology

## 2023-04-29 NOTE — Telephone Encounter (Signed)
Celeste from [facility] called to report that patient has redness in her breast and chest area,and was wondering if she could have a medication or cream prescribed for her?

## 2023-04-29 NOTE — Telephone Encounter (Signed)
Please call to triage

## 2023-04-29 NOTE — Telephone Encounter (Signed)
I spoke with Celeste at [facility] assisted living; celeste is not sure when redness under breast started; was reported from aide while giving shower to pt this morning. Aide also noted that pt was scratching under breast also. Celeste said no blisters or drainage seen and redness is under both breast and between breast also. When asked if med needed to be sent to pharmacy what pharmacy to send to and Celeste said to ck with Stanton Kidney pts daughter. I spoke with Stanton Kidney (DPR signed) and Stanton Kidney said she noticed the redness under both breast where the bra might be a little tight and pt has been perspiring some about 2 wks ago. Stanton Kidney wants cb before calling [facility].Jenkins Rouge also wants order faxed to [facility] .for risperidone; see my chart note on 04/20/23 appears already faxed but if needed fax # is [phone]. I called [facility] x 2 but no answer at nursing station to see if has faxed order. Stanton Kidney still has the risperidone with her. [facility] is correct pharmacy. Also looking at pt message on 04/20/23 Stanton Kidney noted pharmacy where pt got flu shot and RSV vaccine was [facility] at [address]. Sending note to Dr Patsy Lager and [facility].

## 2023-04-30 MED ORDER — FLUCONAZOLE 150 MG PO TABS: 150.0000 mg | ORAL_TABLET | ORAL | 0 refills | Status: DC

## 2023-04-30 MED ORDER — NYSTATIN 100000 UNIT/GM EX CREA: 1.0000 | TOPICAL_CREAM | Freq: Two times a day (BID) | CUTANEOUS | 2 refills | Status: AC

## 2023-04-30 NOTE — Telephone Encounter (Addendum)
Monica Irwin notified as instructed by telephone. I do not see orders for risperdal in chart.  I did let Monica Irwin know orders were faxed on 04/21/23.  She will double check again with Monica Irwin to see if they have the orders. The orders that were faxed already have not been scanned into chart so I am unable to refax orders at this time.  Monica Irwin also states we will need to fax over an order for the Nystatin Cream.  Will fax order on Monday when Monica Irwin is back in the office.

## 2023-04-30 NOTE — Telephone Encounter (Signed)
Monica Irwin requests call  I sent in Diflucan 150 mg, 1 po weekly x 4 weeks  Also Nystatin cream BID  Both sent to [facility]  Please re-fax the Risperdal order to assisted living

## 2023-04-30 NOTE — Addendum Note (Signed)
Addended by: Hannah Beat on: 04/30/2023 09:29 AM   Modules accepted: Orders

## 2023-05-03 NOTE — Telephone Encounter (Signed)
No,  I think you have to write the order out on an Rx pad to fax over to them.

## 2023-05-03 NOTE — Telephone Encounter (Signed)
Can you fax over an order just as the script is written.

## 2023-05-03 NOTE — Telephone Encounter (Signed)
Please fax as requested

## 2023-05-03 NOTE — Telephone Encounter (Signed)
Orders faxed to [facility] at [phone].

## 2023-05-03 NOTE — Telephone Encounter (Signed)
Monica Irwin from [facility] called over and stated that they need an order in order to use the cream and for the risperiDONE (RISPERDAL) 0.5 MG tablet. She stated that it needs to be same direction on the prescription and it can be written on an RX pad. It can be faxed over to [phone]. Thank you!

## 2023-05-10 ENCOUNTER — Ambulatory Visit (INDEPENDENT_AMBULATORY_CARE_PROVIDER_SITE_OTHER): Payer: Medicare Other | Admitting: Obstetrics and Gynecology

## 2023-05-10 ENCOUNTER — Encounter: Payer: Self-pay | Admitting: Obstetrics and Gynecology

## 2023-05-10 VITALS — BP 116/74 | HR 84 | Wt 202.0 lb

## 2023-05-10 DIAGNOSIS — N812 Incomplete uterovaginal prolapse: Secondary | ICD-10-CM

## 2023-05-10 DIAGNOSIS — Z4689 Encounter for fitting and adjustment of other specified devices: Secondary | ICD-10-CM | POA: Diagnosis not present

## 2023-05-18 NOTE — Progress Notes (Unsigned)
    Arielis Leonhart T. Fines Kimberlin, MD, CAQ [facility] [address] [place], [address]  Phone: [phone]  FAX: [phone]  Monica Irwin - 77 y.o. female  MRN [id number]  Date of Birth: 1946-05-11  Date: 05/19/2023  PCP: Hannah Beat, MD  Referral: Hannah Beat, MD  No chief complaint on file.  Subjective:   Monica Irwin is a 77 y.o. very pleasant female patient with There is no height or weight on file to calculate BMI. who presents with the following:  She is a very pleasant lady who I have known for many years.  She has been having advancing dementia over recent years.  She has been in a assisted living for the last few years, as well.  She is on both Aricept and Namenda.  She also has chronic hypothyroidism and is on levothyroxine.  Most recently, she has had some agitation and I started her on Risperdal 0.5 mg at night.  She also has some arthritis pains.  [facility] - on [address]  Moving over there.  Activities start at 9.    Wt Readings from Last 3 Encounters:  05/19/23 206 lb 4 oz (93.6 kg)  05/10/23 202 lb (91.6 kg)  04/06/23 202 lb (91.6 kg)     Review of Systems is noted in the HPI, as appropriate  Objective:   There were no vitals taken for this visit.  GEN: No acute distress; alert,appropriate. PULM: Breathing comfortably in no respiratory distress PSYCH: Normally interactive.   Laboratory and Imaging Data:  Assessment and Plan:   ***

## 2023-05-19 ENCOUNTER — Encounter: Payer: Self-pay | Admitting: Family Medicine

## 2023-05-19 ENCOUNTER — Ambulatory Visit (INDEPENDENT_AMBULATORY_CARE_PROVIDER_SITE_OTHER): Payer: Medicare Other | Admitting: Family Medicine

## 2023-05-19 VITALS — BP 112/74 | HR 69 | Temp 98.2°F | Ht 63.5 in | Wt 206.2 lb

## 2023-05-19 DIAGNOSIS — R7303 Prediabetes: Secondary | ICD-10-CM

## 2023-05-19 DIAGNOSIS — E785 Hyperlipidemia, unspecified: Secondary | ICD-10-CM

## 2023-05-19 DIAGNOSIS — E038 Other specified hypothyroidism: Secondary | ICD-10-CM | POA: Diagnosis not present

## 2023-05-19 DIAGNOSIS — Z79899 Other long term (current) drug therapy: Secondary | ICD-10-CM | POA: Diagnosis not present

## 2023-05-19 DIAGNOSIS — Z111 Encounter for screening for respiratory tuberculosis: Secondary | ICD-10-CM

## 2023-05-19 DIAGNOSIS — F0390 Unspecified dementia without behavioral disturbance: Secondary | ICD-10-CM | POA: Diagnosis not present

## 2023-05-19 LAB — T3, FREE: T3, Free: 2.3 pg/mL (ref 2.3–4.2)

## 2023-05-19 LAB — CBC WITH DIFFERENTIAL/PLATELET
Basophils Absolute: 0 10*3/uL (ref 0.0–0.1)
Basophils Relative: 0.6 % (ref 0.0–3.0)
Eosinophils Absolute: 0.1 10*3/uL (ref 0.0–0.7)
Eosinophils Relative: 0.8 % (ref 0.0–5.0)
HCT: 44.9 % (ref 36.0–46.0)
Hemoglobin: 14.7 g/dL (ref 12.0–15.0)
Lymphocytes Relative: 14.5 % (ref 12.0–46.0)
Lymphs Abs: 1 10*3/uL (ref 0.7–4.0)
MCHC: 32.7 g/dL (ref 30.0–36.0)
MCV: 92.7 fL (ref 78.0–100.0)
Monocytes Absolute: 0.7 10*3/uL (ref 0.1–1.0)
Monocytes Relative: 10.3 % (ref 3.0–12.0)
Neutro Abs: 5 10*3/uL (ref 1.4–7.7)
Neutrophils Relative %: 73.8 % (ref 43.0–77.0)
Platelets: 256 10*3/uL (ref 150.0–400.0)
RBC: 4.84 Mil/uL (ref 3.87–5.11)
RDW: 15.2 % (ref 11.5–15.5)
WBC: 6.8 10*3/uL (ref 4.0–10.5)

## 2023-05-19 LAB — T4, FREE: Free T4: 0.67 ng/dL (ref 0.60–1.60)

## 2023-05-19 LAB — HEPATIC FUNCTION PANEL
ALT: 19 U/L (ref 0–35)
AST: 18 U/L (ref 0–37)
Albumin: 4.1 g/dL (ref 3.5–5.2)
Alkaline Phosphatase: 77 U/L (ref 39–117)
Bilirubin, Direct: 0.1 mg/dL (ref 0.0–0.3)
Total Bilirubin: 0.5 mg/dL (ref 0.2–1.2)
Total Protein: 6.6 g/dL (ref 6.0–8.3)

## 2023-05-19 LAB — TSH: TSH: 30.92 u[IU]/mL — ABNORMAL HIGH (ref 0.35–5.50)

## 2023-05-19 LAB — BASIC METABOLIC PANEL
BUN: 19 mg/dL (ref 6–23)
CO2: 31 meq/L (ref 19–32)
Calcium: 9.2 mg/dL (ref 8.4–10.5)
Chloride: 102 meq/L (ref 96–112)
Creatinine, Ser: 0.98 mg/dL (ref 0.40–1.20)
GFR: 54.92 mL/min — ABNORMAL LOW (ref >60.00)
Glucose, Bld: 82 mg/dL (ref 70–99)
Potassium: 4.2 meq/L (ref 3.5–5.1)
Sodium: 140 meq/L (ref 135–145)

## 2023-05-19 LAB — HEMOGLOBIN A1C: Hgb A1c MFr Bld: 7.2 % — ABNORMAL HIGH (ref 4.6–6.5)

## 2023-05-19 MED ORDER — DONEPEZIL HCL 10 MG PO TABS: ORAL_TABLET | ORAL | 3 refills | Status: DC

## 2023-05-19 MED ORDER — ROSUVASTATIN CALCIUM 10 MG PO TABS: 10.0000 mg | ORAL_TABLET | Freq: Every day | ORAL | 3 refills | Status: DC

## 2023-05-19 MED ORDER — LEVOTHYROXINE SODIUM 100 MCG PO TABS: ORAL_TABLET | ORAL | 3 refills | Status: DC

## 2023-05-19 MED ORDER — MEMANTINE HCL 10 MG PO TABS: 10.0000 mg | ORAL_TABLET | Freq: Two times a day (BID) | ORAL | 3 refills | Status: DC

## 2023-05-20 ENCOUNTER — Ambulatory Visit: Payer: Medicare Other | Admitting: Podiatry

## 2023-05-21 LAB — QUANTIFERON-TB GOLD PLUS
Mitogen-NIL: 7.69 [IU]/mL
NIL: 0.01 [IU]/mL
QuantiFERON-TB Gold Plus: NEGATIVE
TB1-NIL: 0 [IU]/mL
TB2-NIL: 0 [IU]/mL

## 2023-05-21 NOTE — Telephone Encounter (Signed)
FL-2 form placed in Dr. Cyndie Chime office in box to update.

## 2023-05-24 ENCOUNTER — Telehealth: Payer: Self-pay | Admitting: Family Medicine

## 2023-05-24 NOTE — Telephone Encounter (Signed)
Patient's daughter called to follow up on TB test and FL2 form for patient, daughter requested a cal back regarding these and the recent labs from 11/6. Please advise when able.

## 2023-05-24 NOTE — Telephone Encounter (Signed)
[name] notified by telephone that the FL-2, Medication list and Quantiferon TB Gold test results were faxed Attn: Robyn this morning.  Patient will let us know if anything further is needed.

## 2023-07-02 ENCOUNTER — Encounter: Payer: Self-pay | Admitting: Family Medicine

## 2023-07-02 ENCOUNTER — Telehealth: Payer: Self-pay

## 2023-07-02 DIAGNOSIS — Z111 Encounter for screening for respiratory tuberculosis: Secondary | ICD-10-CM

## 2023-07-02 MED ORDER — SULFAMETHOXAZOLE-TRIMETHOPRIM 800-160 MG PO TABS: 1.0000 | ORAL_TABLET | Freq: Two times a day (BID) | ORAL | 0 refills | Status: DC

## 2023-07-02 NOTE — Telephone Encounter (Signed)
I just addressed this about 10-15 minutes ago.  Stanton Kidney may not have seen my message.  I sent in some Septra for her to their requested pharmacy.

## 2023-07-02 NOTE — Telephone Encounter (Signed)
Copied from CRM [id number]. Topic: General - Other >> Jul 02, 2023  9:38 AM Corin V wrote: Reason for CRM: Patient's son in law called to speak with Lupita Leash. Him and wife are with patient and do not have mobile access to MyChart and are requesting responses be sent to Donna's cell at [phone].

## 2023-07-02 NOTE — Telephone Encounter (Signed)
Monica Irwin notified as instructed by telephone. She will need order faxed over to nursing home at [phone].  Will see if Dr. Ermalene Searing will write order on prescription pad to fax over since Dr. Patsy Lager is out of the office.

## 2023-07-02 NOTE — Telephone Encounter (Signed)
Orders to administer Bactrim DS for UTI faxed to [facility] at [phone].

## 2023-07-02 NOTE — Telephone Encounter (Signed)
I spoke with Monica Irwin (DPR signed) she said assisted living facility advised Monica Irwin on 07/01/23 pt was complaining of pain upon urination.[name] did home urinary test that was + for infection. Pt does not have fever. Pt is coughing and has congestion also.  Monica Irwin said she is very busy today and would be a hardship to take pt to appt. No available appts at [facility] today.  Will send note to Dr Patsy Lager and [facility] pool. Monica Irwin said GYN would have to see pt and would not call in med without being seen. UC & ED precautions given and Monica Irwin voiced understanding.[name] request cb ASAP. [facility].

## 2023-07-02 NOTE — Telephone Encounter (Signed)
Can you please call and triage

## 2023-07-02 NOTE — Telephone Encounter (Signed)
Copied from CRM [id number]. Topic: Clinical - Medical Advice >> Jul 01, 2023  5:34 PM Corin V wrote: Reason for CRM: Patient's daughter called in requesting a script for a UTI. Patient's daughter used a home test kit at their facility and test came back positive. She is wanting to know if a script can be sent in to [facility] on [address] in [place].

## 2023-07-19 ENCOUNTER — Ambulatory Visit: Payer: Medicare Other | Admitting: Podiatry

## 2023-07-21 MED ORDER — RISPERIDONE 0.5 MG PO TABS: 0.5000 mg | ORAL_TABLET | Freq: Two times a day (BID) | ORAL | 3 refills | Status: DC

## 2023-07-21 NOTE — Telephone Encounter (Signed)
 Order to increase Risperdal faxed to [facility] at [phone].

## 2023-07-21 NOTE — Addendum Note (Signed)
 Addended by: Hannah Beat on: 07/21/2023 01:08 PM   Modules accepted: Orders

## 2023-07-21 NOTE — Telephone Encounter (Signed)
 Can you send an increase to assisted living to increase Risperdal dosing to 0.5 mg po BID

## 2023-07-27 ENCOUNTER — Ambulatory Visit (INDEPENDENT_AMBULATORY_CARE_PROVIDER_SITE_OTHER): Payer: Medicare Other | Admitting: Podiatry

## 2023-07-27 DIAGNOSIS — M79609 Pain in unspecified limb: Secondary | ICD-10-CM

## 2023-07-27 DIAGNOSIS — F0390 Unspecified dementia without behavioral disturbance: Secondary | ICD-10-CM | POA: Diagnosis not present

## 2023-07-27 DIAGNOSIS — B351 Tinea unguium: Secondary | ICD-10-CM | POA: Diagnosis not present

## 2023-07-27 NOTE — Progress Notes (Signed)
This patient presents to the office with chief complaint of long thick painful nails.  Patient says the nails are painful walking and wearing shoes.  This patient is unable to self treat.  This patient is unable to trim her nails since she is unable to reach her nails.  She presents to the office for preventative foot care services.  General Appearance  Alert, conversant and in no acute stress.  Vascular  Dorsalis pedis and posterior tibial  pulses are palpable  bilaterally.  Capillary return is within normal limits  bilaterally. Temperature is within normal limits  bilaterally.  Neurologic  Senn-Weinstein monofilament wire test within normal limits  bilaterally. Muscle power within normal limits bilaterally.  Nails Thick disfigured discolored nails with subungual debris  hallux nails  bilaterally. No evidence of bacterial infection or drainage bilaterally.  Orthopedic  No limitations of motion  feet .  No crepitus or effusions noted.  No bony pathology or digital deformities noted.  Skin  normotropic skin with no porokeratosis noted bilaterally.  No signs of infections or ulcers noted.     Onychomycosis  Nails  B/L.  Pain in right toes  Pain in left toes  Debridement of nails both feet followed trimming the nails with dremel tool.    RTC 12   weeks    Helane Gunther DPM

## 2023-07-28 ENCOUNTER — Ambulatory Visit: Payer: Medicare Other | Admitting: Podiatry

## 2023-07-28 NOTE — Progress Notes (Unsigned)
GYNECOLOGY  VISIT   HPI: 78 y.o.   Divorced  Caucasian female   G1P1 with No LMP recorded. Patient is postmenopausal.   here for: 3 mo pessary check     Patient's daughter is here for the visit.   Patient is going into memory care and daughter is asking to consider leaving the pessary out.  It is difficult for the patinet to come to the office now for visits.   GYNECOLOGIC HISTORY: No LMP recorded. Patient is postmenopausal. 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   Dementia without behavioral disturbance (HCC) 06/10/2020   History of colonic polyps    History of kidney stones    Hypothyroidism    Liver cyst    Nephrolithiasis    Osteoporosis     Past Surgical History:  Procedure Laterality Date   ABLATION     BACK SURGERY     [phone]   ELECTROPHYSIOLOGIC STUDY N/A 07/22/2015   Procedure: SVT Ablation;  Surgeon: Will Jorja Loa, MD;  Location: [facility];  Service: Cardiovascular;  Laterality: N/A;   PARTIAL COLECTOMY  2005   SMALL INTESTINE SURGERY     THYROIDECTOMY  2008   TOTAL HIP ARTHROPLASTY Right 08/19/2017   Procedure: RIGHT TOTAL HIP ARTHROPLASTY ANTERIOR APPROACH;  Surgeon: Samson Frederic, MD;  Location: [facility];  Service: Orthopedics;  Laterality: Right;    Current Outpatient Medications  Medication Sig Dispense Refill   Calcium Carbonate-Vitamin D (CALTRATE 600+D PO) Take 2 tablets by mouth daily with supper.     donepezil (ARICEPT) 10 MG tablet Take 10 mg by mouth daily with supper.     fluticasone (FLONASE) 50 MCG/ACT nasal spray Place 1 spray into both nostrils daily as needed for allergies or rhinitis.     ibuprofen (ADVIL,MOTRIN) 200 MG tablet Take 400 mg by mouth daily as needed for moderate pain.     levothyroxine (SYNTHROID) 100 MCG tablet TAKE 1 TABLET BY MOUTH ONCE DAILY 30 MINUTES BEFORE BREAKFAST 90 tablet 3   memantine (NAMENDA) 10 MG tablet Take 10 mg by mouth 2 (two)  times daily with a meal. Breakfast and Lunch     nystatin cream (MYCOSTATIN) Apply 1 Application topically 2 (two) times daily. 60 g 2   risperiDONE (RISPERDAL) 0.5 MG tablet Take 1 tablet (0.5 mg total) by mouth 2 (two) times daily. 60 tablet 3   rosuvastatin (CRESTOR) 10 MG tablet Take 10 mg by mouth daily with lunch.     sulfamethoxazole-trimethoprim (BACTRIM DS) 800-160 MG tablet Take 1 tablet by mouth 2 (two) times daily. 14 tablet 0   No current facility-administered medications for this visit.     ALLERGIES: Patient has no known allergies.  Family History  Problem Relation Age of Onset   Stroke Mother    Alzheimer's disease Mother    Heart failure Brother    Alzheimer's disease Sister    Other Father        natural causes   Heart attack Brother    Colon cancer Other        nephew   Ulcerative colitis Sister        colon removed    Social History   Socioeconomic History   Marital  status: Divorced    Spouse name: Not on file   Number of children: 1   Years of education: HS   Highest education level: Not on file  Occupational History   Occupation: retired Advice worker: RETIRED  Tobacco Use   Smoking status: Never   Smokeless tobacco: Never  Vaping Use   Vaping status: Never Used  Substance and Sexual Activity   Alcohol use: No    Alcohol/week: 0.0 standard drinks of alcohol   Drug use: No   Sexual activity: Never  Other Topics Concern   Not on file  Social History Narrative   Regular exercise--no   [facility]   Daughter = Dub Amis   Right-handed.   1 cup caffeine daily.               Social Drivers of Corporate investment banker Strain: Low Risk  (04/06/2023)   Overall Financial Resource Strain (CARDIA)    Difficulty of Paying Living Expenses: Not hard at all  Food Insecurity: No Food Insecurity (04/06/2023)   Hunger Vital Sign    Worried About Running Out of Food in the Last Year: Never true    Ran Out of Food in the Last Year: Never true  Transportation Needs: No Transportation Needs (04/06/2023)   PRAPARE - Administrator, Civil Service (Medical): No    Lack of Transportation (Non-Medical): No  Physical Activity: Insufficiently Active (04/06/2023)   Exercise Vital Sign    Days of Exercise per Week: 3 days    Minutes of Exercise per Session: 30 min  Stress: No Stress Concern Present (04/06/2023)   [employer] of Occupational Health - Occupational Stress Questionnaire    Feeling of Stress : Not at all  Social Connections: Moderately Isolated (04/06/2023)   Social Connection and Isolation Panel [NHANES]    Frequency of Communication with Friends and Family: Never    Frequency of Social Gatherings with Friends and Family: More than three times a week    Attends Religious Services: Never    Database administrator or Organizations: Yes    Attends Hospital doctor: More than 4 times per year    Marital Status: Widowed  Intimate Partner Violence: Not At Risk (04/06/2023)   Humiliation, Afraid, Rape, and Kick questionnaire  Fear of Current or Ex-Partner: No    Emotionally Abused: No    Physically Abused: No    Sexually Abused: No    Review of Systems  See HPI.   PHYSICAL EXAMINATION:   Ht 5\' 4"  (1.626 m)   Wt 206 lb (93.4 kg)   BMI 35.36 kg/m     General appearance: alert, cooperative and appears stated age      Pelvic: External genitalia:  no lesions              Urethra:  normal appearing urethra with no masses, tenderness or lesions              Bartholins and Skenes: normal                 Vagina: normal appearing vagina with normal color and discharge, no lesions              Cervix: no lesions.  Minor erythema of the posterior vaginal mucosa.                  Bimanual Exam:  Uterus:  normal size, contour, position, consistency, mobility, non-tender              Adnexa: no mass, fullness, tenderness              Chaperone was present for exam:  Warren Lacy, CMA  ASSESSMENT:  Incomplete uterovaginal prolapse.  Pessary maintenance.   PLAN:  Leave pessary out Apply small amount of estrogen cream to the urethral weekly for UTI prevention.  Fu prn  {LABS (Optional):23779}  ***  total time was spent for this patient encounter, including preparation, face-to-face counseling with the patient, coordination of care, and documentation of the encounter.

## 2023-08-05 NOTE — Telephone Encounter (Signed)
Order ready, on your desk.

## 2023-08-05 NOTE — Telephone Encounter (Signed)
Order faxed to [facility] to decrease Namenda 10 mg to one tablet qam at [phone].

## 2023-08-11 ENCOUNTER — Ambulatory Visit (INDEPENDENT_AMBULATORY_CARE_PROVIDER_SITE_OTHER): Payer: Medicare Other | Admitting: Obstetrics and Gynecology

## 2023-08-11 VITALS — Ht 64.0 in | Wt 206.0 lb

## 2023-08-11 DIAGNOSIS — N812 Incomplete uterovaginal prolapse: Secondary | ICD-10-CM

## 2023-08-11 DIAGNOSIS — Z4689 Encounter for fitting and adjustment of other specified devices: Secondary | ICD-10-CM

## 2023-08-12 ENCOUNTER — Encounter: Payer: Self-pay | Admitting: Obstetrics and Gynecology

## 2023-08-23 ENCOUNTER — Other Ambulatory Visit: Payer: Self-pay | Admitting: Family Medicine

## 2023-08-23 MED ORDER — RISPERIDONE 0.5 MG PO TABS: 0.5000 mg | ORAL_TABLET | Freq: Every day | ORAL | 3 refills | Status: DC

## 2023-08-23 NOTE — Progress Notes (Signed)
 Decrease dosing to Risperdal  1 tablet at night.

## 2023-08-23 NOTE — Telephone Encounter (Signed)
 Orders to decrease Risperdal  to 1 tablet at bedtime faxed to [facility] at [phone].

## 2023-08-23 NOTE — Telephone Encounter (Signed)
 Orders given - please fax

## 2023-09-14 ENCOUNTER — Other Ambulatory Visit: Payer: Self-pay | Admitting: Family Medicine

## 2023-09-14 ENCOUNTER — Other Ambulatory Visit (INDEPENDENT_AMBULATORY_CARE_PROVIDER_SITE_OTHER)

## 2023-09-14 DIAGNOSIS — R300 Dysuria: Secondary | ICD-10-CM | POA: Diagnosis not present

## 2023-09-14 LAB — URINALYSIS, ROUTINE W REFLEX MICROSCOPIC
Bilirubin Urine: NEGATIVE
Ketones, ur: NEGATIVE
Nitrite: NEGATIVE
Specific Gravity, Urine: 1.01 (ref 1.000–1.030)
Total Protein, Urine: 30 — AB
Urine Glucose: NEGATIVE
Urobilinogen, UA: 0.2 (ref 0.0–1.0)
pH: 7 (ref 5.0–8.0)

## 2023-09-14 MED ORDER — SULFAMETHOXAZOLE-TRIMETHOPRIM 800-160 MG PO TABS: 1.0000 | ORAL_TABLET | Freq: Two times a day (BID) | ORAL | 0 refills | Status: DC

## 2023-09-14 NOTE — Addendum Note (Signed)
 Addended by: Hannah Beat on: 09/14/2023 02:34 PM   Modules accepted: Orders

## 2023-09-15 LAB — URINE CULTURE
MICRO NUMBER:: 16156777
SPECIMEN QUALITY:: ADEQUATE

## 2023-09-16 ENCOUNTER — Encounter: Payer: Self-pay | Admitting: Family Medicine

## 2023-09-16 ENCOUNTER — Telehealth: Payer: Self-pay | Admitting: *Deleted

## 2023-09-16 DIAGNOSIS — R7303 Prediabetes: Secondary | ICD-10-CM

## 2023-09-16 DIAGNOSIS — E785 Hyperlipidemia, unspecified: Secondary | ICD-10-CM

## 2023-09-16 NOTE — Telephone Encounter (Signed)
-----   Message from Lovena Neighbours sent at 09/16/2023  8:37 AM EST ----- Regarding: Labs for Thursday 3.20.25 Please put physical lab orders in future. Thank you, Denny Peon

## 2023-09-16 NOTE — Telephone Encounter (Signed)
 I did not order a CBC since patient had a normal one 4 months ago.  Please add order if you want one drawn.

## 2023-09-22 ENCOUNTER — Other Ambulatory Visit (INDEPENDENT_AMBULATORY_CARE_PROVIDER_SITE_OTHER)

## 2023-09-22 DIAGNOSIS — R7303 Prediabetes: Secondary | ICD-10-CM | POA: Diagnosis not present

## 2023-09-22 DIAGNOSIS — E785 Hyperlipidemia, unspecified: Secondary | ICD-10-CM

## 2023-09-22 LAB — HEPATIC FUNCTION PANEL
ALT: 64 U/L — ABNORMAL HIGH (ref 0–35)
AST: 38 U/L — ABNORMAL HIGH (ref 0–37)
Albumin: 3.9 g/dL (ref 3.5–5.2)
Alkaline Phosphatase: 70 U/L (ref 39–117)
Bilirubin, Direct: 0.1 mg/dL (ref 0.0–0.3)
Total Bilirubin: 0.5 mg/dL (ref 0.2–1.2)
Total Protein: 6.2 g/dL (ref 6.0–8.3)

## 2023-09-22 LAB — BASIC METABOLIC PANEL
BUN: 11 mg/dL (ref 6–23)
CO2: 28 meq/L (ref 19–32)
Calcium: 8.9 mg/dL (ref 8.4–10.5)
Chloride: 101 meq/L (ref 96–112)
Creatinine, Ser: 1.03 mg/dL (ref 0.40–1.20)
GFR: 51.61 mL/min — ABNORMAL LOW (ref >60.00)
Glucose, Bld: 204 mg/dL — ABNORMAL HIGH (ref 70–99)
Potassium: 4.4 meq/L (ref 3.5–5.1)
Sodium: 137 meq/L (ref 135–145)

## 2023-09-22 LAB — LIPID PANEL
Cholesterol: 138 mg/dL (ref 0–200)
HDL: 50 mg/dL (ref >39.00)
LDL Cholesterol: 60 mg/dL (ref 0–99)
NonHDL: 87.85
Total CHOL/HDL Ratio: 3
Triglycerides: 141 mg/dL (ref 0.0–149.0)
VLDL: 28.2 mg/dL (ref 0.0–40.0)

## 2023-09-22 LAB — HEMOGLOBIN A1C: Hgb A1c MFr Bld: 6.9 % — ABNORMAL HIGH (ref 4.6–6.5)

## 2023-09-23 ENCOUNTER — Other Ambulatory Visit: Payer: Self-pay | Admitting: Family Medicine

## 2023-09-30 ENCOUNTER — Ambulatory Visit: Payer: Medicare Other | Admitting: Podiatry

## 2023-09-30 ENCOUNTER — Other Ambulatory Visit

## 2023-10-01 ENCOUNTER — Other Ambulatory Visit (INDEPENDENT_AMBULATORY_CARE_PROVIDER_SITE_OTHER)

## 2023-10-01 ENCOUNTER — Other Ambulatory Visit: Payer: Self-pay | Admitting: Family Medicine

## 2023-10-01 DIAGNOSIS — Z111 Encounter for screening for respiratory tuberculosis: Secondary | ICD-10-CM

## 2023-10-04 ENCOUNTER — Telehealth: Payer: Self-pay

## 2023-10-04 LAB — QUANTIFERON-TB GOLD PLUS

## 2023-10-04 NOTE — Telephone Encounter (Signed)
 Copied from CRM [id number]. Topic: Clinical - Lab/Test Results >> Oct 04, 2023  4:06 PM Deaijah H wrote: Reason for CRM: Patient called in due to TB test stating cancelled. Was returning call to Lupita Leash to continue speaking about cancelled TB test along with paperwork. Please call [phone] (husband number to speak with patient

## 2023-10-05 ENCOUNTER — Other Ambulatory Visit: Payer: Self-pay | Admitting: Family Medicine

## 2023-10-05 ENCOUNTER — Encounter: Payer: Self-pay | Admitting: Family Medicine

## 2023-10-05 ENCOUNTER — Other Ambulatory Visit

## 2023-10-05 DIAGNOSIS — Z111 Encounter for screening for respiratory tuberculosis: Secondary | ICD-10-CM

## 2023-10-05 NOTE — Telephone Encounter (Signed)
 Lab had contacted patient and redrew the Quantiferon Gold this morning.  See MyChart messages as well.

## 2023-10-05 NOTE — Addendum Note (Signed)
 Addended by: Hannah Beat on: 10/05/2023 08:19 AM   Modules accepted: Orders

## 2023-10-06 ENCOUNTER — Telehealth: Payer: Self-pay | Admitting: Family Medicine

## 2023-10-06 ENCOUNTER — Ambulatory Visit: Payer: Medicare Other | Admitting: Podiatry

## 2023-10-06 NOTE — Telephone Encounter (Signed)
 Left message for Barbara Cower to return call.  Need to know if a new FL-2 form needs to be completed or can the previous FL-2 form just be corrected.

## 2023-10-06 NOTE — Telephone Encounter (Signed)
 Corrected FL-2 faxed to Alicia at [phone].

## 2023-10-06 NOTE — Telephone Encounter (Signed)
 Copied from CRM [id number]. Topic: General - Other >> Oct 06, 2023  9:31 AM Martinique E wrote: Reason for CRM: Barbara Cower from [employer] stated that they received an FL2 form for the patient, but he needs the box check that says "Other - Memory Care" instead of "Assisted Living." Callback number for Barbara Cower is [phone] with any questions.

## 2023-10-07 LAB — QUANTIFERON-TB GOLD PLUS
Mitogen-NIL: 2.9 [IU]/mL
NIL: 0.03 [IU]/mL
QuantiFERON-TB Gold Plus: NEGATIVE
TB1-NIL: 0 [IU]/mL
TB2-NIL: 0 [IU]/mL

## 2023-10-08 ENCOUNTER — Encounter: Payer: Self-pay | Admitting: *Deleted

## 2023-10-08 NOTE — Telephone Encounter (Signed)
 Monica Irwin results faxed to [place] at [employer] at [phone].

## 2023-10-12 ENCOUNTER — Encounter: Payer: Self-pay | Admitting: Family Medicine

## 2023-10-12 DIAGNOSIS — E119 Type 2 diabetes mellitus without complications: Secondary | ICD-10-CM | POA: Insufficient documentation

## 2023-10-12 NOTE — Progress Notes (Unsigned)
 Monica Irwin T. Keziyah Kneale, MD, CAQ Sports Medicine [facility] at [facility] [address] [place], [address]  Phone: [phone]  FAX: [phone]  Monica Irwin - 78 y.o. female  MRN [id number]  Date of Birth: April 26, 1946  Date: 10/13/2023  PCP: Hannah Beat, MD  Referral: Hannah Beat, MD  No chief complaint on file.  Patient Care Team: Hannah Beat, MD as PCP - General (Family Medicine) Patton Salles, MD as Consulting Physician (Obstetrics and Gynecology) Subjective:   Monica Irwin is a 78 y.o. pleasant patient who presents for a medicare wellness examination:  Health Maintenance Summary Reviewed and updated, unless pt declines services.  Tobacco History Reviewed. Non-smoker Alcohol: No concerns, no excessive use Exercise Habits: Some activity, rec at least 30 mins 5 times a week STD concerns: none Drug Use: None Birth control method: n/a Menses regular: n/a Lumps or breast concerns: no Breast Cancer Family History: no  She has a very nice lady, who has been my patient for many years.  At this point, really her primary issues are dementia.  This has been advancing steadily.  Generally, her daughter provides all communication is in regular connection with [place].  COVID booster  She is currently on Namenda and Aricept for dementia. She has been having some issues with agitation, and she is now doing Risperdal once at night.  Thyroid: No symptoms. Labs reviewed. Denies cold / heat intolerance, dry skin, hair loss. No goiter. Her last TSH was elevated, however she did have normal free T3, T4  She also is currently on Crestor.  Health Maintenance  Topic Date Due   COVID-19 Vaccine (4 - 2024-25 season) 03/14/2023   INFLUENZA VACCINE  02/11/2024   Medicare Annual Wellness (AWV)  04/05/2024   DTaP/Tdap/Td (3 - Td or Tdap) 01/21/2027   Pneumonia Vaccine 59+ Years old  Completed   DEXA SCAN  Completed    Hepatitis C Screening  Completed   Zoster Vaccines- Shingrix  Completed   HPV VACCINES  Aged Out   Colonoscopy  Discontinued   Immunization History  Administered Date(s) Administered   Fluad Quad(high Dose 65+) 04/06/2019, 03/19/2021, 03/25/2022   Influenza Whole 04/10/2009, 03/13/2010   Influenza, High Dose Seasonal PF 04/01/2018   Influenza,inj,Quad PF,6+ Mos 04/22/2015, 03/30/2016, 05/24/2017   Influenza-Unspecified 04/12/2013, 04/12/2017   PFIZER(Purple Top)SARS-COV-2 Vaccination 08/01/2019, 08/21/2019, 05/26/2020   Pneumococcal Conjugate-13 11/08/2013   Pneumococcal Polysaccharide-23 05/22/2010   Td 07/13/2001   Tdap 01/20/2017   Zoster Recombinant(Shingrix) 02/15/2019, 04/24/2019   Zoster, Live 07/13/2001    Patient Active Problem List   Diagnosis Date Noted   Dementia without behavioral disturbance (HCC) 06/10/2020    Priority: High   Major depressive disorder, recurrent episode, in full remission (HCC) 07/24/2010    Priority: Medium    Hyperlipidemia with target LDL less than 100 12/19/2008    Priority: Medium    Hypothyroidism 07/02/2008    Priority: Medium    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   Dementia without behavioral disturbance (HCC) 06/10/2020   History of kidney stones    Hypothyroidism    Major depressive disorder, recurrent episode, in full remission (HCC) 07/24/2010   Nephrolithiasis  Osteoporosis     Past Surgical History:  Procedure Laterality Date   BACK SURGERY     [phone]   ELECTROPHYSIOLOGIC STUDY N/A 07/22/2015   Procedure: SVT Ablation;  Surgeon: Will Jorja Loa, MD;  Location: [facility];  Service: Cardiovascular;   Laterality: N/A;   PARTIAL COLECTOMY  2005   SMALL INTESTINE SURGERY     THYROIDECTOMY  2008   TOTAL HIP ARTHROPLASTY Right 08/19/2017   Procedure: RIGHT TOTAL HIP ARTHROPLASTY ANTERIOR APPROACH;  Surgeon: Samson Frederic, MD;  Location: [facility];  Service: Orthopedics;  Laterality: Right;    Family History  Problem Relation Age of Onset   Stroke Mother    Alzheimer's disease Mother    Heart failure Brother    Alzheimer's disease Sister    Other Father        natural causes   Heart attack Brother    Colon cancer Other        nephew   Ulcerative colitis Sister        colon removed    Social History   Social History Narrative   Regular exercise--no   Lives [facility].   Daughter = Dub Amis   Right-handed.   1 cup caffeine daily.                Past Medical History, Surgical History, Social History, Family History, Problem List, Medications, and Allergies have been reviewed and updated if relevant.  Review of Systems: Pertinent positives are listed above.  Otherwise, a full 14 point review of systems has been done in full and it is negative except where it is noted positive.  Objective:   There were no vitals taken for this visit.    03/07/2020    9:30 AM 03/19/2021   10:05 AM 03/25/2022   11:10 AM 08/12/2022   10:13 AM 04/06/2023    3:18 PM  Fall Risk  Falls in the past year? 0 0 0 0 0  Was there an injury with Fall?    0 0  Fall Risk Category Calculator    0 0  Patient at Risk for Falls Due to    No Fall Risks;Impaired mobility Impaired balance/gait  Fall risk Follow up    Falls evaluation completed Falls evaluation completed;Education provided;Falls prevention discussed   Ideal Body Weight:   No results found.    04/06/2023    3:11 PM 03/25/2022   11:11 AM 03/19/2021   10:05 AM 03/07/2020    9:30 AM 02/13/2019   10:20 AM  Depression screen PHQ 2/9  Decreased Interest 0 0 0 0 0  Down, Depressed, Hopeless 0 1 0 0 0  PHQ - 2 Score 0 1 0 0 0  Altered  sleeping 0      Tired, decreased energy 0      Change in appetite 0      Feeling bad or failure about yourself  0      Trouble concentrating 2      Moving slowly or fidgety/restless 0      Suicidal thoughts 0      PHQ-9 Score 2      Difficult doing work/chores Not difficult at all         GEN: well developed, well nourished, no acute distress Eyes: conjunctiva and lids normal, PERRLA, EOMI ENT: TM clear, nares clear, oral exam WNL Neck: supple, no lymphadenopathy, no thyromegaly, no JVD Pulm: clear to auscultation and percussion, respiratory effort normal CV:  regular rate and rhythm, S1-S2, no murmur, rub or gallop, no bruits Chest: no scars, masses, no lumps BREAST: breast exam declined GI: soft, non-tender; no hepatosplenomegaly, masses; active bowel sounds all quadrants GU: GU exam declined Lymph: no cervical, axillary or inguinal adenopathy MSK: gait normal, muscle tone and strength WNL, no joint swelling, effusions, discoloration, crepitus  SKIN: clear, good turgor, color WNL, no rashes, lesions, or ulcerations Neuro: normal mental status, normal strength, sensation, and motion Psych: alert; oriented to person, place and time, normally interactive and not anxious or depressed in appearance.   All labs reviewed with patient.  Lipids: Lab Results  Component Value Date   CHOL 138 09/22/2023   Lab Results  Component Value Date   HDL 50.00 09/22/2023   Lab Results  Component Value Date   LDLCALC 60 09/22/2023   Lab Results  Component Value Date   TRIG 141.0 09/22/2023   Lab Results  Component Value Date   CHOLHDL 3 09/22/2023   CBC:    Latest Ref Rng & Units 05/19/2023   11:11 AM 03/19/2022    8:50 AM 03/12/2021    8:25 AM  CBC  WBC 4.0 - 10.5 K/uL 6.8  5.0  5.0   Hemoglobin 12.0 - 15.0 g/dL 78.4  69.6  29.5   Hematocrit 36.0 - 46.0 % 44.9  45.4  45.6   Platelets 150.0 - 400.0 K/uL 256.0  210.0  238.0     Basic Metabolic Panel:    Component Value  Date/Time   NA 137 09/22/2023 0945   NA 144 10/19/2016 1308   K 4.4 09/22/2023 0945   CL 101 09/22/2023 0945   CO2 28 09/22/2023 0945   BUN 11 09/22/2023 0945   BUN 22 10/19/2016 1308   CREATININE 1.03 09/22/2023 0945   CREATININE 0.79 07/16/2015 1228   GLUCOSE 204 (H) 09/22/2023 0945   CALCIUM 8.9 09/22/2023 0945      Latest Ref Rng & Units 09/22/2023    9:45 AM 05/19/2023   11:11 AM 07/22/2022    2:43 PM  Hepatic Function  Total Protein 6.0 - 8.3 g/dL 6.2  6.6  6.0   Albumin 3.5 - 5.2 g/dL 3.9  4.1  3.8   AST 0 - 37 U/L 38  18  22   ALT 0 - 35 U/L 64  19  27   Alk Phosphatase 39 - 117 U/L 70  77  72   Total Bilirubin 0.2 - 1.2 mg/dL 0.5  0.5  0.4   Bilirubin, Direct 0.0 - 0.3 mg/dL 0.1  0.1  0.1     Lab Results  Component Value Date   HGBA1C 6.9 (H) 09/22/2023   Lab Results  Component Value Date   TSH 30.92 (H) 05/19/2023    No results found.  Assessment and Plan:     ICD-10-CM   1. Healthcare maintenance  Z00.00       Health Maintenance Exam: The patient's preventative maintenance and recommended screening tests for an annual wellness exam were reviewed in full today. Brought up to date unless services declined.  Counselled on the importance of diet, exercise, and its role in overall health and mortality. The patient's FH and SH was reviewed, including their home life, tobacco status, and drug and alcohol status.  Follow-up in 1 year for physical exam or additional follow-up below.  I have personally reviewed the Medicare Annual Wellness questionnaire and have noted 1. The patient's medical and social history 2. Their use of alcohol,  tobacco or illicit drugs 3. Their current medications and supplements 4. The patient's functional ability including ADL's, fall risks, home safety risks and hearing or visual             impairment. 5. Diet and physical activities 6. Evidence for depression or mood disorders 7. Reviewed Updated provider list, see scanned  forms and [facility] Snapshot.  8. Reviewed whether or not the patient has HCPOA or living will, and discussed what this means with the patient.  Recommended she bring in a copy for his chart in [facility].  The patients weight, height, BMI and visual acuity have been recorded in the chart I have made referrals, counseling and provided education to the patient based review of the above and I have provided the pt with a written personalized care plan for preventive services.  I have provided the patient with a copy of your personalized plan for preventive services. Instructed to take the time to review along with their updated medication list.  Disposition: No follow-ups on file.  Future Appointments  Date Time Provider Department Center  10/13/2023 10:20 AM Travone Georg, Karleen Hampshire, MD [facility]    No orders of the defined types were placed in this encounter.  Medications Discontinued During This Encounter  Medication Reason   sulfamethoxazole-trimethoprim (BACTRIM DS) 800-160 MG tablet    No orders of the defined types were placed in this encounter.   Signed,  Elpidio Galea. Kameron Glazebrook, MD   Allergies as of 10/13/2023   No Known Allergies      Medication List        Accurate as of October 12, 2023  1:44 PM. If you have any questions, ask your nurse or doctor.          STOP taking these medications    sulfamethoxazole-trimethoprim 800-160 MG tablet Commonly known as: BACTRIM DS Stopped by: Karleen Hampshire Ciin Brazzel       TAKE these medications    CALTRATE 600+D PO Take 2 tablets by mouth daily with supper.   donepezil 10 MG tablet Commonly known as: ARICEPT Take 10 mg by mouth daily with supper.   fluticasone 50 MCG/ACT nasal spray Commonly known as: FLONASE Place 1 spray into both nostrils daily as needed for allergies or rhinitis.   ibuprofen 200 MG tablet Commonly known as: ADVIL Take 400 mg by mouth daily as needed for moderate pain.   levothyroxine 100 MCG tablet Commonly known as:  SYNTHROID TAKE 1 TABLET BY MOUTH ONCE DAILY 30 MINUTES BEFORE BREAKFAST   memantine 10 MG tablet Commonly known as: NAMENDA Take 10 mg by mouth 2 (two) times daily with a meal. Breakfast and Lunch   nystatin cream Commonly known as: MYCOSTATIN Apply 1 Application topically 2 (two) times daily.   risperiDONE 0.5 MG tablet Commonly known as: RISPERDAL TAKE 1 TABLET (0.5 MG TOTAL) BY MOUTH AT BEDTIME.   rosuvastatin 10 MG tablet Commonly known as: CRESTOR Take 10 mg by mouth daily with lunch.

## 2023-10-13 ENCOUNTER — Encounter: Payer: Self-pay | Admitting: Family Medicine

## 2023-10-13 ENCOUNTER — Ambulatory Visit (INDEPENDENT_AMBULATORY_CARE_PROVIDER_SITE_OTHER): Admitting: Family Medicine

## 2023-10-13 VITALS — BP 110/78 | HR 74 | Temp 97.4°F | Ht 63.5 in | Wt 194.1 lb

## 2023-10-13 DIAGNOSIS — E119 Type 2 diabetes mellitus without complications: Secondary | ICD-10-CM | POA: Diagnosis not present

## 2023-10-13 DIAGNOSIS — Z0000 Encounter for general adult medical examination without abnormal findings: Secondary | ICD-10-CM | POA: Diagnosis not present

## 2023-10-13 DIAGNOSIS — R7989 Other specified abnormal findings of blood chemistry: Secondary | ICD-10-CM

## 2023-10-13 DIAGNOSIS — R8290 Unspecified abnormal findings in urine: Secondary | ICD-10-CM | POA: Diagnosis not present

## 2023-10-13 DIAGNOSIS — F0390 Unspecified dementia without behavioral disturbance: Secondary | ICD-10-CM | POA: Diagnosis not present

## 2023-10-13 DIAGNOSIS — Z9181 History of falling: Secondary | ICD-10-CM

## 2023-10-13 LAB — MICROALBUMIN / CREATININE URINE RATIO
Creatinine,U: 105 mg/dL
Microalb Creat Ratio: 57.5 mg/g — ABNORMAL HIGH (ref 0.0–30.0)
Microalb, Ur: 6 mg/dL — ABNORMAL HIGH (ref 0.0–1.9)

## 2023-10-13 LAB — POC URINALSYSI DIPSTICK (AUTOMATED)
Bilirubin, UA: NEGATIVE
Glucose, UA: POSITIVE — AB
Ketones, UA: NEGATIVE
Nitrite, UA: POSITIVE
Protein, UA: POSITIVE — AB
Spec Grav, UA: 1.015 (ref 1.010–1.025)
Urobilinogen, UA: 0.2 U/dL
pH, UA: 6 (ref 5.0–8.0)

## 2023-10-13 MED ORDER — SULFAMETHOXAZOLE-TRIMETHOPRIM 800-160 MG PO TABS: 1.0000 | ORAL_TABLET | Freq: Two times a day (BID) | ORAL | 0 refills | Status: DC

## 2023-10-13 NOTE — Patient Instructions (Signed)
 I received a note from [facility], and they requested a home health consult for PT to assess the patient and assist with walker training.  I will plan on making a home health consult asking this.

## 2023-10-14 LAB — URINE CULTURE
MICRO NUMBER:: 16279656
SPECIMEN QUALITY:: ADEQUATE

## 2023-10-25 ENCOUNTER — Telehealth: Payer: Self-pay

## 2023-10-25 NOTE — Telephone Encounter (Signed)
 Copied from CRM [id number]. Topic: Clinical - Home Health Verbal Orders >> Oct 25, 2023  1:07 PM Carlatta H wrote: Caller/Agency: [facility] Number: [phone] Service Requested: Physical Therapy Frequency: 1 time a week for 5 weeks.1 time every 2 weeks for 4 weeks Any new concerns about the patient? No

## 2023-10-26 NOTE — Telephone Encounter (Signed)
 Called left detailed message on secured voicemail with verbal orders. Advised to call back to office if any questions.

## 2023-11-11 ENCOUNTER — Encounter: Payer: Self-pay | Admitting: Family Medicine

## 2023-11-11 NOTE — Telephone Encounter (Signed)
 Script written

## 2023-12-02 ENCOUNTER — Encounter: Payer: Self-pay | Admitting: Family Medicine

## 2023-12-02 ENCOUNTER — Other Ambulatory Visit (INDEPENDENT_AMBULATORY_CARE_PROVIDER_SITE_OTHER)

## 2023-12-02 DIAGNOSIS — R300 Dysuria: Secondary | ICD-10-CM

## 2023-12-02 NOTE — Telephone Encounter (Signed)
 Urine Sample dropped off.  Sent for Urinalysis with reflex to microscopic and urine culture.  FYI to Dr. Geralyn Knee.

## 2023-12-03 ENCOUNTER — Ambulatory Visit: Payer: Self-pay | Admitting: Family Medicine

## 2023-12-03 LAB — URINALYSIS, ROUTINE W REFLEX MICROSCOPIC
Bilirubin Urine: NEGATIVE
Ketones, ur: NEGATIVE
Nitrite: POSITIVE — AB
Specific Gravity, Urine: 1.015 (ref 1.000–1.030)
Total Protein, Urine: NEGATIVE
Urine Glucose: NEGATIVE
Urobilinogen, UA: 0.2 (ref 0.0–1.0)
pH: 6.5 (ref 5.0–8.0)

## 2023-12-03 MED ORDER — SULFAMETHOXAZOLE-TRIMETHOPRIM 800-160 MG PO TABS: 1.0000 | ORAL_TABLET | Freq: Two times a day (BID) | ORAL | 0 refills | Status: DC

## 2023-12-03 NOTE — Addendum Note (Signed)
 Addended by: Scherrie Curt on: 12/03/2023 11:25 AM   Modules accepted: Orders

## 2023-12-03 NOTE — Telephone Encounter (Signed)
 I had Dr. Cherlyn Cornet write orders to administer Bactrim  DS and faxed to [facility] at [phone].

## 2023-12-04 LAB — URINE CULTURE
MICRO NUMBER:: 16489766
SPECIMEN QUALITY:: ADEQUATE

## 2023-12-27 ENCOUNTER — Telehealth: Payer: Self-pay | Admitting: Family Medicine

## 2023-12-27 NOTE — Telephone Encounter (Signed)
 Verbal orders have been given to [facility] per Dr. Geralyn Knee.

## 2023-12-27 NOTE — Telephone Encounter (Signed)
 Okay for verbal orders.

## 2023-12-27 NOTE — Telephone Encounter (Signed)
 Copied from CRM [id number]. Topic: Clinical - Home Health Verbal Orders >> Dec 27, 2023  9:16 AM Cruzita Dopp I wrote: Caller/Agency: [facility]  Callback Number: [phone] Service Requested: Physical Therapy Frequency: 1 week 2 and then once every other week for 6 weeks  Any new concerns about the patient? No

## 2023-12-29 ENCOUNTER — Ambulatory Visit: Payer: Self-pay

## 2023-12-29 ENCOUNTER — Other Ambulatory Visit

## 2023-12-29 ENCOUNTER — Encounter: Payer: Self-pay | Admitting: Family Medicine

## 2023-12-29 ENCOUNTER — Telehealth: Payer: Self-pay

## 2023-12-29 ENCOUNTER — Other Ambulatory Visit (INDEPENDENT_AMBULATORY_CARE_PROVIDER_SITE_OTHER)

## 2023-12-29 DIAGNOSIS — R300 Dysuria: Secondary | ICD-10-CM

## 2023-12-29 LAB — POC URINALSYSI DIPSTICK (AUTOMATED)
Bilirubin, UA: NEGATIVE
Glucose, UA: NEGATIVE
Ketones, UA: NEGATIVE
Nitrite, UA: POSITIVE
Protein, UA: NEGATIVE
Spec Grav, UA: 1.01 (ref 1.010–1.025)
Urobilinogen, UA: 0.2 U/dL
pH, UA: 6.5 (ref 5.0–8.0)

## 2023-12-29 MED ORDER — CEPHALEXIN 500 MG PO CAPS: 500.0000 mg | ORAL_CAPSULE | Freq: Two times a day (BID) | ORAL | 0 refills | Status: DC

## 2023-12-29 NOTE — Addendum Note (Signed)
 Addended by: Claire Crick on: 12/29/2023 04:43 PM   Modules accepted: Orders

## 2023-12-29 NOTE — Progress Notes (Addendum)
 ERx keflex for abnormal UA.  UCx pending.

## 2023-12-29 NOTE — Addendum Note (Signed)
 Addended by: Bernadene Brewer on: 12/29/2023 04:40 PM   Modules accepted: Orders

## 2023-12-29 NOTE — Telephone Encounter (Signed)
 Plz notify pt/family -UA abnormal suspicious for infection.  Start keflex antibiotic sent to [facility].  UCx sent.  No abx allergies.   Previous UCx 12/02/2023 - >100k pansensitive Klebsiella.  Previous UCx 10/2023 - [facility].  Previous UCx 09/2023 - [facility].   Lab Results  Component Value Date   NA 137 09/22/2023   CL 101 09/22/2023   K 4.4 09/22/2023   CO2 28 09/22/2023   BUN 11 09/22/2023   CREATININE 1.03 09/22/2023   GFR 51.61 (L) 09/22/2023   CALCIUM  8.9 09/22/2023   ALBUMIN 3.9 09/22/2023   GLUCOSE 204 (H) 09/22/2023

## 2023-12-29 NOTE — Telephone Encounter (Signed)
 Copied from CRM [id number]. Topic: General - Other >> Dec 29, 2023 10:43 AM Freya Jesus wrote: Reason for CRM: Patient daughter Abran Abrahams called with a message for Abe Abed. Stated her mother may have a UTI and her facility needs an order to get a sample. Please fax to the attention of Leary Provencal.

## 2023-12-29 NOTE — Telephone Encounter (Signed)
Ok to do this/thanks 

## 2023-12-29 NOTE — Telephone Encounter (Signed)
 When in process of getting order faxed lab informed patient family has dropped off sample to our office. I have processed and resulted urine dip. Sent off for culture.

## 2023-12-29 NOTE — Telephone Encounter (Signed)
 Left message to return call to our office.  When call back receive please have talk with triage nurse.

## 2023-12-29 NOTE — Addendum Note (Signed)
 Addended by: Claire Crick on: 12/29/2023 04:46 PM   Modules accepted: Orders

## 2023-12-29 NOTE — Telephone Encounter (Signed)
 Called and let daughter know. Per her request have faxed order to [facility] [place] for their records.

## 2023-12-29 NOTE — Telephone Encounter (Signed)
 Duplicate message. One with patient family returned call and gave more information sent to pcp and provider in office.

## 2023-12-29 NOTE — Telephone Encounter (Signed)
 FYI Only or Action Required?: Action required by provider  Patient was last seen in primary care on 10/13/2023 by Scherrie Curt, MD. Called Nurse Triage reporting Urinary Tract Infection. Symptoms began yesterday. Interventions attempted: Nothing. Symptoms are: gradually worsening.  Triage Disposition: See Physician Within 24 Hours  Patient/caregiver understands and will follow disposition?: No, wishes to speak with PCP  Copied from CRM [id number]. Topic: Clinical - Red Word Triage >> Dec 29, 2023 12:38 PM Jenice Mitts wrote: Red Word that prompted transfer to Nurse Triage: UTI  Patient is having trouble urinating and has a possible uti CMA Joellen advised them to talk to a triage nurse per reply to CRM Reason for Disposition  Bad or foul-smelling urine  Answer Assessment - Initial Assessment Questions 1. SYMPTOM: What's the main symptom you're concerned about? (e.g., frequency, incontinence)     odorous 2. ONSET: When did the  odor  start?     Unsure per caller, daughter 3. PAIN: Is there any pain? If Yes, ask: How bad is it? (Scale: 1-10; mild, moderate, severe)     unsure 4. CAUSE: What do you think is causing the symptoms?     UTI 5. OTHER SYMPTOMS: Do you have any other symptoms? (e.g., blood in urine, fever, flank pain, pain with urination)     Denies, pt has alzheimer  Please send UA order via fax to [facility], C/[facility] [place]. Call daughter Abran Abrahams with questions.  Protocols used: Urinary Symptoms-A-AH

## 2023-12-30 ENCOUNTER — Ambulatory Visit: Payer: Self-pay | Admitting: Family Medicine

## 2023-12-30 LAB — URINE CULTURE
MICRO NUMBER:: 16596125
SPECIMEN QUALITY:: ADEQUATE

## 2024-01-03 DIAGNOSIS — E039 Hypothyroidism, unspecified: Secondary | ICD-10-CM | POA: Diagnosis not present

## 2024-01-03 DIAGNOSIS — Z96641 Presence of right artificial hip joint: Secondary | ICD-10-CM

## 2024-01-03 DIAGNOSIS — F0283 Dementia in other diseases classified elsewhere, unspecified severity, with mood disturbance: Secondary | ICD-10-CM | POA: Diagnosis not present

## 2024-01-03 DIAGNOSIS — G4700 Insomnia, unspecified: Secondary | ICD-10-CM

## 2024-01-03 DIAGNOSIS — Z87442 Personal history of urinary calculi: Secondary | ICD-10-CM

## 2024-01-03 DIAGNOSIS — F02818 Dementia in other diseases classified elsewhere, unspecified severity, with other behavioral disturbance: Secondary | ICD-10-CM

## 2024-01-03 DIAGNOSIS — M810 Age-related osteoporosis without current pathological fracture: Secondary | ICD-10-CM

## 2024-01-03 DIAGNOSIS — E119 Type 2 diabetes mellitus without complications: Secondary | ICD-10-CM | POA: Diagnosis not present

## 2024-01-03 DIAGNOSIS — Z556 Problems related to health literacy: Secondary | ICD-10-CM

## 2024-01-03 DIAGNOSIS — E785 Hyperlipidemia, unspecified: Secondary | ICD-10-CM

## 2024-01-03 DIAGNOSIS — F3342 Major depressive disorder, recurrent, in full remission: Secondary | ICD-10-CM | POA: Diagnosis not present

## 2024-01-03 DIAGNOSIS — M1990 Unspecified osteoarthritis, unspecified site: Secondary | ICD-10-CM

## 2024-01-31 ENCOUNTER — Other Ambulatory Visit: Payer: Self-pay | Admitting: Family Medicine

## 2024-01-31 NOTE — Telephone Encounter (Unsigned)
 Copied from CRM [id number]. Topic: Clinical - Medication Refill >> Jan 31, 2024  9:55 AM Jasmin G wrote: Medication: risperiDONE  (RISPERDAL ) 0.5 MG tablet  Has the patient contacted their pharmacy? Yes (Agent: If no, request that the patient contact the pharmacy for the refill. If patient does not wish to contact the pharmacy document the reason why and proceed with request.) (Agent: If yes, when and what did the pharmacy advise?)  This is the patient's preferred pharmacy:  [facility] - [place], [place] - [address] [place] [address] Phone: [phone] Fax: [phone]  [id number] - [address] [place] - [address] [place] [address] Phone: [phone] Fax: [phone]  [id number] [place], [place] - [address] AT [facility] [address] [facility] [address] [place] [address] Phone: [phone] Fax: [phone]  [facility] [address] [place] - [address] [place] [address] Phone: [phone] Fax: [phone]  Is this the correct pharmacy for this prescription? Yes If no, delete pharmacy and type the correct one.   Has the prescription been filled recently? No  Is the patient out of the medication? Yes  Has the patient been seen for an appointment in the last year OR does the patient have an upcoming appointment? Yes  Can we respond through MyChart? No  Agent: Please be advised that Rx refills may take up to 3 business days. We ask that you follow-up with your pharmacy.

## 2024-02-22 ENCOUNTER — Encounter: Payer: Self-pay | Admitting: Family Medicine

## 2024-02-24 ENCOUNTER — Other Ambulatory Visit (INDEPENDENT_AMBULATORY_CARE_PROVIDER_SITE_OTHER)

## 2024-02-24 DIAGNOSIS — R300 Dysuria: Secondary | ICD-10-CM

## 2024-02-24 LAB — URINALYSIS, ROUTINE W REFLEX MICROSCOPIC
Bilirubin Urine: NEGATIVE
Ketones, ur: NEGATIVE
Nitrite: NEGATIVE
RBC / HPF: NONE SEEN (ref 0–?)
Specific Gravity, Urine: 1.015 (ref 1.000–1.030)
Urine Glucose: NEGATIVE
Urobilinogen, UA: 0.2 (ref 0.0–1.0)
pH: 6 (ref 5.0–8.0)

## 2024-02-25 LAB — URINE CULTURE
MICRO NUMBER:: 16832123
SPECIMEN QUALITY:: ADEQUATE

## 2024-02-25 MED ORDER — CIPROFLOXACIN HCL 250 MG PO TABS: 250.0000 mg | ORAL_TABLET | Freq: Two times a day (BID) | ORAL | 0 refills | Status: DC

## 2024-02-25 NOTE — Telephone Encounter (Signed)
 Printed copy of Cipro prescription and faxed to [phone] with instructions to administer to Ms. Finklea for treatment of UTI.

## 2024-02-25 NOTE — Addendum Note (Signed)
 Addended by: WENDELL ARLAND RAMAN on: 02/25/2024 04:02 PM   Modules accepted: Orders

## 2024-03-14 ENCOUNTER — Ambulatory Visit

## 2024-03-28 ENCOUNTER — Ambulatory Visit: Payer: Self-pay

## 2024-03-28 NOTE — Telephone Encounter (Signed)
 First, I really want her to do a Covid-19 test and a flu test ASAP.  Covid would be the primary concern, since there is so much right now.

## 2024-03-28 NOTE — Telephone Encounter (Signed)
 FYI Only or Action Required?: Action required by provider: update on patient condition.  Patient was last seen in primary care on 10/13/2023 by Watt Mirza, MD.  Called Nurse Triage reporting Cough.  Symptoms began several days ago.  Interventions attempted: Prescription medications: inhaler/nebulizer.  Symptoms are: gradually worsening.  Triage Disposition: See HCP Within 4 Hours (Or PCP Triage)  Patient/caregiver understands and will follow disposition?: YesCopied from CRM [id number]. Topic: Clinical - Red Word Triage >> Mar 28, 2024 10:04 AM Drema MATSU wrote: Red Word that prompted transfer to Nurse Triage: Patient daughter went to check on patient this morning and she has bad upper respiratory issues going on. She is wheezing, has a bad cough and diarrhea. Gill doesn't want this to turn into pneumonia. Reason for Disposition  Wheezing is present  Answer Assessment - Initial Assessment Questions Daughter Adrien came to assisted living to give inhaler and nebulizer. She's wheezing- doesn't seem to be SOB. Mama is hard to get with her Alzheimers. No office or surrounding office appts today that worked for them. Adrien is asking if medication can be called in.  Please call Adrien at [phone]  Adrien stated if PCP can do video visit for him to hear wheezing she will drive back out to assisted living. Adrien stated if PCP can't help she will call 911. She can't sit in [facility] waiting room. Her Alzheimer is too bad.    1. ONSET: When did the cough begin?      Over weekend 2. SEVERITY: How bad is the cough today?      na 3. SPUTUM: Describe the color of your sputum (e.g., none, dry cough; clear, white, yellow, green)     na 4. HEMOPTYSIS: Are you coughing up any blood? If Yes, ask: How much? (e.g., flecks, streaks, tablespoons, etc.)     na 5. DIFFICULTY BREATHING: Are you having difficulty breathing? If Yes, ask: How bad is it? (e.g., mild, moderate, severe)       wheezing 6. FEVER: Do you have a fever? If Yes, ask: What is your temperature, how was it measured, and when did it start?     no 7. CARDIAC HISTORY: Do you have any history of heart disease? (e.g., heart attack, congestive heart failure)      na 8. LUNG HISTORY: Do you have any history of lung disease?  (e.g., pulmonary embolus, asthma, emphysema)     denies 9. PE RISK FACTORS: Do you have a history of blood clots? (or: recent major surgery, recent prolonged travel, bedridden)     na 10. OTHER SYMPTOMS: Do you have any other symptoms? (e.g., runny nose, wheezing, chest pain)       Diarrhea, wheezing  Protocols used: Cough - Acute Non-Productive-A-AH

## 2024-03-29 ENCOUNTER — Other Ambulatory Visit: Payer: Self-pay | Admitting: Family Medicine

## 2024-03-29 MED ORDER — DOXYCYCLINE HYCLATE 100 MG PO TABS: 100.0000 mg | ORAL_TABLET | Freq: Two times a day (BID) | ORAL | 0 refills | Status: DC

## 2024-03-30 ENCOUNTER — Encounter: Payer: Self-pay | Admitting: Family Medicine

## 2024-03-30 ENCOUNTER — Ambulatory Visit: Payer: Self-pay | Admitting: Family Medicine

## 2024-03-30 ENCOUNTER — Ambulatory Visit (INDEPENDENT_AMBULATORY_CARE_PROVIDER_SITE_OTHER): Admitting: Family Medicine

## 2024-03-30 ENCOUNTER — Ambulatory Visit (INDEPENDENT_AMBULATORY_CARE_PROVIDER_SITE_OTHER)
Admission: RE | Admit: 2024-03-30 | Discharge: 2024-03-30 | Disposition: A | Discharge status: Home / Self Care | Source: Ambulatory Visit | Attending: Family Medicine | Admitting: Family Medicine

## 2024-03-30 VITALS — BP 122/76 | HR 64 | Temp 98.2°F | Ht 63.5 in | Wt 202.2 lb

## 2024-03-30 DIAGNOSIS — R051 Acute cough: Secondary | ICD-10-CM | POA: Diagnosis not present

## 2024-03-30 DIAGNOSIS — J4521 Mild intermittent asthma with (acute) exacerbation: Secondary | ICD-10-CM | POA: Diagnosis not present

## 2024-03-30 DIAGNOSIS — R0602 Shortness of breath: Secondary | ICD-10-CM | POA: Diagnosis not present

## 2024-03-30 LAB — POC COVID19 BINAXNOW: SARS Coronavirus 2 Ag: NEGATIVE

## 2024-03-30 LAB — CBC WITH DIFFERENTIAL/PLATELET
Basophils Absolute: 0 K/uL (ref 0.0–0.1)
Basophils Relative: 0.4 % (ref 0.0–3.0)
Eosinophils Absolute: 0.1 K/uL (ref 0.0–0.7)
Eosinophils Relative: 1.4 % (ref 0.0–5.0)
HCT: 43.5 % (ref 36.0–46.0)
Hemoglobin: 14 g/dL (ref 12.0–15.0)
Lymphocytes Relative: 22.8 % (ref 12.0–46.0)
Lymphs Abs: 1.4 K/uL (ref 0.7–4.0)
MCHC: 32.3 g/dL (ref 30.0–36.0)
MCV: 87.4 fl (ref 78.0–100.0)
Monocytes Absolute: 0.7 K/uL (ref 0.1–1.0)
Monocytes Relative: 10.3 % (ref 3.0–12.0)
Neutro Abs: 4.1 K/uL (ref 1.4–7.7)
Neutrophils Relative %: 65.1 % (ref 43.0–77.0)
Platelets: 267 K/uL (ref 150.0–400.0)
RBC: 4.97 Mil/uL (ref 3.87–5.11)
RDW: 16.9 % — ABNORMAL HIGH (ref 11.5–15.5)
WBC: 6.3 K/uL (ref 4.0–10.5)

## 2024-03-30 LAB — COMPREHENSIVE METABOLIC PANEL WITH GFR
ALT: 12 U/L (ref 0–35)
AST: 15 U/L (ref 0–37)
Albumin: 4 g/dL (ref 3.5–5.2)
Alkaline Phosphatase: 58 U/L (ref 39–117)
BUN: 22 mg/dL (ref 6–23)
CO2: 32 meq/L (ref 19–32)
Calcium: 9.1 mg/dL (ref 8.4–10.5)
Chloride: 104 meq/L (ref 96–112)
Creatinine, Ser: 0.95 mg/dL (ref 0.40–1.20)
GFR: 56.66 mL/min — ABNORMAL LOW (ref >60.00)
Glucose, Bld: 91 mg/dL (ref 70–99)
Potassium: 4 meq/L (ref 3.5–5.1)
Sodium: 142 meq/L (ref 135–145)
Total Bilirubin: 0.4 mg/dL (ref 0.2–1.2)
Total Protein: 6.9 g/dL (ref 6.0–8.3)

## 2024-03-30 LAB — BRAIN NATRIURETIC PEPTIDE: Pro B Natriuretic peptide (BNP): 51 pg/mL (ref 0.0–100.0)

## 2024-03-30 LAB — POCT INFLUENZA A/B
Influenza A, POC: NEGATIVE
Influenza B, POC: NEGATIVE

## 2024-03-30 MED ORDER — ALBUTEROL SULFATE (2.5 MG/3ML) 0.083% IN NEBU
2.5000 mg | INHALATION_SOLUTION | Freq: Once | RESPIRATORY_TRACT | Status: AC
  Administered 2024-03-30: 2.5 mg via RESPIRATORY_TRACT

## 2024-03-30 MED ORDER — PREDNISONE 20 MG PO TABS: ORAL_TABLET | ORAL | 0 refills | Status: DC

## 2024-03-30 MED ORDER — ALBUTEROL SULFATE HFA 108 (90 BASE) MCG/ACT IN AERS: 2.0000 | INHALATION_SPRAY | Freq: Four times a day (QID) | RESPIRATORY_TRACT | 0 refills | Status: AC | PRN

## 2024-03-30 NOTE — Assessment & Plan Note (Signed)
 Acute, negative COVID and flu testing in office today. Lung exam significant wheeze, oxygen saturation within normal range. Most likely viral upper respiratory tract infection but some concern for bacterial superinfection.  Patient has already started doxycycline  100 mg p.o. twice daily x 10 days.  Will evaluate with chest x-ray. Patient wheezing improved with albuterol  neb 2.5 mg x 1. Recommend prednisone  taper and continued albuterol  2 puffs every 4-6 hours as needed for wheeze.  Return and ER precautions provided.

## 2024-03-30 NOTE — Progress Notes (Signed)
 Patient ID: Monica Irwin, female    DOB: [birth year], 78 y.o.   MRN: [id number]  This visit was conducted in person.  BP 122/76   Pulse 64   Temp 98.2 F (36.8 C) (Oral)   Ht 5' 3.5 (1.613 m)   Wt 202 lb 4 oz (91.7 kg)   SpO2 94%   BMI 35.26 kg/m    CC:  Chief Complaint  Patient presents with   Cough    C/o cough and wheezing. Sxs started 03/26/24. Pt lives in facility.    Subjective:   HPI: Kaylee Trivett is a 78 y.o. female patient of Dr. Ubaldo with history of hypothyroidism, AV nodal reentry tachycardia, dementia and type 2 diabetes presenting on 03/30/2024 for Cough (C/o cough and wheezing. Sxs started 03/26/24. Pt lives in facility.)   Here with daughter Marval. Lives at [facility]. Date of onset: September 14, 4 days Initial symptoms included  cough, moist.  No clear ST, chest pain. Symptoms progressed to fatigue, sticking in bed more.  No fever.   Had episode of diarrhea in last week as well... gone now.. given other people at facility with similar felt food borne.   Not great appetite, trying to push fluids.  Not sure if nml UOP... wears a pull up.   Sick contacts:  cough issue, nothing specific COVID testing:   negative on day 2 of illness    Started  on doxy per PCP, started last night.. has had 2 dose so far.   Facility has tried to treat with  robitussin     No history of chronic lung disease such as asthma or COPD. Tends to have reactive airwys with infection. Non-smoker.      Lab Results  Component Value Date   HGBA1C 6.9 (H) 09/22/2023     Relevant past medical, surgical, family and social history reviewed and updated as indicated. Interim medical history since our last visit reviewed. Allergies and medications reviewed and updated. Outpatient Medications Prior to Visit  Medication Sig Dispense Refill   Calcium  Carbonate-Vitamin D  (CALTRATE 600+D PO) Take 2 tablets by mouth daily with supper.     doxycycline   (VIBRA -TABS) 100 MG tablet Take 1 tablet (100 mg total) by mouth 2 (two) times daily. 20 tablet 0   fluticasone  (FLONASE ) 50 MCG/ACT nasal spray Place 1 spray into both nostrils daily as needed for allergies or rhinitis.     ibuprofen (ADVIL,MOTRIN) 200 MG tablet Take 400 mg by mouth daily as needed for moderate pain.     levothyroxine  (SYNTHROID ) 100 MCG tablet TAKE 1 TABLET BY MOUTH ONCE DAILY 30 MINUTES BEFORE BREAKFAST 90 tablet 3   nystatin  cream (MYCOSTATIN ) Apply 1 Application topically 2 (two) times daily. 60 g 2   risperiDONE  (RISPERDAL ) 0.5 MG tablet TAKE 1 TABLET (0.5 MG TOTAL) BY MOUTH AT BEDTIME. 30 tablet 3   ciprofloxacin  (CIPRO ) 250 MG tablet Take 1 tablet (250 mg total) by mouth 2 (two) times daily. 14 tablet 0   No facility-administered medications prior to visit.     Per HPI unless specifically indicated in ROS section below Review of Systems  Constitutional:  Negative for fatigue and fever.  HENT:  Positive for congestion.   Eyes:  Negative for pain.  Respiratory:  Positive for cough and shortness of breath.   Cardiovascular:  Negative for chest pain, palpitations and leg swelling.  Gastrointestinal:  Negative for abdominal pain.  Genitourinary:  Negative for dysuria and vaginal bleeding.  Musculoskeletal:  Negative for back pain.  Neurological:  Negative for syncope, light-headedness and headaches.  Psychiatric/Behavioral:  Negative for dysphoric mood.    Objective:  BP 122/76   Pulse 64   Temp 98.2 F (36.8 C) (Oral)   Ht 5' 3.5 (1.613 m)   Wt 202 lb 4 oz (91.7 kg)   SpO2 94%   BMI 35.26 kg/m   Wt Readings from Last 3 Encounters:  03/30/24 202 lb 4 oz (91.7 kg)  10/13/23 194 lb 2 oz (88.1 kg)  08/11/23 206 lb (93.4 kg)      Physical Exam Constitutional:      General: She is not in acute distress.    Appearance: Normal appearance. She is well-developed. She is not ill-appearing or toxic-appearing.  HENT:     Head: Normocephalic.     Right Ear:  Hearing, tympanic membrane, ear canal and external ear normal. Tympanic membrane is not erythematous, retracted or bulging.     Left Ear: Hearing, tympanic membrane, ear canal and external ear normal. Tympanic membrane is not erythematous, retracted or bulging.     Nose: No mucosal edema or rhinorrhea.     Right Sinus: No maxillary sinus tenderness or frontal sinus tenderness.     Left Sinus: No maxillary sinus tenderness or frontal sinus tenderness.     Mouth/Throat:     Pharynx: Uvula midline.  Eyes:     General: Lids are normal. Lids are everted, no foreign bodies appreciated.     Conjunctiva/sclera: Conjunctivae normal.     Pupils: Pupils are equal, round, and reactive to light.  Neck:     Thyroid : No thyroid  mass or thyromegaly.     Vascular: No carotid bruit.     Trachea: Trachea normal.  Cardiovascular:     Rate and Rhythm: Normal rate and regular rhythm.     Pulses: Normal pulses.     Heart sounds: Normal heart sounds, S1 normal and S2 normal. No murmur heard.    No friction rub. No gallop.  Pulmonary:     Effort: Pulmonary effort is normal. No tachypnea or respiratory distress.     Breath sounds: Examination of the right-upper field reveals wheezing. Examination of the left-upper field reveals wheezing. Examination of the right-middle field reveals wheezing. Examination of the left-middle field reveals wheezing. Examination of the right-lower field reveals wheezing. Examination of the left-lower field reveals wheezing. Wheezing present. No decreased breath sounds, rhonchi or rales.  Abdominal:     General: Bowel sounds are normal.     Palpations: Abdomen is soft.     Tenderness: There is no abdominal tenderness.  Musculoskeletal:     Cervical back: Normal range of motion and neck supple.  Skin:    General: Skin is warm and dry.     Findings: No rash.  Neurological:     Mental Status: She is alert.  Psychiatric:        Mood and Affect: Mood is not anxious or depressed.         Speech: Speech normal.        Behavior: Behavior normal. Behavior is cooperative.        Thought Content: Thought content normal.        Judgment: Judgment normal.       Results for orders placed or performed in visit on 03/30/24  POCT Influenza A/B   Collection Time: 03/30/24 11:12 AM  Result Value Ref Range   Influenza A, POC Negative Negative   Influenza B, POC Negative Negative  POC COVID-19 BinaxNow   Collection Time: 03/30/24 11:13 AM  Result Value Ref Range   SARS Coronavirus 2 Ag Negative Negative    Assessment and Plan  Acute cough Assessment & Plan: Acute, negative COVID and flu testing in office today. Lung exam significant wheeze, oxygen saturation within normal range. Most likely viral upper respiratory tract infection but some concern for bacterial superinfection.  Patient has already started doxycycline  100 mg p.o. twice daily x 10 days.  Will evaluate with chest x-ray. Patient wheezing improved with albuterol  neb 2.5 mg x 1. Recommend prednisone  taper and continued albuterol  2 puffs every 4-6 hours as needed for wheeze.  Return and ER precautions provided.  Orders: -     POC COVID-19 BinaxNow -     POCT Influenza A/B -     DG Chest 2 View; Future -     Albuterol  Sulfate -     CBC with Differential/Platelet -     Comprehensive metabolic panel with GFR  SOB (shortness of breath) -     Brain natriuretic peptide  Mild intermittent reactive airway disease with acute exacerbation  Other orders -     predniSONE ; 3 tabs by mouth daily x 3 days, then 2 tabs by mouth daily x 2 days then 1 tab by mouth daily x 2 days  Dispense: 15 tablet; Refill: 0 -     Albuterol  Sulfate HFA; Inhale 2 puffs into the lungs every 6 (six) hours as needed for wheezing or shortness of breath.  Dispense: 8 g; Refill: 0    No follow-ups on file.   Greig Ring, MD

## 2024-05-01 ENCOUNTER — Encounter: Payer: Self-pay | Admitting: Family Medicine

## 2024-05-04 ENCOUNTER — Encounter: Payer: Self-pay | Admitting: Family Medicine

## 2024-05-11 ENCOUNTER — Ambulatory Visit

## 2024-05-11 NOTE — Progress Notes (Unsigned)
 Patient is not with POA at the moment they will call back and reschedule. Erroneous Encounter  please disregard.

## 2024-05-22 ENCOUNTER — Telehealth: Payer: Self-pay

## 2024-05-22 NOTE — Telephone Encounter (Signed)
 SABRA

## 2024-05-22 NOTE — Telephone Encounter (Signed)
 I spoke with Randie med tech at [facility] and she said pt  did not go to facility on 05/21/24; Randie will get in touch with pts daughter and see if she wants pt to be seen. Randie said the raspiness is better but still coughing. Sending note to Dr Watt and [facility] pool

## 2024-05-23 ENCOUNTER — Other Ambulatory Visit: Payer: Self-pay | Admitting: Family Medicine

## 2024-05-26 MED ORDER — SULFAMETHOXAZOLE-TRIMETHOPRIM 800-160 MG PO TABS: 1.0000 | ORAL_TABLET | Freq: Two times a day (BID) | ORAL | 0 refills | Status: DC

## 2024-05-26 NOTE — Addendum Note (Signed)
 Addended by: WENDELL ARLAND RAMAN on: 05/26/2024 03:35 PM   Modules accepted: Orders

## 2024-05-29 ENCOUNTER — Ambulatory Visit (INDEPENDENT_AMBULATORY_CARE_PROVIDER_SITE_OTHER)

## 2024-05-29 VITALS — BP 122/76 | Ht 63.5 in | Wt 201.0 lb

## 2024-05-29 DIAGNOSIS — Z0000 Encounter for general adult medical examination without abnormal findings: Secondary | ICD-10-CM

## 2024-05-29 NOTE — Progress Notes (Signed)
 I connected with  Monica Irwin on 05/29/24 by a audio enabled telemedicine application and verified that I am speaking with the correct person using two identifiers.  Patient Location: Home  Provider Location: Home Office  Persons Participating in Visit: Patient assisted by Daughter/POA Marval Kays .  I discussed the limitations of evaluation and management by telemedicine. The patient expressed understanding and agreed to proceed.  Vital Signs: Because this visit was a virtual/telehealth visit, some criteria may be missing or patient reported. Any vitals not documented were not able to be obtained and vitals that have been documented are patient reported.   Because this visit was a virtual/telehealth visit,  certain criteria was not obtained, such a blood pressure, CBG if applicable, and timed get up and go. Any medications not marked as taking were not mentioned during the medication reconciliation part of the visit. Any vitals not documented were not able to be obtained due to this being a telehealth visit or patient was unable to self-report a recent blood pressure reading due to a lack of equipment at home via telehealth. Vitals that have been documented are verbally provided by the patient.   This visit was performed by a medical professional under my direct supervision. I was immediately available for consultation/collaboration. I have reviewed and agree with the Annual Wellness Visit documentation.  Chief Complaint  Patient presents with   Medicare Wellness     Subjective:   Monica Irwin is a 78 y.o. female who presents for a Medicare Annual Wellness Visit.  Allergies (verified) Patient has no known allergies.   History: Past Medical History:  Diagnosis Date   ASCUS of cervix with negative high risk HPV 05/2018   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   Dementia without behavioral disturbance (HCC)  06/10/2020   History of kidney stones    Hypothyroidism    Major depressive disorder, recurrent episode, in full remission 07/24/2010   Nephrolithiasis    Osteoporosis    Past Surgical History:  Procedure Laterality Date   BACK SURGERY     [phone]   ELECTROPHYSIOLOGIC STUDY N/A 07/22/2015   Procedure: SVT Ablation;  Surgeon: Will Gladis Norton, MD;  Location: [facility];  Service: Cardiovascular;  Laterality: N/A;   PARTIAL COLECTOMY  2005   SMALL INTESTINE SURGERY     THYROIDECTOMY  2008   TOTAL HIP ARTHROPLASTY Irwin 08/19/2017   Procedure: Irwin TOTAL HIP ARTHROPLASTY ANTERIOR APPROACH;  Surgeon: Fidel Rogue, MD;  Location: [facility];  Service: Orthopedics;  Laterality: Irwin;   Family History  Problem Relation Age of Onset   Stroke Mother    Alzheimer's disease Mother    Heart failure Brother    Alzheimer's disease Sister    Other Father        natural causes   Heart attack Brother    Colon cancer Other        nephew   Ulcerative colitis Sister        colon removed   Social History   Occupational History   Occupation: retired Advice Worker: RETIRED  Tobacco Use   Smoking status: Never   Smokeless tobacco: Never  Vaping Use   Vaping status: Never Used  Substance and Sexual Activity   Alcohol  use: No    Alcohol /week: 0.0 standard drinks of alcohol    Drug use: No   Sexual activity: Never   Tobacco Counseling Counseling given: Not Answered  SDOH Screenings   Food Insecurity: No Food Insecurity (05/29/2024)  Housing: Low Risk  (05/29/2024)  Transportation Needs: No Transportation Needs (05/29/2024)  Utilities: Not At Risk (05/29/2024)  Alcohol  Screen: Low Risk  (04/06/2023)  Depression (PHQ2-9): Low Risk  (05/29/2024)  Financial Resource Strain: High Risk (05/09/2024)  Physical Activity: Insufficiently Active (05/29/2024)  Social Connections: Socially Isolated (05/29/2024)  Stress: Patient Declined  (05/29/2024)  Tobacco Use: Low Risk  (05/29/2024)  Health Literacy: Adequate Health Literacy (05/29/2024)   See flowsheets for full screening details  Depression Screen PHQ 2 & 9 Depression Scale- Over the past 2 weeks, how often have you been bothered by any of the following problems? Little interest or pleasure in doing things: 0 Feeling down, depressed, or hopeless (PHQ Adolescent also includes...irritable): 0 PHQ-2 Total Score: 0 Trouble falling or staying asleep, or sleeping too much: 0 Feeling tired or having little energy: 0 Poor appetite or overeating (PHQ Adolescent also includes...weight loss): 0 Feeling bad about yourself - or that you are a failure or have let yourself or your family down: 0 Trouble concentrating on things, such as reading the newspaper or watching television (PHQ Adolescent also includes...like school work): 0 Moving or speaking so slowly that other people could have noticed. Or the opposite - being so fidgety or restless that you have been moving around a lot more than usual: 0 Thoughts that you would be better off dead, or of hurting yourself in some way: 0 PHQ-9 Total Score: 0 If you checked off any problems, how difficult have these problems made it for you to do your work, take care of things at home, or get along with other people?: Not difficult at all  Depression Treatment Depression Interventions/Treatment : EYV7-0 Score <4 Follow-up Not Indicated     Goals Addressed             This Visit's Progress    Patient Stated   On track    Get some weight off       Visit info / Clinical Intake: Medicare Wellness Visit Type:: Subsequent Annual Wellness Visit Persons participating in visit:: patient Medicare Wellness Visit Mode:: Telephone If telephone:: video declined Because this visit was a virtual/telehealth visit:: vitals recorded from last visit If Telephone or Video please confirm:: I connected with the patient using audio enabled  telemedicine application and verified that I am speaking with the correct person using two identifiers; I discussed the limitations of evaluation and management by telemedicine; The patient expressed understanding and agreed to proceed Patient Location:: home Provider Location:: home office Information given by:: patient Interpreter Needed?: No Pre-visit prep was completed: yes AWV questionnaire completed by patient prior to visit?: no Living arrangements:: in nursing facility Patient's Overall Health Status Rating: good Typical amount of pain: none Does pain affect daily life?: no Are you currently prescribed opioids?: no  Dietary Habits and Nutritional Risks How many meals a day?: 3 Eats fruit and vegetables daily?: yes Most meals are obtained by: having others provide food; eating out In the last 2 weeks, have you had any of the following?: unintentional weight gain (per daughter) Diabetic:: no  Functional Status Activities of Daily Living (to include ambulation/medication): (!) Dependent Feeding: Independent Dressing/Grooming: Dependent Bathing: Dependent Toileting: Dependent Transfer: Dependent Ambulation: Dependent Medication Administration: Dependent Is this a change from baseline?: Pre-admission baseline Home Management: Dependent Manage your own finances?: (!) no Primary transportation is: facility / other; family/friends Concerns about vision?: no *vision screening is required for WTM* Concerns about  hearing?: no  Fall Screening Falls in the past year?: 0 Number of falls in past year: 0 Was there an injury with Fall?: 0 Fall Risk Category Calculator: 0 Patient Fall Risk Level: Low Fall Risk  Fall Risk Patient at Risk for Falls Due to: No Fall Risks Fall risk Follow up: Falls evaluation completed; Falls prevention discussed  Home and Transportation Safety: All rugs have non-skid backing?: N/A, no rugs All stairs or steps have railings?: N/A, no stairs Grab  bars in the bathtub or shower?: yes Have non-skid surface in bathtub or shower?: yes Good home lighting?: yes Regular seat belt use?: yes Hospital stays in the last year:: no  Cognitive Assessment Difficulty concentrating, remembering, or making decisions? : yes Will 6CIT or Mini Cog be Completed: no 6CIT or Mini Cog Declined: patient has a diagnosis of dementia or cognitive impairment  Advance Directives (For Healthcare) Does Patient Have a Medical Advance Directive?: Yes Does patient want to make changes to medical advance directive?: No - Patient declined Type of Advance Directive: Healthcare Power of [place]; Living will; Out of facility DNR (pink MOST or yellow form) Copy of Healthcare Power of Attorney in Chart?: No - copy requested Copy of Living Will in Chart?: No - copy requested Out of facility DNR (pink MOST or yellow form) in Chart? (Ambulatory ONLY): No - copy requested  Reviewed/Updated  Reviewed/Updated: Reviewed All (Medical, Surgical, Family, Medications, Allergies, Care Teams, Patient Goals)        Objective:    Today's Vitals   05/29/24 1511  BP: 122/76  Weight: 201 lb (91.2 kg)  Height: 5' 3.5 (1.613 m)   Body mass index is 35.05 kg/m.  Current Medications (verified) Outpatient Encounter Medications as of 05/29/2024  Medication Sig   albuterol  (VENTOLIN  HFA) 108 (90 Base) MCG/ACT inhaler Inhale 2 puffs into the lungs every 6 (six) hours as needed for wheezing or shortness of breath.   Calcium  Carbonate-Vitamin D  (CALTRATE 600+D PO) Take 2 tablets by mouth daily with supper.   fluticasone  (FLONASE ) 50 MCG/ACT nasal spray Place 1 spray into both nostrils daily as needed for allergies or rhinitis.   ibuprofen (ADVIL,MOTRIN) 200 MG tablet Take 400 mg by mouth daily as needed for moderate pain.   levothyroxine  (SYNTHROID ) 100 MCG tablet TAKE ONE TABLET BY MOUTH EVERY DAY 30 MINUTES BEFORE BREAKFAST   nystatin  cream (MYCOSTATIN ) Apply 1 Application  topically 2 (two) times daily.   risperiDONE  (RISPERDAL ) 0.5 MG tablet TAKE 1 TABLET (0.5 MG TOTAL) BY MOUTH AT BEDTIME.   sulfamethoxazole -trimethoprim  (BACTRIM  DS) 800-160 MG tablet Take 1 tablet by mouth 2 (two) times daily.   doxycycline  (VIBRA -TABS) 100 MG tablet Take 1 tablet (100 mg total) by mouth 2 (two) times daily.   predniSONE  (DELTASONE ) 20 MG tablet 3 tabs by mouth daily x 3 days, then 2 tabs by mouth daily x 2 days then 1 tab by mouth daily x 2 days   No facility-administered encounter medications on file as of 05/29/2024.   Hearing/Vision screen Hearing Screening - Comments:: No difficulties Vision Screening - Comments:: No vision issues  Immunizations and Health Maintenance Health Maintenance  Topic Date Due   FOOT EXAM  Never done   OPHTHALMOLOGY EXAM  Never done   COVID-19 Vaccine (4 - 2025-26 season) 03/13/2024   HEMOGLOBIN A1C  03/24/2024   Medicare Annual Wellness (AWV)  04/05/2024   Diabetic kidney evaluation - Urine ACR  10/12/2024   Diabetic kidney evaluation - eGFR measurement  03/30/2025  DTaP/Tdap/Td (3 - Td or Tdap) 01/21/2027   Pneumococcal Vaccine: 50+ Years  Completed   Influenza Vaccine  Completed   DEXA SCAN  Completed   Zoster Vaccines- Shingrix  Completed   Meningococcal B Vaccine  Aged Out   Colonoscopy  Discontinued   Hepatitis C Screening  Discontinued        Assessment/Plan:  This is a routine wellness examination for Monica Irwin.  Patient Care Team: Watt Mirza, MD as PCP - General (Family Medicine) Cathlyn JAYSON Nikki Bobie FORBES, MD as Consulting Physician (Obstetrics and Gynecology)  I have personally reviewed and noted the following in the patient's chart:   Medical and social history Use of alcohol , tobacco or illicit drugs  Current medications and supplements including opioid prescriptions. Functional ability and status Nutritional status Physical activity Advanced directives List of other physicians Hospitalizations,  surgeries, and ER visits in previous 12 months Vitals Screenings to include cognitive, depression, and falls Referrals and appointments  No orders of the defined types were placed in this encounter.  In addition, I have reviewed and discussed with patient certain preventive protocols, quality metrics, and best practice recommendations. A written personalized care plan for preventive services as well as general preventive health recommendations were provided to patient.   Monica Irwin, [place]   05/29/2024   No follow-ups on file.  After Visit Summary: (MyChart) Due to this being a telephonic visit, the after visit summary with patients personalized plan was offered to patient via MyChart   Nurse Notes: nothing to report

## 2024-05-29 NOTE — Patient Instructions (Signed)
 Ms. Danziger,  Thank you for taking the time for your Medicare Wellness Visit. I appreciate your continued commitment to your health goals. Please review the care plan we discussed, and feel free to reach out if I can assist you further.  Please note that Annual Wellness Visits do not include a physical exam. Some assessments may be limited, especially if the visit was conducted virtually. If needed, we may recommend an in-person follow-up with your provider.  Ongoing Care Seeing your primary care provider every 3 to 6 months helps us  monitor your health and provide consistent, personalized care.   Referrals If a referral was made during today's visit and you haven't received any updates within two weeks, please contact the referred provider directly to check on the status.  Recommended Screenings:  Health Maintenance  Topic Date Due   Complete foot exam   Never done   Eye exam for diabetics  Never done   COVID-19 Vaccine (4 - 2025-26 season) 03/13/2024   Hemoglobin A1C  03/24/2024   Medicare Annual Wellness Visit  04/05/2024   Yearly kidney health urinalysis for diabetes  10/12/2024   Yearly kidney function blood test for diabetes  03/30/2025   DTaP/Tdap/Td vaccine (3 - Td or Tdap) 01/21/2027   Pneumococcal Vaccine for age over 80  Completed   Flu Shot  Completed   DEXA scan (bone density measurement)  Completed   Zoster (Shingles) Vaccine  Completed   Meningitis B Vaccine  Aged Out   Colon Cancer Screening  Discontinued   Hepatitis C Screening  Discontinued       05/29/2024    3:13 PM  Advanced Directives  Does Patient Have a Medical Advance Directive? Yes  Type of Estate Agent of [place];Living will;Out of facility DNR (pink MOST or yellow form)  Does patient want to make changes to medical advance directive? No - Patient declined  Copy of Healthcare Power of Attorney in Chart? No - copy requested    Vision: Annual vision screenings are recommended  for early detection of glaucoma, cataracts, and diabetic retinopathy. These exams can also reveal signs of chronic conditions such as diabetes and high blood pressure.  Dental: Annual dental screenings help detect early signs of oral cancer, gum disease, and other conditions linked to overall health, including heart disease and diabetes.  Please see the attached documents for additional preventive care recommendations.

## 2024-06-10 ENCOUNTER — Other Ambulatory Visit: Payer: Self-pay | Admitting: Family Medicine

## 2024-08-05 ENCOUNTER — Encounter: Payer: Self-pay | Admitting: Family Medicine

## 2024-08-10 ENCOUNTER — Ambulatory Visit: Payer: Self-pay

## 2024-08-10 NOTE — Telephone Encounter (Signed)
" °  FYI Only or Action Required?: Action required by provider: update on patient condition.  Patient was last seen in primary care on 03/30/2024 by Avelina Greig BRAVO, MD.  Called Nurse Triage reporting urinary symptoms (foul smelling urine and more tearful and emotional.  Symptoms began 07/29/24.  Interventions attempted: Rest, hydration, or home remedies.  Symptoms are: unchanged.  Triage Disposition: See Physician Within 24 Hours- daughter stated pt does not go to office - just wanted to update PCP.  Patient/caregiver understands and will follow disposition?: yes with modifications. Please send in any med to [facility]        Reason for Disposition  Bad or foul-smelling urine  Answer Assessment - Initial Assessment Questions 1. SYMPTOM: What's the main symptom you're concerned about? (e.g., frequency, incontinence)     Urine odor (strong smelling odor) 2. ONSET: When did the  odor   start?     07/30/23 3. PAIN: Is there any pain? If Yes, ask: How bad is it? (Scale: 1-10; mild, moderate, severe)     no 4. CAUSE: What do you think is causing the symptoms?     UTI  5. OTHER SYMPTOMS: Do you have any other symptoms? (e.g., blood in urine, fever, flank pain, pain with urination)     Tearful more than usual  Protocols used: Urinary Symptoms-A-AH  "

## 2024-08-10 NOTE — Telephone Encounter (Signed)
 1st attempt to reach patient, no answer-left voicemail, called 2x  Copied from CRM [id number]. Topic: Clinical - Medical Advice >> Aug 10, 2024  2:41 PM Terri G wrote: Reason for CRM: Adrien (patients daughter) is calling regarding mom , that she may have an uti. She's been having a strong smelling urine and no other symptoms reported. Callback number [phone]

## 2024-08-11 ENCOUNTER — Other Ambulatory Visit: Payer: Self-pay | Admitting: Family Medicine

## 2024-08-11 MED ORDER — SULFAMETHOXAZOLE-TRIMETHOPRIM 800-160 MG PO TABS: 1.0000 | ORAL_TABLET | Freq: Two times a day (BID) | ORAL | 0 refills | Status: AC

## 2024-08-11 NOTE — Telephone Encounter (Signed)
 Monica Irwin

## 2024-08-11 NOTE — Telephone Encounter (Signed)
 Added to triage note.  No further action needed at this time.

## 2024-08-11 NOTE — Addendum Note (Signed)
 Addended by: WATT MIRZA on: 08/11/2024 09:50 AM   Modules accepted: Orders

## 2024-08-11 NOTE — Telephone Encounter (Signed)
 I sent her in some Septra  to [facility].  Can you ask her daughter if there is any way they can do a urine culture at her facility?  Skilled nursing facilities should be able to do that.  I think she told me in the past that they cannot, but she has been to several facilities.
